# Patient Record
Sex: Female | Born: 1935 | Race: White | Hispanic: No | Smoking: Former smoker
Health system: Southern US, Community
[De-identification: ages and names within clinical notes are randomized; demographics above are authoritative.]

## PROBLEM LIST (undated history)

## (undated) DIAGNOSIS — E041 Nontoxic single thyroid nodule: Secondary | ICD-10-CM

## (undated) DIAGNOSIS — J45909 Unspecified asthma, uncomplicated: Secondary | ICD-10-CM

## (undated) DIAGNOSIS — M1990 Unspecified osteoarthritis, unspecified site: Secondary | ICD-10-CM

## (undated) DIAGNOSIS — K219 Gastro-esophageal reflux disease without esophagitis: Secondary | ICD-10-CM

## (undated) DIAGNOSIS — E039 Hypothyroidism, unspecified: Secondary | ICD-10-CM

## (undated) DIAGNOSIS — C801 Malignant (primary) neoplasm, unspecified: Secondary | ICD-10-CM

## (undated) HISTORY — PX: CATARACT EXTRACTION, BILATERAL: SHX1313

## (undated) HISTORY — PX: ABDOMINAL HYSTERECTOMY: SHX81

## (undated) HISTORY — PX: THYROIDECTOMY, PARTIAL: SHX18

## (undated) HISTORY — DX: Unspecified osteoarthritis, unspecified site: M19.90

---

## 1969-01-09 DIAGNOSIS — J189 Pneumonia, unspecified organism: Secondary | ICD-10-CM

## 1969-01-09 HISTORY — DX: Pneumonia, unspecified organism: J18.9

## 1975-01-10 HISTORY — PX: OTHER SURGICAL HISTORY: SHX169

## 2005-09-12 ENCOUNTER — Ambulatory Visit: Payer: Self-pay | Admitting: Gastroenterology

## 2008-01-08 ENCOUNTER — Ambulatory Visit: Payer: Self-pay | Admitting: Family Medicine

## 2009-07-21 IMAGING — CR DG LUMBAR SPINE 2-3V
1 series · 3 of 3 positions shown · non-contrast
Comparison: none

REASON FOR EXAM: back & right leg pain
COMMENTS:

[Series 2: view not recorded · 0.17mm/px · 3 of 3 slices shown]
[im 1/3]
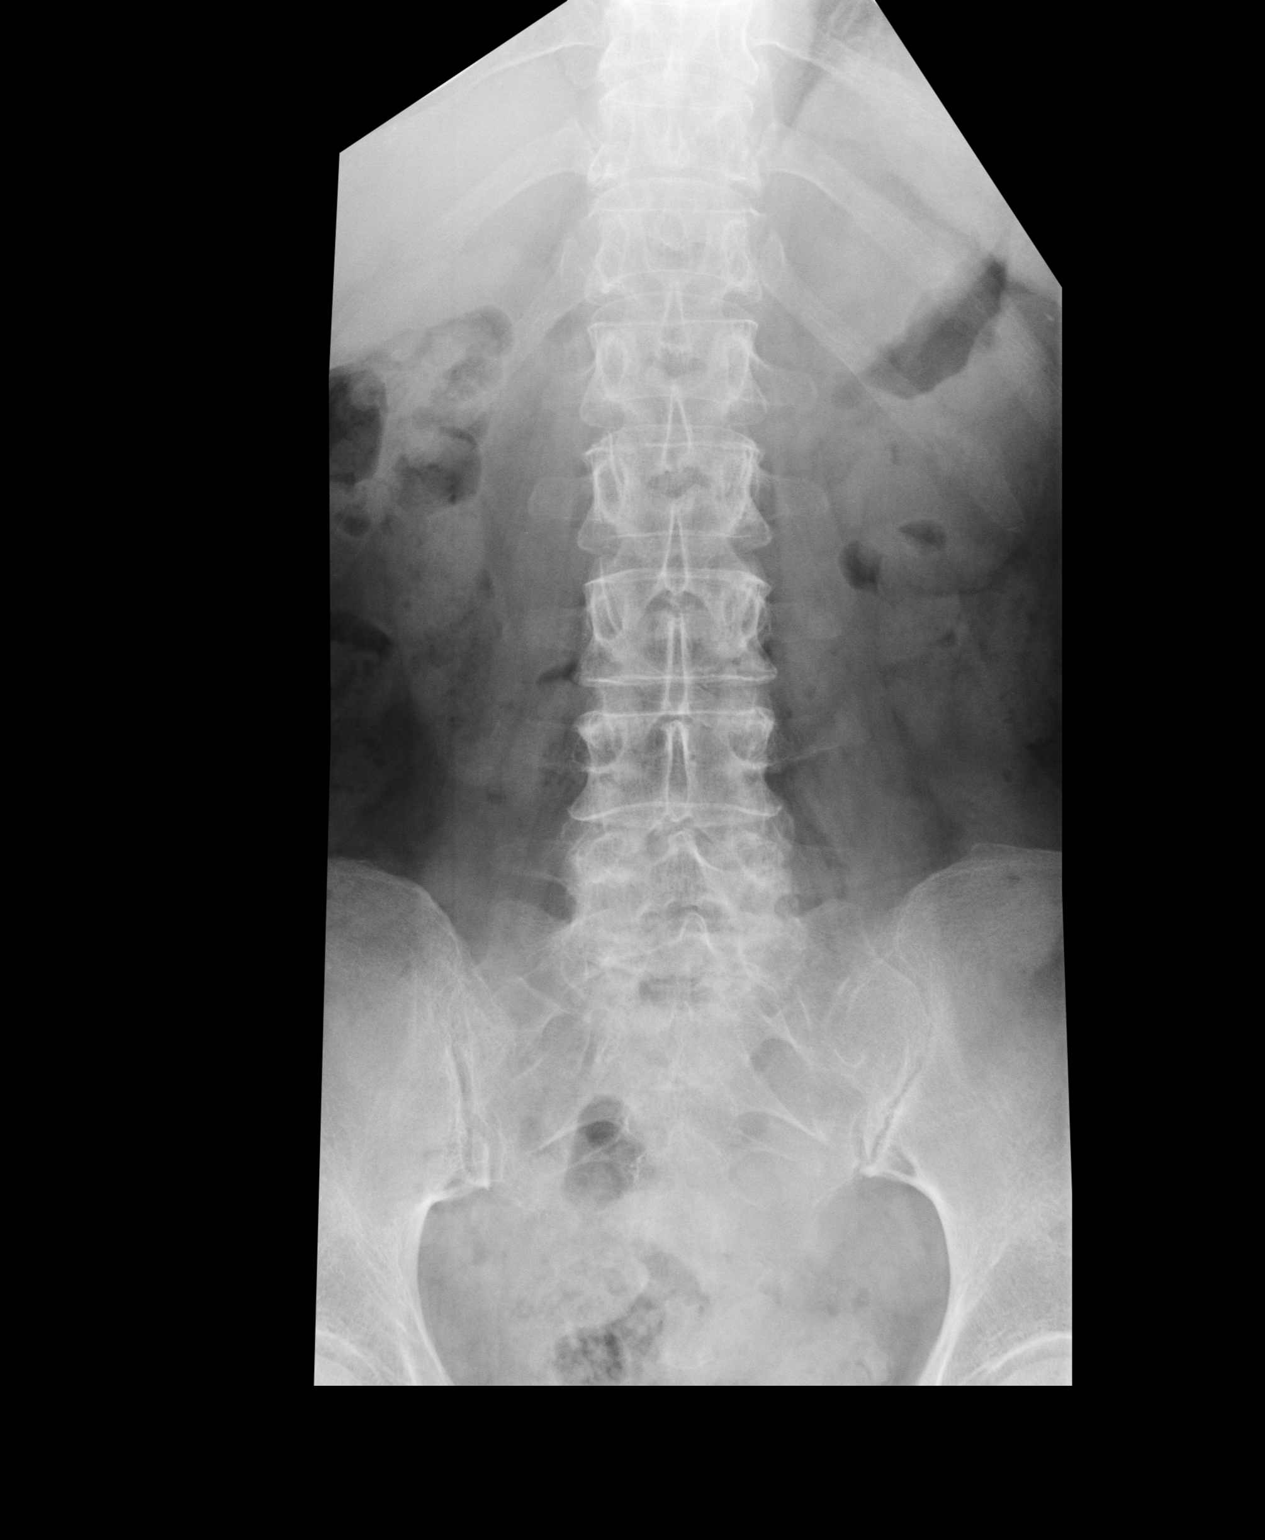
[im 2/3]
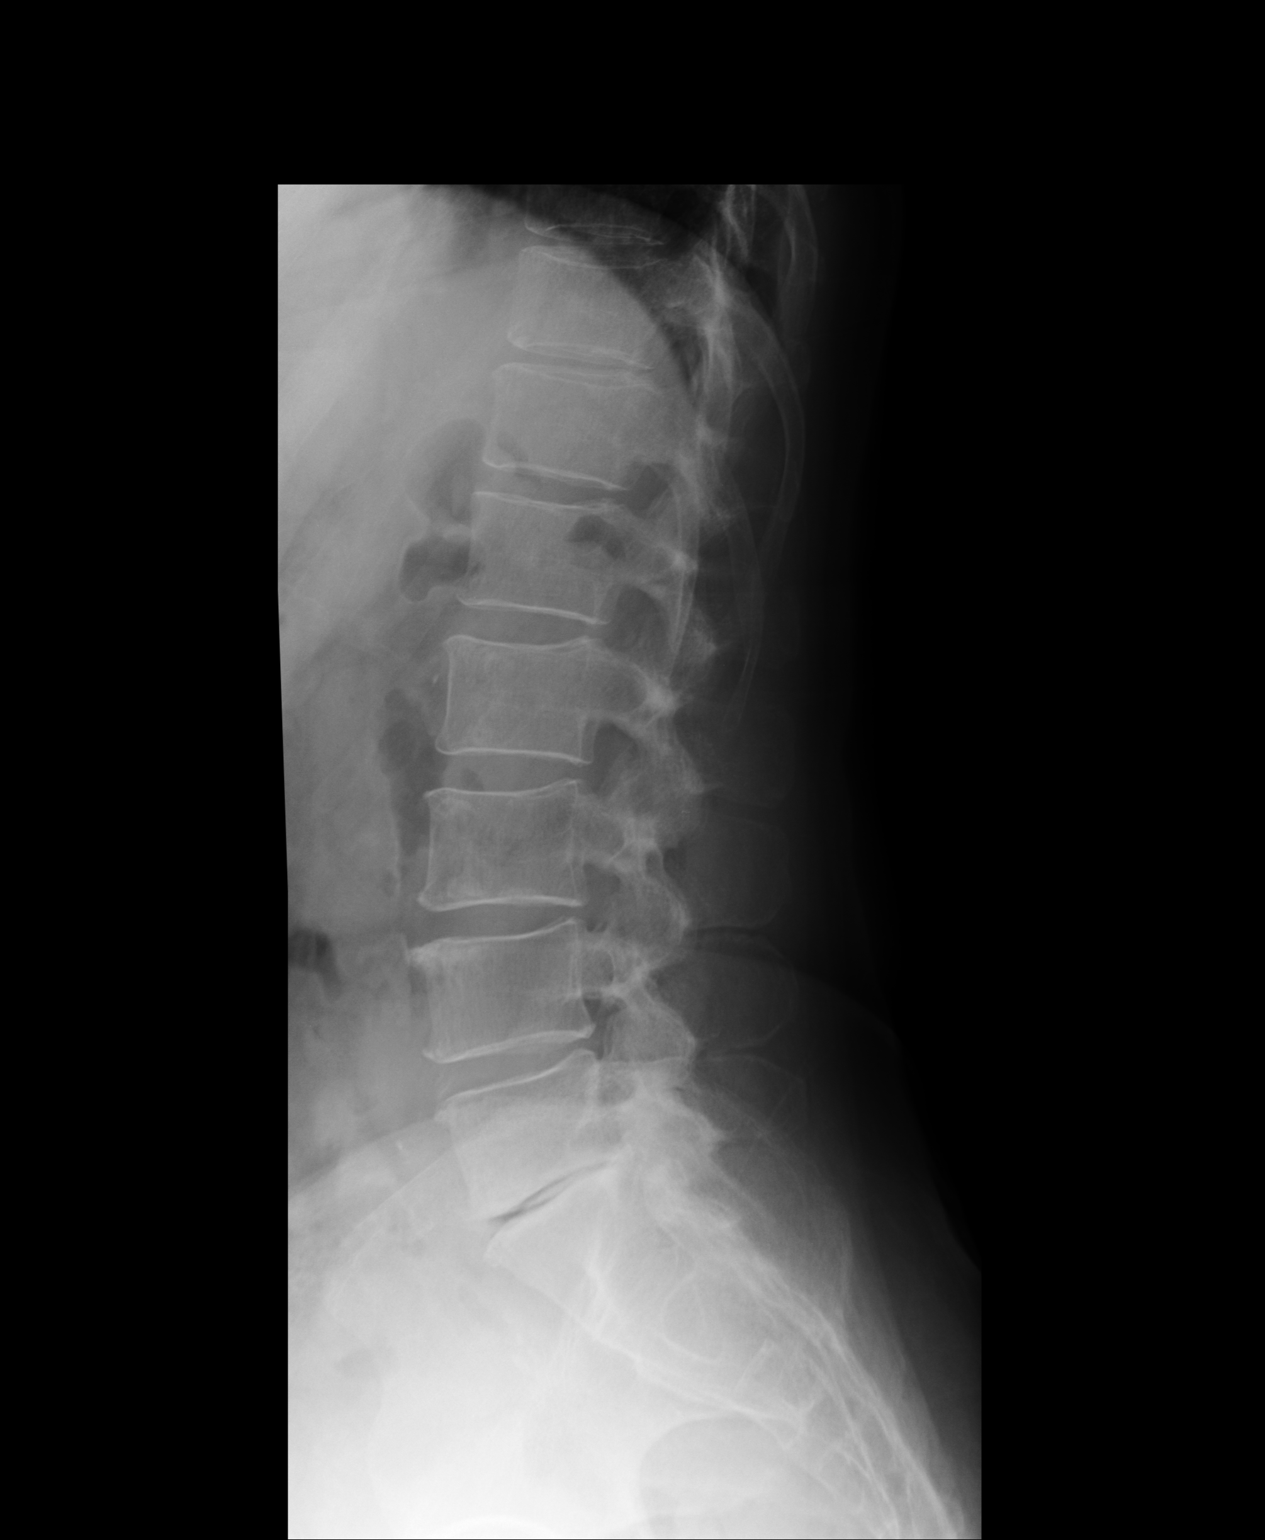
[im 3/3]
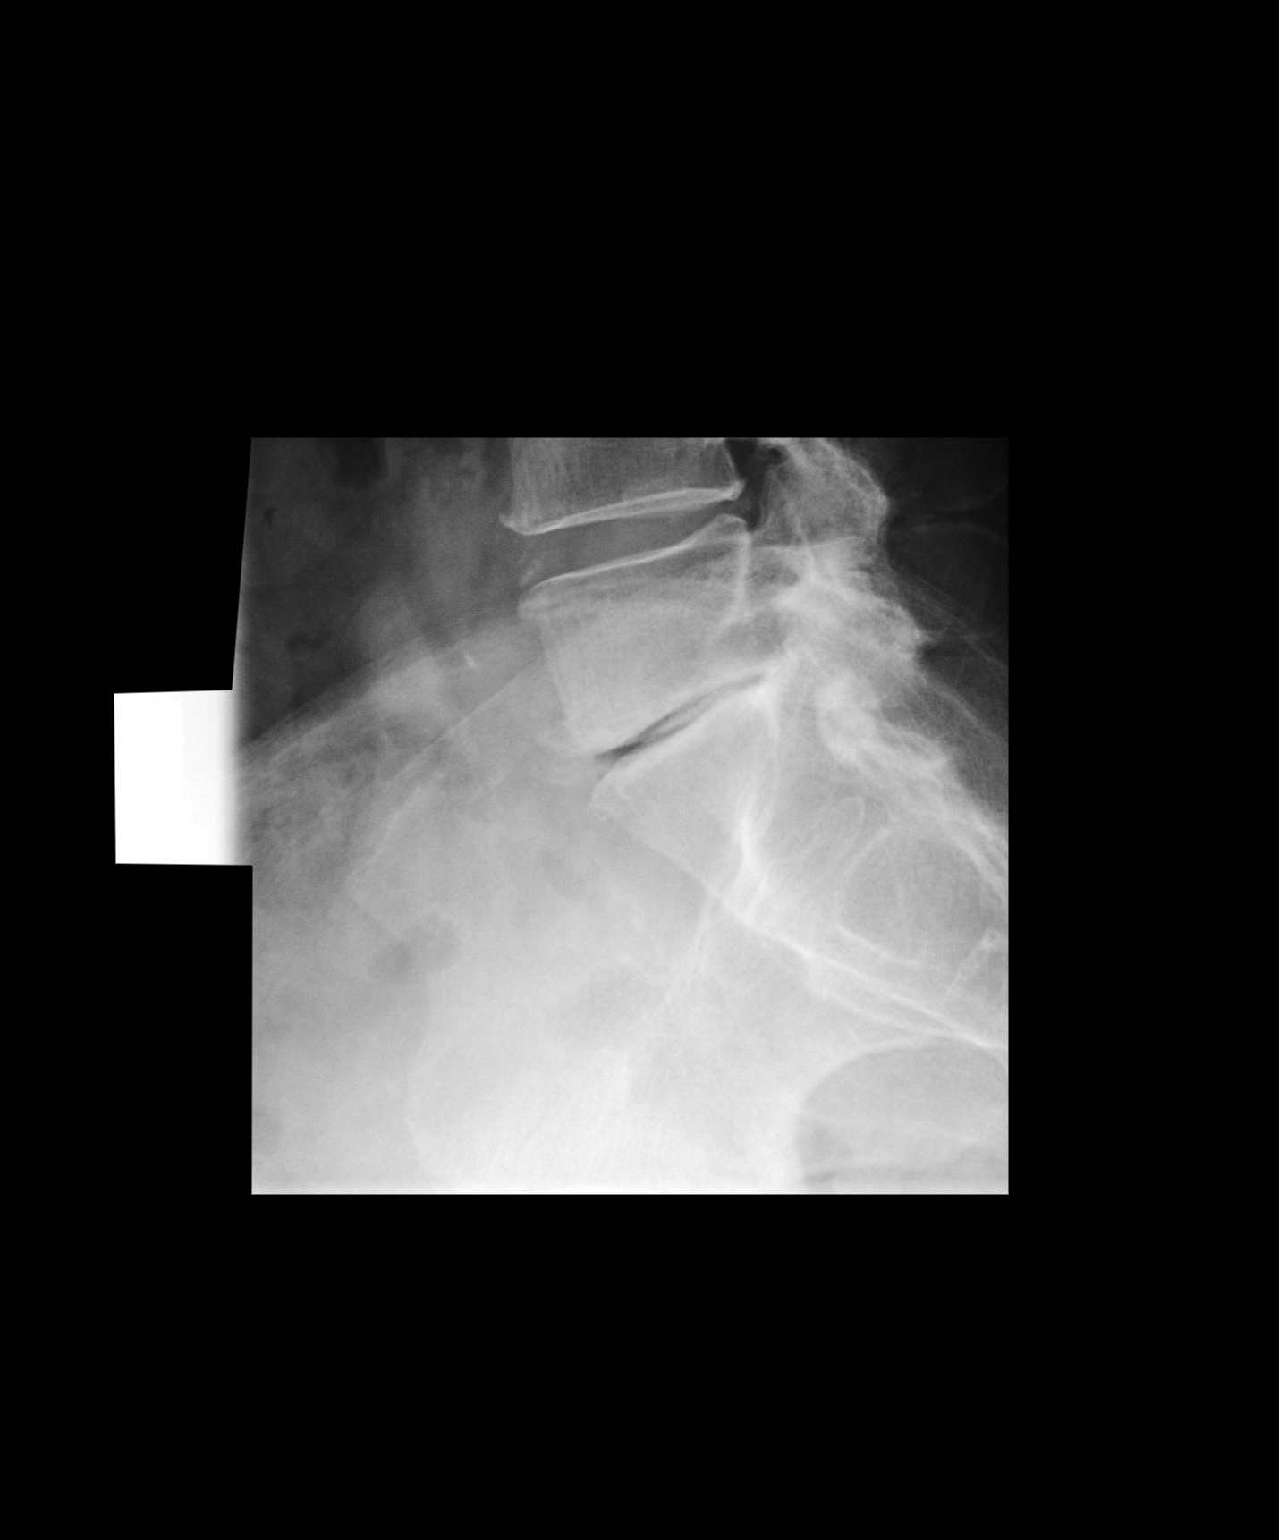

[3 of 3 positions shown; findings below may reference images not displayed]

PROCEDURE:     KDR - KDXR LUMBAR SPINE AP AND LATERAL  - January 08, 2008 [DATE]

RESULT:     The vertebral body heights are well maintained. The vertebral
body alignment is normal. There is narrowing of the L5-S1 intervertebral
disc space consistent with disc disease. This could be further evaluated by
MR if clinically indicated. There is mild degenerative spurring anteriorly
at multiple levels. The pedicles are bilaterally intact. No lytic or blastic
lesions are identified.
IMPRESSION: 1. No fracture is seen.
2. There are changes consistent with disc disease at L5-S1. This could be
further evaluated by MR if clinically indicated.

## 2014-05-26 DIAGNOSIS — I10 Essential (primary) hypertension: Secondary | ICD-10-CM | POA: Insufficient documentation

## 2018-08-19 ENCOUNTER — Other Ambulatory Visit: Payer: Self-pay

## 2018-08-19 ENCOUNTER — Ambulatory Visit
Admission: RE | Admit: 2018-08-19 | Discharge: 2018-08-19 | Disposition: A | Discharge status: Home / Self Care | Payer: Medicare Other | Source: Ambulatory Visit | Attending: Internal Medicine | Admitting: Internal Medicine

## 2018-08-19 ENCOUNTER — Other Ambulatory Visit: Payer: Self-pay | Admitting: Internal Medicine

## 2018-08-19 ENCOUNTER — Other Ambulatory Visit (HOSPITAL_COMMUNITY): Payer: Self-pay | Admitting: Internal Medicine

## 2018-08-19 DIAGNOSIS — R600 Localized edema: Secondary | ICD-10-CM

## 2019-02-11 ENCOUNTER — Other Ambulatory Visit: Payer: Self-pay | Admitting: Rheumatology

## 2019-02-11 DIAGNOSIS — E041 Nontoxic single thyroid nodule: Secondary | ICD-10-CM

## 2019-02-14 ENCOUNTER — Other Ambulatory Visit: Payer: Self-pay

## 2019-02-14 ENCOUNTER — Ambulatory Visit
Admission: RE | Admit: 2019-02-14 | Discharge: 2019-02-14 | Disposition: A | Discharge status: Home / Self Care | Payer: Medicare Other | Source: Ambulatory Visit | Attending: Rheumatology | Admitting: Rheumatology

## 2019-02-14 ENCOUNTER — Ambulatory Visit: Payer: Medicare Other

## 2019-02-14 DIAGNOSIS — E041 Nontoxic single thyroid nodule: Secondary | ICD-10-CM | POA: Insufficient documentation

## 2019-08-18 DIAGNOSIS — E782 Mixed hyperlipidemia: Secondary | ICD-10-CM | POA: Insufficient documentation

## 2019-08-18 DIAGNOSIS — M0579 Rheumatoid arthritis with rheumatoid factor of multiple sites without organ or systems involvement: Secondary | ICD-10-CM | POA: Insufficient documentation

## 2019-10-30 ENCOUNTER — Encounter: Payer: Self-pay | Admitting: General Surgery

## 2019-10-30 ENCOUNTER — Other Ambulatory Visit: Payer: Self-pay

## 2019-10-30 ENCOUNTER — Ambulatory Visit (INDEPENDENT_AMBULATORY_CARE_PROVIDER_SITE_OTHER): Payer: Medicare Other | Admitting: General Surgery

## 2019-10-30 VITALS — BP 133/83 | HR 101 | Temp 98.3°F | Resp 12 | Ht 61.0 in | Wt 118.0 lb

## 2019-10-30 DIAGNOSIS — E041 Nontoxic single thyroid nodule: Secondary | ICD-10-CM

## 2019-10-30 NOTE — Progress Notes (Signed)
Patient ID: [name], female   DOB: September 25, 1933, 86 y.o.   MRN: [id number]  Chief Complaint  Patient presents with  . New Patient (Initial Visit)    Thyroid    HPI [name] is a 86 y.o. female.  She has been referred by Dr. Gabriel Carina for surgical consideration of completion thyroidectomy.  She has a history of prior left thyroid lobectomy when she was in her 84s.  She reports that this was for a benign process.  Around January of this year, she had a Covid vaccine that she says caused a reaction resulting in arthritis.  She was seen by Dr. Jefm Bryant, who discovered a mass in the right lobe of her thyroid.  She had an ultrasound that describes 2 discrete thyroid nodules and a subsequent biopsy in March 2021.  One of the biopsies was benign, while the other showed a follicular neoplasm and Afirma gene expression classification suggested a 50% risk of malignancy.  At that time, Ms. Spaeth did not feel like she wanted to pursue surgery.  She had a recent thyroid ultrasound that demonstrated over 50% increase in size of the nodule.  This, in association with the prior biopsy results, or an indication for surgical management.  She denies any dysphagia or odynophagia.  No frequent throat clearing or pressure in her neck while in a supine position.  She denies any hoarseness or other voice changes, although to me, she does have a slightly breathy vocal quality.  She and the person accompanying her today states this is normal for her.  She denies any heat or cold intolerance.  No heart palpitations or hand tremors.  She denies any changes in the texture of her hair, skin, or fingernails.  No significant diarrhea or constipation.  No fatigue.  No significant weight loss or weight gain.  No family history of thyroid problems or thyroid cancer.  She denies any occupational or therapeutic exposure to head and neck irradiation.  She does not currently take any thyroid hormone replacement.   Past Medical History:   Diagnosis Date  . Arthritis     Past Surgical History:  Procedure Laterality Date  . ABDOMINAL HYSTERECTOMY    . THYROIDECTOMY, PARTIAL Left     History reviewed. No pertinent family history.  Social History Social History   Tobacco Use  . Smoking status: Former Research scientist (life sciences)  . Smokeless tobacco: Never Used  Substance Use Topics  . Alcohol use: Never  . Drug use: Never    Allergies  Allergen Reactions  . Indomethacin Other (See Comments) and Palpitations    Current Outpatient Medications  Medication Sig Dispense Refill  . ascorbic acid (VITAMIN C) 500 MG tablet Take by mouth.    . Calcium Carbonate-Vitamin D 600-200 MG-UNIT TABS Take by mouth.    . cyanocobalamin 1000 MCG tablet Take by mouth.    . folic acid (FOLVITE) 1 MG tablet Take by mouth.    . hydrochlorothiazide (HYDRODIURIL) 25 MG tablet Take 1 tablet by mouth daily.    . meloxicam (MOBIC) 15 MG tablet Take by mouth.    . methotrexate (RHEUMATREX) 2.5 MG tablet Take by mouth.    . Multiple Vitamin (MULTI-VITAMIN) tablet Take 1 tablet by mouth daily.     No current facility-administered medications for this visit.    Review of Systems Review of Systems  Cardiovascular: Positive for leg swelling.  All other systems reviewed and are negative. Or as discussed in the history of present illness.  Blood  pressure 133/83, pulse (!) 101, temperature 98.3 F (36.8 C), resp. rate 12, height '5\' 1"'  (1.549 m), weight 118 lb (53.5 kg), SpO2 95 %.  Physical Exam Physical Exam Vitals reviewed.  Constitutional:      General: She is not in acute distress.    Appearance: Normal appearance. She is normal weight.  HENT:     Head: Normocephalic and atraumatic.     Nose:     Comments: Covered with a mask    Mouth/Throat:     Comments: Covered with a mask Eyes:     General: No scleral icterus.       Right eye: No discharge.        Left eye: No discharge.     Comments: No proptosis or exophthalmos.  No lid lag or stare.   Neck:     Comments: She has a visible right sided thyroid mass.  On palpation, the contour is smooth, but the nodule is fairly firm.  It moves freely with deglutition.  The left lobe of the thyroid is surgically absent.  The trachea is midline and there is no palpable cervical or supraclavicular lymphadenopathy.  She has an old [place] thyroidectomy scar. Cardiovascular:     Rate and Rhythm: Normal rate and regular rhythm.  Pulmonary:     Effort: Pulmonary effort is normal.     Breath sounds: Normal breath sounds.  Abdominal:     General: Bowel sounds are normal.     Palpations: Abdomen is soft.  Genitourinary:    Comments: Deferred Musculoskeletal:        General: No deformity or signs of injury.  Skin:    General: Skin is warm and dry.  Neurological:     General: No focal deficit present.     Mental Status: She is alert and oriented to person, place, and time.     Data Reviewed I personally reviewed the ultrasound performed on February 14, 2019.  I concur with the radiologist report which is copied here:  CLINICAL DATA:  Nodule.  Remote history of left hemithyroidectomy.  EXAM: THYROID ULTRASOUND  TECHNIQUE: Ultrasound examination of the thyroid gland and adjacent soft tissues was performed.  COMPARISON:  None available  FINDINGS: Parenchymal Echotexture: Moderately heterogenous  Isthmus: 0.3 cm thickness  Right lobe: 5.5 x 2.1 x 2.5 cm  Left lobe: Surgically absent  _________________________________________________________  Estimated total number of nodules >/= 1 cm: 2  Number of spongiform nodules >/=  2 cm not described below (TR1): 0  Number of mixed cystic and solid nodules >/= 1.5 cm not described below (TR2): 0  _________________________________________________________  Nodule # 1:  Location: Right; Mid  Maximum size: 2.1 cm; Other 2 dimensions: 1.7 x 1.5 cm  Composition: mixed cystic and solid (1)  Echogenicity:  isoechoic (1)  Shape: not taller-than-wide (0)  Margins: smooth (0)  Echogenic foci: none (0)  ACR TI-RADS total points: 2.  ACR TI-RADS risk category: TR2 (2 points).  ACR TI-RADS recommendations:  This nodule does NOT meet TI-RADS criteria for biopsy or dedicated follow-up.  _________________________________________________________  Nodule # 2:  Location: Right; Inferior  Maximum size: 1.8 cm; Other 2 dimensions: 1.6 x 1.1 cm  Composition: solid/almost completely solid (2)  Echogenicity: isoechoic (1)  Shape: taller-than-wide (3)  Margins: ill-defined (0)  Echogenic foci: none (0)  ACR TI-RADS total points: 6.  ACR TI-RADS risk category: TR4 (4-6 points).  ACR TI-RADS recommendations:  **Given size (>/= 1.5 cm) and appearance, fine needle aspiration of  this moderately suspicious nodule should be considered based on TI-RADS criteria.  _________________________________________________________  0.7 cm spongiform nodule, superior right; This nodule does NOT meet TI-RADS criteria for biopsy or dedicated follow-up.  IMPRESSION: 1. No residual/recurrent tissue post left hemithyroidectomy. 2. Recommend FNA biopsy of moderately suspicious 1.8 cm inferior right nodule.  I was able to review the report of the more recent imaging, performed October 21, 2019.  I am unable to see the actual images.  The report is copied here: Indication: Multinodular goiter   Comparison: None   Technique: Gray-scale and color Doppler images of the neck were obtained.   Findings:  THYROID:  The right lobe of the thyroid measures 5.8 x 2.0 x 2.3 cm.  The left lobe of the thyroid is absent.  The isthmus measures 0.34 cm in AP depth.   Echotexture of the thyroid is homogeneous.   Within the right lobe, there are two nodules:  2.4 x 1.8 x 1.9 cm Mixed solid and cystic, no calcifications  1.6 x 1.4 x 1.4 cm Spongiform, no calcifications   There are no  significantly enlarged lymph nodes in the neck.   Impression:  Homogeneous right lobe of the thyroid gland. Surgically absent left lobe.   There are two right-sided thyroid nodules measuring up to 2.4 cm and 1.6  cm in greatest dimension. No prior imaging available for comparison here,  however consider comparison to outside reports.  Dr. Joycie Peek office forwarded the [facility] cytopathology and molecular diagnostic testing that was performed in March 2021.  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  BRAF mutation was negative and no RET/PTC mutations were detected.  Dr. Joycie Peek clinic notes were also forwarded and these were reviewed.  I concur with her recommendation for completion thyroidectomy.  Assessment This is an 86 year old woman with a remote history of prior left hemithyroidectomy.  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  Based upon the 50% risk of malignancy determined by molecular diagnostic testing, it has been recommended that she undergo completion thyroidectomy.  I concur with this recommendation and have offered her surgical intervention.  Plan The risks of thyroid surgery were discussed, including (but not limited to): bleeding, infection, damage to surrounding structures/tissues, injury (temporary or permanent) to the recurrent laryngeal nerve, hypoparathyroidism (temporary or permanent), need for thyroid hormone replacement therapy, need for additional surgery and/or treatment, recurrence of disease, tracheostomy (temporary or permanent).  The patient had the opportunity to ask any questions and these were answered to their satisfaction.  We will work on getting her operation scheduled.    Fredirick Maudlin 10/30/2019, 3:44 PM

## 2019-10-30 NOTE — H&P (View-Only) (Signed)
Patient ID: [name], female   DOB: [birth year], 87 y.o.   MRN: [id number]  Chief Complaint  Patient presents with  . New Patient (Initial Visit)    Thyroid    HPI [name] is a 86 y.o. female.  She has been referred by Dr. Gabriel Carina for surgical consideration of completion thyroidectomy.  She has a history of prior left thyroid lobectomy when she was in her 23s.  She reports that this was for a benign process.  Around January of this year, she had a Covid vaccine that she says caused a reaction resulting in arthritis.  She was seen by Dr. Jefm Bryant, who discovered a mass in the right lobe of her thyroid.  She had an ultrasound that describes 2 discrete thyroid nodules and a subsequent biopsy in March 2021.  One of the biopsies was benign, while the other showed a follicular neoplasm and Afirma gene expression classification suggested a 50% risk of malignancy.  At that time, [name] did not feel like she wanted to pursue surgery.  She had a recent thyroid ultrasound that demonstrated over 50% increase in size of the nodule.  This, in association with the prior biopsy results, or an indication for surgical management.  She denies any dysphagia or odynophagia.  No frequent throat clearing or pressure in her neck while in a supine position.  She denies any hoarseness or other voice changes, although to me, she does have a slightly breathy vocal quality.  She and the person accompanying her today states this is normal for her.  She denies any heat or cold intolerance.  No heart palpitations or hand tremors.  She denies any changes in the texture of her hair, skin, or fingernails.  No significant diarrhea or constipation.  No fatigue.  No significant weight loss or weight gain.  No family history of thyroid problems or thyroid cancer.  She denies any occupational or therapeutic exposure to head and neck irradiation.  She does not currently take any thyroid hormone replacement.   Past Medical History:   Diagnosis Date  . Arthritis     Past Surgical History:  Procedure Laterality Date  . ABDOMINAL HYSTERECTOMY    . THYROIDECTOMY, PARTIAL Left     History reviewed. No pertinent family history.  Social History Social History   Tobacco Use  . Smoking status: Former Research scientist (life sciences)  . Smokeless tobacco: Never Used  Substance Use Topics  . Alcohol use: Never  . Drug use: Never    Allergies  Allergen Reactions  . Indomethacin Other (See Comments) and Palpitations    Current Outpatient Medications  Medication Sig Dispense Refill  . ascorbic acid (VITAMIN C) 500 MG tablet Take by mouth.    . Calcium Carbonate-Vitamin D 600-200 MG-UNIT TABS Take by mouth.    . cyanocobalamin 1000 MCG tablet Take by mouth.    . folic acid (FOLVITE) 1 MG tablet Take by mouth.    . hydrochlorothiazide (HYDRODIURIL) 25 MG tablet Take 1 tablet by mouth daily.    . meloxicam (MOBIC) 15 MG tablet Take by mouth.    . methotrexate (RHEUMATREX) 2.5 MG tablet Take by mouth.    . Multiple Vitamin (MULTI-VITAMIN) tablet Take 1 tablet by mouth daily.     No current facility-administered medications for this visit.    Review of Systems Review of Systems  Cardiovascular: Positive for leg swelling.  All other systems reviewed and are negative. Or as discussed in the history of present illness.  Blood  pressure 133/83, pulse (!) 101, temperature 98.3 F (36.8 C), resp. rate 12, height '5\' 1"'  (1.549 m), weight 118 lb (53.5 kg), SpO2 95 %.  Physical Exam Physical Exam Vitals reviewed.  Constitutional:      General: She is not in acute distress.    Appearance: Normal appearance. She is normal weight.  HENT:     Head: Normocephalic and atraumatic.     Nose:     Comments: Covered with a mask    Mouth/Throat:     Comments: Covered with a mask Eyes:     General: No scleral icterus.       Right eye: No discharge.        Left eye: No discharge.     Comments: No proptosis or exophthalmos.  No lid lag or stare.   Neck:     Comments: She has a visible right sided thyroid mass.  On palpation, the contour is smooth, but the nodule is fairly firm.  It moves freely with deglutition.  The left lobe of the thyroid is surgically absent.  The trachea is midline and there is no palpable cervical or supraclavicular lymphadenopathy.  She has an old [place] thyroidectomy scar. Cardiovascular:     Rate and Rhythm: Normal rate and regular rhythm.  Pulmonary:     Effort: Pulmonary effort is normal.     Breath sounds: Normal breath sounds.  Abdominal:     General: Bowel sounds are normal.     Palpations: Abdomen is soft.  Genitourinary:    Comments: Deferred Musculoskeletal:        General: No deformity or signs of injury.  Skin:    General: Skin is warm and dry.  Neurological:     General: No focal deficit present.     Mental Status: She is alert and oriented to person, place, and time.     Data Reviewed I personally reviewed the ultrasound performed on February 14, 2019.  I concur with the radiologist report which is copied here:  CLINICAL DATA:  Nodule.  Remote history of left hemithyroidectomy.  EXAM: THYROID ULTRASOUND  TECHNIQUE: Ultrasound examination of the thyroid gland and adjacent soft tissues was performed.  COMPARISON:  None available  FINDINGS: Parenchymal Echotexture: Moderately heterogenous  Isthmus: 0.3 cm thickness  Right lobe: 5.5 x 2.1 x 2.5 cm  Left lobe: Surgically absent  _________________________________________________________  Estimated total number of nodules >/= 1 cm: 2  Number of spongiform nodules >/=  2 cm not described below (TR1): 0  Number of mixed cystic and solid nodules >/= 1.5 cm not described below (TR2): 0  _________________________________________________________  Nodule # 1:  Location: Right; Mid  Maximum size: 2.1 cm; Other 2 dimensions: 1.7 x 1.5 cm  Composition: mixed cystic and solid (1)  Echogenicity:  isoechoic (1)  Shape: not taller-than-wide (0)  Margins: smooth (0)  Echogenic foci: none (0)  ACR TI-RADS total points: 2.  ACR TI-RADS risk category: TR2 (2 points).  ACR TI-RADS recommendations:  This nodule does NOT meet TI-RADS criteria for biopsy or dedicated follow-up.  _________________________________________________________  Nodule # 2:  Location: Right; Inferior  Maximum size: 1.8 cm; Other 2 dimensions: 1.6 x 1.1 cm  Composition: solid/almost completely solid (2)  Echogenicity: isoechoic (1)  Shape: taller-than-wide (3)  Margins: ill-defined (0)  Echogenic foci: none (0)  ACR TI-RADS total points: 6.  ACR TI-RADS risk category: TR4 (4-6 points).  ACR TI-RADS recommendations:  **Given size (>/= 1.5 cm) and appearance, fine needle aspiration of  this moderately suspicious nodule should be considered based on TI-RADS criteria.  _________________________________________________________  0.7 cm spongiform nodule, superior right; This nodule does NOT meet TI-RADS criteria for biopsy or dedicated follow-up.  IMPRESSION: 1. No residual/recurrent tissue post left hemithyroidectomy. 2. Recommend FNA biopsy of moderately suspicious 1.8 cm inferior right nodule.  I was able to review the report of the more recent imaging, performed October 21, 2019.  I am unable to see the actual images.  The report is copied here: Indication: Multinodular goiter   Comparison: None   Technique: Gray-scale and color Doppler images of the neck were obtained.   Findings:  THYROID:  The right lobe of the thyroid measures 5.8 x 2.0 x 2.3 cm.  The left lobe of the thyroid is absent.  The isthmus measures 0.34 cm in AP depth.   Echotexture of the thyroid is homogeneous.   Within the right lobe, there are two nodules:  2.4 x 1.8 x 1.9 cm Mixed solid and cystic, no calcifications  1.6 x 1.4 x 1.4 cm Spongiform, no calcifications   There are no  significantly enlarged lymph nodes in the neck.   Impression:  Homogeneous right lobe of the thyroid gland. Surgically absent left lobe.   There are two right-sided thyroid nodules measuring up to 2.4 cm and 1.6  cm in greatest dimension. No prior imaging available for comparison here,  however consider comparison to outside reports.  Dr. Joycie Peek office forwarded the [facility] cytopathology and molecular diagnostic testing that was performed in March 2021.  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  BRAF mutation was negative and no RET/PTC mutations were detected.  Dr. Joycie Peek clinic notes were also forwarded and these were reviewed.  I concur with her recommendation for completion thyroidectomy.  Assessment This is an 86 year old woman with a remote history of prior left hemithyroidectomy.  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  Based upon the 50% risk of malignancy determined by molecular diagnostic testing, it has been recommended that she undergo completion thyroidectomy.  I concur with this recommendation and have offered her surgical intervention.  Plan The risks of thyroid surgery were discussed, including (but not limited to): bleeding, infection, damage to surrounding structures/tissues, injury (temporary or permanent) to the recurrent laryngeal nerve, hypoparathyroidism (temporary or permanent), need for thyroid hormone replacement therapy, need for additional surgery and/or treatment, recurrence of disease, tracheostomy (temporary or permanent).  The patient had the opportunity to ask any questions and these were answered to their satisfaction.  We will work on getting her operation scheduled.    Fredirick Maudlin 10/30/2019, 3:44 PM

## 2019-10-30 NOTE — Patient Instructions (Signed)
Our surgery scheduler will contact you to schedule your surgery. Please have the [facility] Sheet available when she calls you.  Call the office if you have any questions or concerns.   Thyroidectomy A thyroidectomy is a surgery that is done to remove the thyroid gland. The thyroid is a butterfly-shaped gland that is located at the lower front of your neck. It produces thyroid hormone, which is a substance that helps to control certain body processes. You may have a:  Total thyroidectomy. All of your thyroid is removed.  Thyroid lobectomy. Part of your thyroid is removed. The amount of thyroid gland tissue that is removed during your surgery depends on the reason for the procedure. Reasons to have this procedure include treatment for:  Thyroid nodules.  Thyroid cancer.  Benign thyroid tumors.  Goiter.  Overactive thyroid gland (hyperthyroidism). There are two ways to do this procedure. Conventional, or open, thyroidectomy uses one large incision to remove the thyroid gland. This is the most common method. Endoscopic thyroidectomy, a less invasive method, uses a narrow tube with a light and camera (endoscope) to remove the gland. Tell a health care provider about:  Any allergies you have.  All medicines you are taking, including vitamins, herbs, eye drops, creams, and over-the-counter medicines.  Any problems you or family members have had with anesthetic medicines.  Any blood disorders you have.  Any surgeries you have had.  Any medical conditions you have.  Whether you are pregnant or may be pregnant. What are the risks? Generally, this is a safe procedure. However, problems may occur, including:  Damage to the parathyroid glands. These are located behind your thyroid gland. They maintain the calcium levels in the body. Damage may lead to: ? A decrease in parathyroid hormone levels (hypoparathyroidism). ? A decrease in calcium levels. This will make your nerves irritable and may  cause muscle spasms.  An increase in thyroid hormone.  Damage to the nerves of your voice box (larynx). This can be temporary or long-term (rare).  Hoarseness. This usually resolves in 24-48 hours.  Bleeding.  Infection. What happens before the procedure? Staying hydrated Follow instructions from your health care provider about hydration, which may include:  Up to 2 hours before the procedure - you may continue to drink clear liquids, such as water, clear fruit juice, black coffee, and plain tea. Eating and drinking restrictions Follow instructions from your health care provider about eating and drinking, which may include:  8 hours before the procedure - stop eating heavy meals or foods such as meat, fried foods, or fatty foods.  6 hours before the procedure - stop eating light meals or foods, such as toast or cereal.  6 hours before the procedure - stop drinking milk or drinks that contain milk.  2 hours before the procedure - stop drinking clear liquids. Medicines Ask your health care provider about:  Changing or stopping your regular medicines. This is especially important if you are taking diabetes medicines or blood thinners.  Taking medicines such as aspirin and ibuprofen. These medicines can thin your blood. Do not take these medicines unless your health care provider tells you to take them.  Taking over-the-counter medicines, vitamins, herbs, and supplements. General instructions  You may be asked to shower with a germ-killing soap.  Plan to have someone take you home from the hospital or clinic.  Plan to have a responsible adult care for you for at least 24 hours after you leave the hospital or clinic. This is  important. What happens during the procedure?  To reduce your risk of infection: ? Your health care team will wash or sanitize their hands. ? Hair may be removed from the surgical area. ? Your skin will be washed with soap.  An IV will be inserted into  one of your veins.  You will be given one or more of the following: ? A medicine to help you relax (sedative). ? A medicine to make you fall asleep (general anesthetic).  Your health care provider will perform your surgery using one of two methods: ? For open thyroidectomy, an incision will be made in your lower neck. Muscles in the area will be separated to reveal your thyroid gland. ? For endoscopic thyroidectomy, several small incisions will be made in your neck, chest, or armpit. An endoscopewill be inserted into an incision.  Your health care provider may monitor laryngeal nerve function during the procedure for safety reasons.  Part or all of your thyroid gland will be removed.  A tube (drain) may be placed at the incision site to drain blood and fluids that accumulate under the skin after the procedure. The drain may have to stay in place for a day or two after the procedure.  The incision will be closed with stitches (sutures).  A dressing will be placed over your incision. The procedure may vary among health care providers and hospitals. What happens after the procedure?  Your blood pressure, heart rate, breathing rate, and blood oxygen level will be monitored often until the medicines you were given have worn off.  You will be given pain medicine as needed.  Your provider will check your ability to talk and swallow after the procedure.  You will gradually start to drink liquids and have soft foods as tolerated.  You may have a blood test to check the level of calcium in your body.  If you had a drain put in during the procedure, it will usually be removed the next day. Summary  A thyroidectomy is a surgery that is done to remove the thyroid gland.  The procedure will be done in one of two ways: conventional, or open, thyroidectomy or endoscopic thyroidectomy.  Serious complications are rare.  Plan to have a responsible adult care for you for at least 24 hours after  you leave the hospital or clinic. This is important. This information is not intended to replace advice given to you by your health care provider. Make sure you discuss any questions you have with your health care provider. Document Revised: 12/08/2016 Document Reviewed: 10/31/2016 Elsevier Patient Education  2020 [employer].

## 2019-11-03 ENCOUNTER — Telehealth: Payer: Self-pay | Admitting: General Surgery

## 2019-11-03 NOTE — Telephone Encounter (Signed)
Incoming call from patient.   She is now informed of all dates regarding her surgery and voices understanding.

## 2019-11-03 NOTE — Telephone Encounter (Signed)
Outgoing call is made, left message for patient to call.  Please advise patient of Pre-Admission date/time, COVID Testing date and Surgery date.  Surgery Date: 11/28/19 Preadmission Testing Date: 11/18/19 (phone 1p-5p) Covid Testing Date: 11/26/19 - patient advised to go to the [place] ([place] between [place] patient needs to call at [phone], between 1-3:00pm the day before surgery, to find out what time to arrive for surgery.

## 2019-11-18 ENCOUNTER — Encounter
Admission: RE | Admit: 2019-11-18 | Discharge: 2019-11-18 | Disposition: A | Discharge status: Home / Self Care | Payer: Medicare Other | Source: Ambulatory Visit | Attending: General Surgery | Admitting: General Surgery

## 2019-11-18 ENCOUNTER — Other Ambulatory Visit: Payer: Self-pay

## 2019-11-18 DIAGNOSIS — Z01812 Encounter for preprocedural laboratory examination: Secondary | ICD-10-CM | POA: Diagnosis present

## 2019-11-18 HISTORY — DX: Unspecified asthma, uncomplicated: J45.909

## 2019-11-18 HISTORY — DX: Nontoxic single thyroid nodule: E04.1

## 2019-11-18 NOTE — Patient Instructions (Addendum)
Your procedure is scheduled on:11-28-19 FRIDAY Report to the Registration Desk on the 1st floor of the [facility]-Then proceed to the Surgery Desk on the 2nd floor To find out your arrival time, please call [phone] between 1PM - 3PM on:11-27-19 THURSDAY  REMEMBER: Instructions that are not followed completely may result in serious medical risk, up to and including death; or upon the discretion of your surgeon and anesthesiologist your surgery may need to be rescheduled.  Do not eat food after midnight the night before surgery.  No gum chewing, lozengers or hard candies.  You may however, drink CLEAR liquids up to 2 hours before you are scheduled to arrive for your surgery. Do not drink anything within 2 hours of your scheduled arrival time.  Clear liquids include: - water  - apple juice without pulp - gatorade (not RED, PURPLE, OR BLUE) - black coffee or tea (Do NOT add milk or creamers to the coffee or tea) Do NOT drink anything that is not on this list.   TAKE THESE MEDICATIONS THE MORNING OF SURGERY WITH A SIP OF WATER: -NONE  One week prior to surgery: Stop Anti-inflammatories (NSAIDS) such as MELOXICAM (MOBIC) Advil, Aleve, Ibuprofen, Motrin, Naproxen, Naprosyn and Aspirin based products such as Excedrin, Goodys Powder, BC Powder-OK TO TAKE TYLENOL IF NEEDED  Stop ANY OVER THE COUNTER supplements until after surgery-STOP YOUR TURMERIC AND VITAMIN C-YOU MAY RESUME AFTER SURGERY (However, you may continue taking Calcium-Vitamin D, Vitamin B, folic acid and multivitamin up until the day before surgery.)  No Alcohol for 24 hours before or after surgery.  No Smoking including e-cigarettes for 24 hours prior to surgery.  No chewable tobacco products for at least 6 hours prior to surgery.  No nicotine patches on the day of surgery.  Do not use any "recreational" drugs for at least a week prior to your surgery.  Please be advised that the combination of cocaine and  anesthesia may have negative outcomes, up to and including death. If you test positive for cocaine, your surgery will be cancelled.  On the morning of surgery brush your teeth with toothpaste and water, you may rinse your mouth with mouthwash if you wish. Do not swallow any toothpaste or mouthwash.  Do not wear jewelry, make-up, hairpins, clips or nail polish.  Do not wear lotions, powders, or perfumes.   Do not shave 48 hours prior to surgery.   Contact lenses, hearing aids and dentures may not be worn into surgery.  Do not bring valuables to the hospital. [facility] is not responsible for any missing/lost belongings or valuables.   Use CHG wipes as directed on instruction sheet.  Notify your doctor if there is any change in your medical condition (cold, fever, infection).  Wear comfortable clothing (specific to your surgery type) to the hospital.  Plan for stool softeners for home use; pain medications have a tendency to cause constipation. You can also help prevent constipation by eating foods high in fiber such as fruits and vegetables and drinking plenty of fluids as your diet allows.  After surgery, you can help prevent lung complications by doing breathing exercises.  Take deep breaths and cough every 1-2 hours. Your doctor may order a device called an Incentive Spirometer to help you take deep breaths. When coughing or sneezing, hold a pillow firmly against your incision with both hands. This is called "splinting." Doing this helps protect your incision. It also decreases belly discomfort.  If you are being admitted to  the hospital overnight, leave your suitcase in the car. After surgery it may be brought to your room.  If you are being discharged the day of surgery, you will not be allowed to drive home. You will need a responsible adult (18 years or older) to drive you home and stay with you that night.   If you are taking public transportation, you will need to have a  responsible adult (18 years or older) with you. Please confirm with your physician that it is acceptable to use public transportation.   Please call the [place] [facility] at [phone] if you have any questions about these instructions.  Visitation Policy:  Patients undergoing a surgery or procedure may have one family member or support person with them as long as that person is not COVID-19 positive or experiencing its symptoms.  That person may remain in the waiting area during the procedure.  Inpatient Visitation Update:   In an effort to ensure the safety of our team members and our patients, we are implementing a change to our visitation policy:  Effective Monday, Aug. 9, at 7 a.m., inpatients will be allowed one support person.  o The support person may change daily.  o The support person must pass our screening, gel in and out, and wear a mask at all times, including in the patient's room.  o Patients must also wear a mask when staff or their support person are in the room.  o Masking is required regardless of vaccination status.  Systemwide, no visitors 17 or younger.

## 2019-11-18 NOTE — Pre-Procedure Instructions (Signed)
Care Everywhere Labs History Expand AllCollapse All CBC [place] 10/28/2019 Component    WBC (White Blood Cell Count)  RBC (Red Blood Cell Count)  Hemoglobin  Hematocrit  MCV (Mean Corpuscular Volume)  MCH (Mean Corpuscular Hemoglobin)  MCHC (Mean Corpuscular Hemoglobin Concentration)  RDW-CV (Red Cell Distribution Width)  MPV (Mean Platelet Volume)  Immature Granulocyte Count  Component 10/28/2019 07/22/2019 05/21/2019 03/10/2019 02/10/2019 02/07/2019 08/19/2018           WBC (White Blood Cell Count) 5.5 6.6 6.8 8.4 12.1High   7.2 5.1  RBC (Red Blood Cell Count) 4.22 4.06 4.13 4.31 4.33 4.26 4.57  Hemoglobin 13.9 13.2 13.2 13.2 13.1 12.9 14.5  Hematocrit 42.7 40.1 40.9 41.5 40.4 39.8 43.3  MCV (Mean Corpuscular Volume) 101.2High   98.8 99.0 96.3 93.3 93.4 94.7  MCH (Mean Corpuscular Hemoglobin) 32.9High   32.5High   32.0High   30.6 30.3 30.3 31.7High    MCHC (Mean Corpuscular Hemoglobin Concentration) 32.6 32.9 32.3 31.8Low   32.4 32.4 33.5  RDW-CV (Red Cell Distribution Width) 13.6 14.6 15.6High   14.5 12.3 12.6 12.8  MPV (Mean Platelet Volume) 10.3 10.3 10.5 11.0 10.4 9.9 10.6  Immature Granulocyte Count 0.02 0.02 0.01 0.03 0.15High   0.02 0.01  CHEM PROFILE [place] 10/28/2019 Component    Glucose  Sodium  Potassium  Chloride  Carbon Dioxide (CO2)  Urea Nitrogen (BUN)  Creatinine  Glomerular Filtration Rate (eGFR), MDRD Estimate  Calcium  AST   ALT   Alk Phos (alkaline Phosphatase)  Albumin  Bilirubin, Total  Protein, Total  A/G Ratio  Component 10/28/2019 10/28/2019 10/28/2019 10/28/2019 08/18/2019 07/22/2019 07/22/2019 07/22/2019 07/22/2019 05/21/2019 05/21/2019 05/21/2019 05/21/2019 03/10/2019 03/10/2019 03/10/2019 03/10/2019 02/10/2019 02/10/2019 02/10/2019 02/10/2019 08/19/2018                          Glucose -- -- -- -- 94 -- -- -- -- -- -- -- -- -- -- -- -- -- -- -- -- 95  Sodium --  -- -- -- 141 -- -- -- -- -- -- -- -- -- -- -- -- -- -- -- -- 141  Potassium -- -- -- -- 4.3 -- -- -- -- -- -- -- -- -- -- -- -- -- -- -- -- 4.5  Chloride -- -- -- -- 104 -- -- -- -- -- -- -- -- -- -- -- -- -- -- -- -- 105  Carbon Dioxide (CO2) -- -- -- -- 29.1 -- -- -- -- -- -- -- -- -- -- -- -- -- -- -- -- 30.5  Urea Nitrogen (BUN) -- -- -- -- 31High   -- -- -- -- -- -- -- -- -- -- -- -- -- -- -- -- 17  Creatinine -- -- 0.8 -- 0.8 -- -- 0.9 -- -- -- 0.9 -- -- -- 0.7 -- -- -- 0.7 -- 0.7  Glomerular Filtration Rate (eGFR), MDRD Estimate -- -- 68 -- 68 -- -- 60Low   -- -- -- 60Low   -- -- -- 80 -- -- -- 80 -- 80  Calcium -- -- -- -- 9.6 -- -- -- -- -- -- -- -- -- -- -- -- -- -- -- -- 9.4  AST  28 -- -- -- 20 22 -- -- -- 17 -- -- -- 17 -- -- -- 16 -- -- -- 23  ALT  -- 43High   -- -- 30 -- 33 -- -- -- 27 -- -- --  23 -- -- -- 21 -- -- 30  Alk Phos (alkaline Phosphatase) -- -- -- -- 80 -- -- -- -- -- -- -- -- -- -- -- -- -- -- -- -- 78  Albumin -- -- -- 4.1 3.7 -- -- -- 3.7 -- -- -- 3.8 -- -- -- 3.7 -- -- -- 3.7 3.9  Bilirubin, Total -- -- -- -- 0.3 -- -- -- -- -- -- -- -- -- -- -- -- -- -- -- -- 0.3  Protein, Total -- -- -- -- 6.1 -- -- -- -- -- -- -- -- -- -- -- -- -- -- -- -- 6.7  A/G Ratio -- -- -- -- 1.5 -- -- -- -- -- -- -- -- -- -- -- -- -- -- -- -- 1.4  DIFFERENTIAL [place] 10/28/2019 Component    Neutrophils  Lymphocytes  Monocytes  Eosinophils  Basophils  Neutrophil %  Lymphocyte %  Monocyte %  Eosinophil %  Basophil%  Immature Granulocyte %  Component 10/28/2019 07/22/2019 05/21/2019 03/10/2019 02/10/2019 02/07/2019 08/19/2018           Neutrophils 3.76 4.63 4.76 6.61 9.81High   5.10 3.30  Lymphocytes 1.03 1.01 1.14 1.27 1.13 1.27 1.18  Monocytes 0.34 0.52 0.56 0.38 0.93 0.51 0.35  Eosinophils 0.27 0.38 0.31 0.04 0.01 0.29 0.25  Basophils 0.04 0.05 0.05 0.04 0.03 0.02 0.05  Neutrophil % 68.9 70.0 69.8 78.9High   81.4High    70.7High   64.1  Lymphocyte % 18.9 15.3 16.7 15.2 9.4Low   17.6 23.0  Monocyte % 6.2 7.9 8.2 4.5 7.7 7.1 6.8  Eosinophil % 4.9 5.7High   4.5 0.5Low   0.1Low   4.0 4.9  Basophil% 0.7 0.8 0.7 0.5 0.2 0.3 1.0  Immature Granulocyte % 0.4 0.3 0.1 0.4

## 2019-11-21 ENCOUNTER — Encounter
Admission: RE | Admit: 2019-11-21 | Discharge: 2019-11-21 | Disposition: A | Discharge status: Home / Self Care | Payer: Medicare Other | Source: Ambulatory Visit | Attending: General Surgery | Admitting: General Surgery

## 2019-11-21 ENCOUNTER — Other Ambulatory Visit: Payer: Self-pay

## 2019-11-21 DIAGNOSIS — Z01812 Encounter for preprocedural laboratory examination: Secondary | ICD-10-CM | POA: Diagnosis not present

## 2019-11-21 LAB — POTASSIUM: Potassium: 3.5 mmol/L (ref 3.5–5.1)

## 2019-11-26 ENCOUNTER — Other Ambulatory Visit: Payer: Self-pay

## 2019-11-26 ENCOUNTER — Other Ambulatory Visit
Admission: RE | Admit: 2019-11-26 | Discharge: 2019-11-26 | Disposition: A | Discharge status: Home / Self Care | Payer: Medicare Other | Source: Ambulatory Visit | Attending: General Surgery | Admitting: General Surgery

## 2019-11-26 DIAGNOSIS — Z20822 Contact with and (suspected) exposure to covid-19: Secondary | ICD-10-CM | POA: Insufficient documentation

## 2019-11-26 DIAGNOSIS — Z01812 Encounter for preprocedural laboratory examination: Secondary | ICD-10-CM | POA: Insufficient documentation

## 2019-11-26 LAB — SARS CORONAVIRUS 2 (TAT 6-24 HRS): SARS Coronavirus 2: NEGATIVE

## 2019-11-27 MED ORDER — ORAL CARE MOUTH RINSE: 15.0000 mL | Freq: Once | OROMUCOSAL | Status: AC

## 2019-11-27 MED ORDER — CHLORHEXIDINE GLUCONATE 0.12 % MT SOLN: 15.0000 mL | Freq: Once | OROMUCOSAL | Status: AC

## 2019-11-27 MED ORDER — LACTATED RINGERS IV SOLN: INTRAVENOUS | Status: DC

## 2019-11-28 ENCOUNTER — Other Ambulatory Visit: Payer: Self-pay

## 2019-11-28 ENCOUNTER — Encounter
Admission: RE | Disposition: A | Discharge status: Home / Self Care | Payer: Self-pay | Source: Home / Self Care | Attending: General Surgery

## 2019-11-28 ENCOUNTER — Encounter: Payer: Self-pay | Admitting: General Surgery

## 2019-11-28 ENCOUNTER — Observation Stay
Admission: RE | Admit: 2019-11-28 | Discharge: 2019-11-29 | Disposition: A | Discharge status: Home / Self Care | Payer: Medicare Other | Attending: General Surgery | Admitting: General Surgery

## 2019-11-28 ENCOUNTER — Ambulatory Visit: Payer: Medicare Other | Admitting: Certified Registered"

## 2019-11-28 DIAGNOSIS — E041 Nontoxic single thyroid nodule: Secondary | ICD-10-CM | POA: Diagnosis not present

## 2019-11-28 DIAGNOSIS — Z79899 Other long term (current) drug therapy: Secondary | ICD-10-CM | POA: Diagnosis not present

## 2019-11-28 DIAGNOSIS — Z87891 Personal history of nicotine dependence: Secondary | ICD-10-CM | POA: Diagnosis not present

## 2019-11-28 DIAGNOSIS — E890 Postprocedural hypothyroidism: Secondary | ICD-10-CM | POA: Diagnosis not present

## 2019-11-28 LAB — CALCIUM: Calcium: 8.1 mg/dL — ABNORMAL LOW (ref 8.9–10.3)

## 2019-11-28 LAB — ALBUMIN: Albumin: 3.3 g/dL — ABNORMAL LOW (ref 3.5–5.0)

## 2019-11-28 SURGERY — THYROIDECTOMY, COMPLETION
Anesthesia: General | Laterality: Right

## 2019-11-28 MED ORDER — CHLORHEXIDINE GLUCONATE CLOTH 2 % EX PADS
6.0000 | MEDICATED_PAD | Freq: Once | CUTANEOUS | Status: AC
  Administered 2019-11-28: 6 via TOPICAL

## 2019-11-28 MED ORDER — TRAMADOL HCL 50 MG PO TABS: 50.0000 mg | ORAL_TABLET | Freq: Four times a day (QID) | ORAL | Status: DC | PRN

## 2019-11-28 MED ORDER — EPHEDRINE SULFATE 50 MG/ML IJ SOLN
INTRAMUSCULAR | Status: DC | PRN
  Administered 2019-11-28 (×2): 5 mg via INTRAVENOUS

## 2019-11-28 MED ORDER — TURMERIC 500 MG PO CAPS
1000.0000 mg | ORAL_CAPSULE | Freq: Every day | ORAL | Status: DC
Start: 2019-11-29 — End: 2019-11-28

## 2019-11-28 MED ORDER — ASCORBIC ACID 500 MG PO TABS
1000.0000 mg | ORAL_TABLET | Freq: Every day | ORAL | Status: DC
  Administered 2019-11-29: 1000 mg via ORAL
  Filled 2019-11-28: qty 2

## 2019-11-28 MED ORDER — CALCIUM CARBONATE 1250 (500 CA) MG PO TABS
500.0000 mg | ORAL_TABLET | Freq: Three times a day (TID) | ORAL | Status: DC
  Administered 2019-11-28 – 2019-11-29 (×2): 500 mg via ORAL
  Filled 2019-11-28 (×4): qty 1

## 2019-11-28 MED ORDER — ONDANSETRON HCL 4 MG/2ML IJ SOLN
INTRAMUSCULAR | Status: DC | PRN
  Administered 2019-11-28: 4 mg via INTRAVENOUS

## 2019-11-28 MED ORDER — PROPOFOL 10 MG/ML IV BOLUS
INTRAVENOUS | Status: DC | PRN
  Administered 2019-11-28: 80 mg via INTRAVENOUS

## 2019-11-28 MED ORDER — ONDANSETRON HCL 4 MG/2ML IJ SOLN
INTRAMUSCULAR | Status: AC
  Filled 2019-11-28: qty 4

## 2019-11-28 MED ORDER — FENTANYL CITRATE (PF) 100 MCG/2ML IJ SOLN
25.0000 ug | INTRAMUSCULAR | Status: DC | PRN
  Administered 2019-11-28: 25 ug via INTRAVENOUS

## 2019-11-28 MED ORDER — MENTHOL 3 MG MT LOZG
1.0000 | LOZENGE | OROMUCOSAL | Status: DC | PRN
  Filled 2019-11-28: qty 9

## 2019-11-28 MED ORDER — PROPOFOL 10 MG/ML IV BOLUS
INTRAVENOUS | Status: AC
  Filled 2019-11-28: qty 20

## 2019-11-28 MED ORDER — SUCCINYLCHOLINE CHLORIDE 200 MG/10ML IV SOSY
PREFILLED_SYRINGE | INTRAVENOUS | Status: AC
  Filled 2019-11-28: qty 20

## 2019-11-28 MED ORDER — CHLORHEXIDINE GLUCONATE CLOTH 2 % EX PADS: 6.0000 | MEDICATED_PAD | Freq: Once | CUTANEOUS | Status: DC

## 2019-11-28 MED ORDER — SODIUM CHLORIDE 0.9 % IV SOLN
INTRAVENOUS | Status: DC | PRN
  Administered 2019-11-28: .15 ug/kg/min via INTRAVENOUS
  Administered 2019-11-28: .1 ug/kg/min via INTRAVENOUS

## 2019-11-28 MED ORDER — LEVOTHYROXINE SODIUM 50 MCG PO TABS
75.0000 ug | ORAL_TABLET | Freq: Every day | ORAL | Status: DC
  Administered 2019-11-29: 75 ug via ORAL
  Filled 2019-11-28: qty 2

## 2019-11-28 MED ORDER — HYDROCHLOROTHIAZIDE 25 MG PO TABS
25.0000 mg | ORAL_TABLET | Freq: Every day | ORAL | Status: DC
  Administered 2019-11-29: 25 mg via ORAL
  Filled 2019-11-28: qty 1

## 2019-11-28 MED ORDER — GLYCOPYRROLATE 0.2 MG/ML IJ SOLN
INTRAMUSCULAR | Status: DC | PRN
  Administered 2019-11-28: .2 mg via INTRAVENOUS

## 2019-11-28 MED ORDER — LIDOCAINE HCL (CARDIAC) PF 100 MG/5ML IV SOSY
PREFILLED_SYRINGE | INTRAVENOUS | Status: DC | PRN
  Administered 2019-11-28: 60 mg via INTRAVENOUS

## 2019-11-28 MED ORDER — REMIFENTANIL HCL 1 MG IV SOLR
INTRAVENOUS | Status: AC
  Filled 2019-11-28: qty 1000

## 2019-11-28 MED ORDER — DEXMEDETOMIDINE (PRECEDEX) IN NS 20 MCG/5ML (4 MCG/ML) IV SYRINGE
PREFILLED_SYRINGE | INTRAVENOUS | Status: DC | PRN
  Administered 2019-11-28: 4 ug via INTRAVENOUS

## 2019-11-28 MED ORDER — ONDANSETRON HCL 4 MG/2ML IJ SOLN: 4.0000 mg | Freq: Four times a day (QID) | INTRAMUSCULAR | Status: DC | PRN

## 2019-11-28 MED ORDER — SUCCINYLCHOLINE CHLORIDE 20 MG/ML IJ SOLN
INTRAMUSCULAR | Status: DC | PRN
  Administered 2019-11-28: 80 mg via INTRAVENOUS

## 2019-11-28 MED ORDER — ADULT MULTIVITAMIN W/MINERALS CH
1.0000 | ORAL_TABLET | Freq: Every day | ORAL | Status: DC
  Administered 2019-11-29: 1 via ORAL
  Filled 2019-11-28: qty 1

## 2019-11-28 MED ORDER — FENTANYL CITRATE (PF) 100 MCG/2ML IJ SOLN
INTRAMUSCULAR | Status: DC | PRN
  Administered 2019-11-28: 50 ug via INTRAVENOUS
  Administered 2019-11-28: 25 ug via INTRAVENOUS

## 2019-11-28 MED ORDER — FENTANYL CITRATE (PF) 100 MCG/2ML IJ SOLN
INTRAMUSCULAR | Status: AC
  Filled 2019-11-28: qty 2

## 2019-11-28 MED ORDER — SODIUM CHLORIDE 0.9 % IV SOLN: INTRAVENOUS | Status: DC | PRN

## 2019-11-28 MED ORDER — DEXAMETHASONE SODIUM PHOSPHATE 10 MG/ML IJ SOLN
INTRAMUSCULAR | Status: AC
  Filled 2019-11-28: qty 1

## 2019-11-28 MED ORDER — ONDANSETRON 4 MG PO TBDP: 4.0000 mg | ORAL_TABLET | Freq: Four times a day (QID) | ORAL | Status: DC | PRN

## 2019-11-28 MED ORDER — GLYCOPYRROLATE 0.2 MG/ML IJ SOLN
INTRAMUSCULAR | Status: AC
  Filled 2019-11-28: qty 1

## 2019-11-28 MED ORDER — PHENYLEPHRINE HCL (PRESSORS) 10 MG/ML IV SOLN
INTRAVENOUS | Status: AC
  Filled 2019-11-28: qty 1

## 2019-11-28 MED ORDER — EPHEDRINE 5 MG/ML INJ
INTRAVENOUS | Status: AC
  Filled 2019-11-28: qty 10

## 2019-11-28 MED ORDER — CHLORHEXIDINE GLUCONATE 0.12 % MT SOLN
OROMUCOSAL | Status: AC
  Administered 2019-11-28: 15 mL via OROMUCOSAL
  Filled 2019-11-28: qty 15

## 2019-11-28 MED ORDER — MELOXICAM 7.5 MG PO TABS
15.0000 mg | ORAL_TABLET | Freq: Every day | ORAL | Status: DC
  Administered 2019-11-28 – 2019-11-29 (×2): 15 mg via ORAL
  Filled 2019-11-28 (×2): qty 2

## 2019-11-28 MED ORDER — ACETAMINOPHEN 10 MG/ML IV SOLN
INTRAVENOUS | Status: DC | PRN
  Administered 2019-11-28: 1000 mg via INTRAVENOUS

## 2019-11-28 MED ORDER — FENTANYL CITRATE (PF) 100 MCG/2ML IJ SOLN
INTRAMUSCULAR | Status: AC
  Administered 2019-11-28: 25 ug via INTRAVENOUS
  Filled 2019-11-28: qty 2

## 2019-11-28 MED ORDER — ACETAMINOPHEN 10 MG/ML IV SOLN
INTRAVENOUS | Status: AC
  Filled 2019-11-28: qty 100

## 2019-11-28 MED ORDER — SODIUM CHLORIDE 0.9 % IV SOLN
INTRAVENOUS | Status: DC | PRN
  Administered 2019-11-28: 25 ug/min via INTRAVENOUS
  Administered 2019-11-28: 30 ug/min via INTRAVENOUS

## 2019-11-28 MED ORDER — FOLIC ACID 1 MG PO TABS
1.0000 mg | ORAL_TABLET | Freq: Every day | ORAL | Status: DC
  Administered 2019-11-29: 1 mg via ORAL
  Filled 2019-11-28: qty 1

## 2019-11-28 MED ORDER — FAMOTIDINE 20 MG PO TABS
ORAL_TABLET | ORAL | Status: AC
  Administered 2019-11-28: 20 mg
  Filled 2019-11-28: qty 1

## 2019-11-28 MED ORDER — PHENYLEPHRINE HCL (PRESSORS) 10 MG/ML IV SOLN
INTRAVENOUS | Status: DC | PRN
  Administered 2019-11-28: 80 ug via INTRAVENOUS

## 2019-11-28 MED ORDER — DEXTROSE IN LACTATED RINGERS 5 % IV SOLN: INTRAVENOUS | Status: DC

## 2019-11-28 MED ORDER — ONDANSETRON HCL 4 MG/2ML IJ SOLN: 4.0000 mg | Freq: Once | INTRAMUSCULAR | Status: DC | PRN

## 2019-11-28 MED ORDER — LIDOCAINE HCL (PF) 2 % IJ SOLN
INTRAMUSCULAR | Status: AC
  Filled 2019-11-28: qty 5

## 2019-11-28 MED ORDER — ACETAMINOPHEN 500 MG PO TABS
1000.0000 mg | ORAL_TABLET | Freq: Four times a day (QID) | ORAL | Status: DC
  Administered 2019-11-28 – 2019-11-29 (×3): 1000 mg via ORAL
  Filled 2019-11-28 (×3): qty 2

## 2019-11-28 MED ORDER — VITAMIN B-12 1000 MCG PO TABS
5000.0000 ug | ORAL_TABLET | Freq: Every day | ORAL | Status: DC
  Administered 2019-11-29: 5000 ug via ORAL
  Filled 2019-11-28: qty 5

## 2019-11-28 MED ORDER — HEMOSTATIC AGENTS (NO CHARGE) OPTIME
TOPICAL | Status: DC | PRN
  Administered 2019-11-28: 1 via TOPICAL

## 2019-11-28 MED ORDER — DEXAMETHASONE SODIUM PHOSPHATE 10 MG/ML IJ SOLN
INTRAMUSCULAR | Status: DC | PRN
  Administered 2019-11-28: 10 mg via INTRAVENOUS

## 2019-11-28 SURGICAL SUPPLY — 46 items
BACTOSHIELD CHG 4% 4OZ (MISCELLANEOUS) ×1
BASIN GRAD PLASTIC 32OZ STRL (MISCELLANEOUS) ×2 IMPLANT
BLADE SURG 15 STRL LF DISP TIS (BLADE) ×1 IMPLANT
BLADE SURG 15 STRL SS (BLADE) ×1
CANISTER SUCT 1200ML W/VALVE (MISCELLANEOUS) ×2 IMPLANT
CLIP VESOCCLUDE SM WIDE 6/CT (CLIP) ×2 IMPLANT
COVER WAND RF STERILE (DRAPES) ×2 IMPLANT
DERMABOND ADVANCED (GAUZE/BANDAGES/DRESSINGS) ×1
DERMABOND ADVANCED .7 DNX12 (GAUZE/BANDAGES/DRESSINGS) ×1 IMPLANT
DRAPE MAG INST 16X20 L/F (DRAPES) ×2 IMPLANT
DRAPE THYROID T SHEET (DRAPES) ×2 IMPLANT
DRSG TEGADERM 2-3/8X2-3/4 SM (GAUZE/BANDAGES/DRESSINGS) ×2 IMPLANT
ELECT CAUTERY BLADE TIP 2.5 (TIP) ×2
ELECT LARYNGEAL DUAL CHAN (ELECTRODE) ×2 IMPLANT
ELECT NEEDLE 20X.3 GREEN (MISCELLANEOUS) ×2
ELECT REM PT RETURN 9FT ADLT (ELECTROSURGICAL) ×2
ELECTRODE CAUTERY BLDE TIP 2.5 (TIP) ×1 IMPLANT
ELECTRODE NEEDLE 20X.3 GREEN (MISCELLANEOUS) ×1 IMPLANT
ELECTRODE REM PT RTRN 9FT ADLT (ELECTROSURGICAL) ×1 IMPLANT
GAUZE 4X4 16PLY RFD (DISPOSABLE) IMPLANT
GLOVE BIO SURGEON STRL SZ 6.5 (GLOVE) ×4 IMPLANT
GLOVE INDICATOR 7.0 STRL GRN (GLOVE) ×2 IMPLANT
GOWN STRL REUS W/ TWL LRG LVL3 (GOWN DISPOSABLE) ×2 IMPLANT
GOWN STRL REUS W/TWL LRG LVL3 (GOWN DISPOSABLE) ×2
HEMOSTAT SNOW SURGICEL 2X4 (HEMOSTASIS) ×2 IMPLANT
KIT TURNOVER KIT A (KITS) ×2 IMPLANT
LABEL OR SOLS (LABEL) ×2 IMPLANT
MANIFOLD NEPTUNE II (INSTRUMENTS) ×2 IMPLANT
NERVE STIMULATOR WAVEFORM 16S (NEUROSURGERY SUPPLIES)
NS IRRIG 500ML POUR BTL (IV SOLUTION) ×2 IMPLANT
PACK BASIN MINOR (MISCELLANEOUS) ×2 IMPLANT
PROBE NEUROSIGN BIPOL (MISCELLANEOUS) ×1 IMPLANT
PROBE NEUROSIGN BIPOLAR (MISCELLANEOUS) ×1
SCRUB CHG 4% DYNA-HEX 4OZ (MISCELLANEOUS) ×1 IMPLANT
SET WALTER ACTIVATION W/DRAPE (SET/KITS/TRAYS/PACK) ×2 IMPLANT
SHEARS HARMONIC 9CM CVD (BLADE) IMPLANT
SPONGE KITTNER 5P (MISCELLANEOUS) ×2 IMPLANT
STIMULATOR NERVE WAVEFORM 16S (NEUROSURGERY SUPPLIES) IMPLANT
STRIP CLOSURE SKIN 1/2X4 (GAUZE/BANDAGES/DRESSINGS) ×2 IMPLANT
SUT MNCRL AB 4-0 PS2 18 (SUTURE) IMPLANT
SUT PROLENE 4 0 PS 2 18 (SUTURE) ×2 IMPLANT
SUT SILK 2 0 (SUTURE) ×1
SUT SILK 2-0 18XBRD TIE 12 (SUTURE) ×1 IMPLANT
SUT VIC AB 4-0 RB1 27 (SUTURE) ×1
SUT VIC AB 4-0 RB1 27X BRD (SUTURE) ×1 IMPLANT
SYR BULB IRRIG 60ML STRL (SYRINGE) ×2 IMPLANT

## 2019-11-28 NOTE — Op Note (Signed)
Operative Note  Preoperative Diagnosis:  a thyroid nodule  Postoperative Diagnosis:  a thyroid nodule  Operation:  Completion Thyroidectomy  Surgeon: Fredirick Maudlin, MD  Assistant: Ronny Bacon, MD (a second surgeon was necessary due to the technical challenge of the case)  Anesthesia: General endotracheal with nerve monitoring system  Findings: There was fairly dense scar along the midline of the trachea.  We also encountered several old silk sutures from her prior procedure.  The subplatysmal plane was also fairly scarred in.  The recurrent laryngeal nerve was clearly identified and give a functional signal with the nerve monitoring system.  Both parathyroid glands were preserved in situ.  Indications: This is an 86 year old woman who underwent a left thyroid lobectomy in the past.  She had a nodule in the right that has been followed over time.  It was biopsied and was indeterminate on cytology with Afirma suspicious, however at the time, she did not wish to undergo surgery.  In less than 6 months, however, it grew by greater than 50%, and as such she was referred for completion thyroidectomy.  The risks of procedure were discussed with her in detail and she agreed to proceed.  Procedure In Detail: The patient was identified in the preoperative holding area and brought to the operating room where she was placed supine on the OR table.  Bony prominences were padded and bilateral sequential compression devices were placed on the lower extremities.  General endotracheal anesthesia was induced using the nerve monitoring system.  Tube placement was verified with the McGrath laryngoscope.  The grounding lead was placed.  Patient was positioned appropriately for the operation and sterilely prepped and draped in standard fashion.  A timeout was performed confirming her identity, the procedure being performed, her allergies, all necessary equipment was available, and that maintenance anesthesia was  adequate.  The patient's prior surgical incision was placed directly over her clavicles, therefore we made a new 5 cm transverse incision midway between the sternal notch and thyroid cartilage.  This was carried down through the subcutaneous tissues and platysma using electrocautery.  We encountered some scar tissue and several old silk knots, concordant with her history of prior surgical intervention.  The subplatysmal flaps were elevated, again finding scar tissue in place.  The strap muscles were opened in the midline with additional scarring appreciated in this location.  Once we had dissected just lateral to the midline, underneath the strap muscles, no additional scarring was encountered.  The strap muscles were elevated off of the lobe and retracted laterally.  The superior pole vessels were isolated and divided with silk ties and the harmonic scalpel.  The superior parathyroid gland was identified and preserved on a good pedicle.  We continued to dissect muscle fibers and divide small vessels until the thyroid could be rotated medially.  We then dissected into the fibrofatty tissues of the central neck and identified the recurrent laryngeal nerve.  It was stimulated and gave an appropriate signal.  The trachea was exposed medial to the nerve and the inferior pole vessels were divided.  The inferior parathyroid gland was dissected off of the capsule of the thyroid and preserved on a pedicle.  The inferior pole vessels were divided.  The remaining attachments of the thyroid to the trachea were divided and the lobe was completely excised.  It was handed off as a specimen.  The wound was irrigated and hemostasis achieved.  A Valsalva maneuver from the anesthesia team confirmed no ongoing surgical bleeding.  We applied SNoW for additional hemostasis.  The strap muscles were closed in the midline with running 4-0 Vicryl and platysma was closed with interrupted Vicryl.  The skin was closed with running  subcuticular Prolene.  The skin was cleaned.  Dermabond and Steri-Strips were applied.  The Prolene suture was removed.  The patient was awakened, extubated, and taken to the postanesthesia care unit in good condition.  EBL: Less than 5 cc  IVF: See anesthesia record  Specimen(s): Right thyroid lobe to pathology  Complications: none immediately apparent.   Counts: all needles, instruments, and sponges were counted and reported to be correct in number at the end of the case.   I was present for and participated in the entire operation.  Fredirick Maudlin 12:57 PM

## 2019-11-28 NOTE — Anesthesia Postprocedure Evaluation (Signed)
Anesthesia Post Note  Patient: [name]  Procedure(s) Performed: COMPLETION OF THYROIDECTOMY (Right )  Patient location during evaluation: PACU Anesthesia Type: General Level of consciousness: awake and alert and oriented Pain management: pain level controlled Vital Signs Assessment: post-procedure vital signs reviewed and stable Respiratory status: spontaneous breathing, nonlabored ventilation and respiratory function stable Cardiovascular status: blood pressure returned to baseline and stable Postop Assessment: no signs of nausea or vomiting Anesthetic complications: no   No complications documented.   Last Vitals:  Vitals:   11/28/19 1423 11/28/19 1429  BP: 123/74   Pulse: 99 99  Resp: 17 16  Temp: 36.8 C   SpO2: 93% 96%    Last Pain:  Vitals:   11/28/19 1423  TempSrc:   PainSc: 2                  Kristell Wooding

## 2019-11-28 NOTE — Progress Notes (Signed)
PHARMACIST - PHYSICIAN ORDER COMMUNICATION  CONCERNING: P&T Medication Policy on Herbal Medications  DESCRIPTION:  This patient's order for:  turmeric  has been noted.  This product(s) is classified as an "herbal" or natural product. Due to a lack of definitive safety studies or FDA approval, nonstandard manufacturing practices, plus the potential risk of unknown drug-drug interactions while on inpatient medications, the Pharmacy and Therapeutics Committee does not permit the use of "herbal" or natural products of this type within [place].   ACTION TAKEN: The pharmacy department is unable to verify this order at this time and your patient has been informed of this safety policy. Please reevaluate patient's clinical condition at discharge and address if the herbal or natural product(s) should be resumed at that time.  

## 2019-11-28 NOTE — Transfer of Care (Signed)
Immediate Anesthesia Transfer of Care Note  Patient: [name]  Procedure(s) Performed: COMPLETION OF THYROIDECTOMY (Right )  Patient Location: PACU  Anesthesia Type:General  Level of Consciousness: awake, alert  and patient cooperative  Airway & Oxygen Therapy: Patient Spontanous Breathing and Patient connected to face mask oxygen  Post-op Assessment: Report given to RN and Post -op Vital signs reviewed and stable  Post vital signs: Reviewed and stable  Last Vitals:  Vitals Value Taken Time  BP 128/84 11/28/19 1253  Temp    Pulse 119 11/28/19 1257  Resp 13 11/28/19 1257  SpO2 99 % 11/28/19 1257  Vitals shown include unvalidated device data.  Last Pain:  Vitals:   11/28/19 0905  TempSrc: Oral  PainSc: 4          Complications: No complications documented.

## 2019-11-28 NOTE — Interval H&P Note (Signed)
History and Physical Interval Note:  11/28/2019 10:36 AM  [name]  has presented today for surgery, with the diagnosis of Suspicious right thyroid nodule.  The various methods of treatment have been discussed with the patient and family. After consideration of risks, benefits and other options for treatment, the patient has consented to  Procedure(s): COMPLETION OF THYROIDECTOMY (Right) as a surgical intervention.  The patient's history has been reviewed, patient examined, no change in status, stable for surgery.  I have reviewed the patient's chart and labs.  Questions were answered to the patient's satisfaction.     Fredirick Maudlin

## 2019-11-28 NOTE — Anesthesia Preprocedure Evaluation (Signed)
Anesthesia Evaluation  Patient identified by MRN, date of birth, ID band Patient awake    Reviewed: Allergy & Precautions, NPO status , Patient's Chart, lab work & pertinent test results  History of Anesthesia Complications Negative for: history of anesthetic complications  Airway Mallampati: III  TM Distance: >3 FB Neck ROM: Full    Dental  (+) Poor Dentition   Pulmonary neg sleep apnea, neg COPD, former smoker,    breath sounds clear to auscultation- rhonchi (-) wheezing      Cardiovascular hypertension, Pt. on medications (-) CAD, (-) Past MI, (-) Cardiac Stents and (-) CABG  Rhythm:Regular Rate:Normal - Systolic murmurs and - Diastolic murmurs    Neuro/Psych neg Seizures negative neurological ROS  negative psych ROS   GI/Hepatic negative GI ROS, Neg liver ROS,   Endo/Other  neg diabetesThyroid nodule   Renal/GU negative Renal ROS     Musculoskeletal  (+) Arthritis ,   Abdominal (+) - obese,   Peds  Hematology negative hematology ROS (+)   Anesthesia Other Findings Past Medical History: No date: Arthritis No date: Asthma     Comment:  as a child No date: Thyroid nodule   Reproductive/Obstetrics                             Anesthesia Physical Anesthesia Plan  ASA: II  Anesthesia Plan: General   Post-op Pain Management:    Induction: Intravenous  PONV Risk Score and Plan: 2 and Ondansetron and Dexamethasone  Airway Management Planned: Oral ETT  Additional Equipment:   Intra-op Plan:   Post-operative Plan: Extubation in OR  Informed Consent: I have reviewed the patients History and Physical, chart, labs and discussed the procedure including the risks, benefits and alternatives for the proposed anesthesia with the patient or authorized representative who has indicated his/her understanding and acceptance.     Dental advisory given  Plan Discussed with: CRNA and  Anesthesiologist  Anesthesia Plan Comments:         Anesthesia Quick Evaluation

## 2019-11-28 NOTE — Plan of Care (Signed)
Continuing with plan of care. 

## 2019-11-28 NOTE — Anesthesia Procedure Notes (Signed)
Procedure Name: Intubation Performed by: Fletcher-Harrison, Baldwin Racicot, CRNA Pre-anesthesia Checklist: Patient identified, Emergency Drugs available, Suction available and Patient being monitored Patient Re-evaluated:Patient Re-evaluated prior to induction Oxygen Delivery Method: Circle system utilized Preoxygenation: Pre-oxygenation with 100% oxygen Induction Type: IV induction Ventilation: Mask ventilation without difficulty Laryngoscope Size: McGraph and 3 Grade View: Grade I Tube type: Oral Tube size: 6.5 mm Number of attempts: 1 Airway Equipment and Method: Stylet and Oral airway Placement Confirmation: ETT inserted through vocal cords under direct vision,  positive ETCO2,  breath sounds checked- equal and bilateral and CO2 detector Secured at: 21 cm Tube secured with: Tape Dental Injury: Teeth and Oropharynx as per pre-operative assessment        

## 2019-11-29 DIAGNOSIS — E041 Nontoxic single thyroid nodule: Secondary | ICD-10-CM | POA: Diagnosis not present

## 2019-11-29 LAB — ALBUMIN: Albumin: 3 g/dL — ABNORMAL LOW (ref 3.5–5.0)

## 2019-11-29 LAB — CALCIUM: Calcium: 7.8 mg/dL — ABNORMAL LOW (ref 8.9–10.3)

## 2019-11-29 MED ORDER — CALCIUM CARBONATE-VITAMIN D 600-200 MG-UNIT PO CAPS: 1200.0000 mg | ORAL_CAPSULE | Freq: Every day | ORAL | 0 refills | Status: AC

## 2019-11-29 MED ORDER — LEVOTHYROXINE SODIUM 75 MCG PO TABS: 75.0000 ug | ORAL_TABLET | Freq: Every day | ORAL | 6 refills | Status: DC

## 2019-11-29 MED ORDER — CALCIUM CARBONATE 1250 (500 CA) MG PO TABS: 1.0000 | ORAL_TABLET | Freq: Three times a day (TID) | ORAL | 0 refills | Status: DC

## 2019-11-29 MED ORDER — TRAMADOL HCL 50 MG PO TABS: 50.0000 mg | ORAL_TABLET | Freq: Four times a day (QID) | ORAL | 0 refills | Status: DC | PRN

## 2019-11-29 MED ORDER — ACETAMINOPHEN 500 MG PO TABS: 1000.0000 mg | ORAL_TABLET | Freq: Four times a day (QID) | ORAL | 0 refills | Status: AC

## 2019-11-29 NOTE — Discharge Instructions (Signed)
Post-operative Home Care After Thyroid or Parathyroid Surgery  What should I expect after my operation?   Marland Kitchen You will see swelling under the incision and/or bruising under it in a few days. This is usually greatest on the second or third day after the operation. You may also feel the sensation of swelling and/or of firmness that can last for a month or more.    . Neck incisions heal rapidly, within a week or two. You can get them damp after about 24 hours, however do not submerge the incision or allow it to become soaked or saturated with water or sweat for 2 weeks.   . Your scar will be most visible 1-2 months after the operation and gradually fade over the next 6-8 months. As it heals, a scar looks more pink or red than the skin around it.   Dennis Bast may feel a firm 'healing ridge' directly under the incision. This is normal and will soften and go away when healing is complete within 3-6 months.   . All incisions are sensitive to sunlight.  For one year after surgery, you should use sunscreen when outdoors for long periods to prevent darkening of the scar area.    . We recommend that you not expose the incision to the ultraviolet lights used in tanning booths.    Will my neck hurt?  . Most patients experience very little pain, but you may feel some neck stiffness/soreness in your shoulder, back or neck and tension headache that takes a few days or weeks to go away completely.    . Some patients also notice minor changes in swallowing, which improve over time.   . The skin just above and below your incision will feel numb. This will improve over several months, but some persons may have a longterm decrease in sensation.   .  You may apply cold pack over your incision to improve the pain.    How will I manage my pain at home?  . Take NSAIDS like ibuprofen (Motrin, Advil), naproxen (Naprosyn, Aleve) or acetaminophen (Tylenol) every 6 hours for the first 3-5 days after operation. This will  minimize the pain you feel.      ? To prevent Tylenol overdose, do not take a Tylenol doses at the same time as a combination narcotic dose that contains Tylenol, like Norco and Percoocet. However, You may take them 4-6 hours apart.     . If you have sore/stiff muscles in your back, shoulder or neck, you may use moist warm heat or heating pad on the affected areas 15-20 minutes at a time several times a day.   . Gently massaging your neck muscles will improve the neck stiffness.    . Do not be afraid to move your neck. Gently flexing and stretching your neck muscles will prevent stiffness.    . Stronger pain medication or narcotic (like Norco or Percocet) for severe pain is rarely needed. If it is, however, DO NOT drive a car or drink alcohol while taking these medications.    . Narcotics also cause constipation. Stool softeners (Colace) and fiber (fruits, bran, vegetables) and extra fluid intake helps. A stimulant laxative (Milk of Magnesia, Senokot) may be needed as well.    Will my voice be affected?  Your voice may be hoarse or weak at first, because the surgery took place near the voice box, but usually recovers within weeks. Some patients also notice a change in the pitch of their voices that  affects singing. Rarely, these changes can be permanent.   Are there any diet restrictions?  No, return to your previous diet and always eat a well balanced diet, low in fat, etc.    How will I care for my incision?  . If you have paper "steri strips" on your incision, leave them in place until they begin to fall off naturally. If they become discolored or messy, you may remove them 7-10 days after your operation.   . If you have a skin glue (Dermabond) closure, you may notice tiny pieces of yellow material on your washcloth. Any sutures (stitches) you may have are dissolvable and do not need to be removed.  . You may shower then gently pat dry your incision.   . Do not apply ointments or  powders.   . Avoid using Vitamin E cream or other moisturizers on the incision until after your first follow-up visit.   What new medications might I take home?   ? Calcium supplement:  Your body's calcium levels may fall after a total thyroidectomy or parathyroid operation. We recommend you purchase Os-Cal 500 (one tablet equals 500 mg of elemental calcium) or Citracal (2 tablets equal 630 mg of elemental calcium; the "Petites" version contains 500 mg elemental calcium per 2 tablets). You may be taking 3-6 (or more) tablets per day, depending on your doctor's recommendation. You will need to take calcium at different times to avoid medication interaction. Ask your pharmacists, nurse, or doctor about specific interactions.   ? Thyroid Hormone:  If you have had a thyroid operation, you may be prescribed thyroid hormone replacement, called levothyroxine (Synthroid, Levothroid, etc.). A blood test will be done in 6-8 weeks to ensure the dosage is correct  by your doctor or your surgeon.   When can I go back to normal activities?  Marland Kitchen You may return to work in 5-7 days or sooner if desired. Contact the clinic coordinator if you need employer forms completed.   . You may drive as long as you are not taking any narcotics and your neck stiffness is resolved    Can I resume my previous medications?   . Yes, unless directed not to by your doctor.   . Before discharge, be sure to review your previous medications with your doctor or inpatient medical team.    When do I call for advice?   - If your temperature > 101.5  - You have trouble talking or breathing  - Your fingers/ hands or face or around your lips becomes numb and tingling. (This may mean your calcium level is low.) If this occurs, please chew 2-3 Tums every 30 minutes until it resolves.  If it has not resolved after 2 hours, please call our office.  - You have trouble swallowing  - Your incision becomes swollen, red or drainage occurs.

## 2019-11-29 NOTE — Discharge Summary (Signed)
Physician Discharge Summary  Patient ID: [name] MRN: [id number] DOB/AGE: 04-08-1933 86 y.o.  Admit date: 11/28/2019 Discharge date: 11/29/2019  Admission Diagnoses: Suspicious thyroid nodule  Discharge Diagnoses:  Active Problems:   Thyroid nodule   S/P partial thyroidectomy Completion thyroidectomy, pathology pending  Discharged Condition: good  Hospital Course: The patient was taken to the operating room on November 28, 2019 where she underwent completion thyroidectomy.  Her recovery was normal and she had good pain control with oral pain medications.  She was tolerating a diet.  She was felt suitable for discharge.  Consults: None  Significant Diagnostic Studies: None  Treatments: surgery: Completion thyroidectomy  Discharge Exam: Blood pressure 109/63, pulse 82, temperature 98.2 F (36.8 C), temperature source Oral, resp. rate 16, height 5\' 2"  (1.575 m), weight 53.5 kg, SpO2 98 %. General appearance: alert and no distress Resp: Normal work of breathing on room air Cardio: regular rate and rhythm Incision/Wound: Minor periincisional bruising, Steri-Strips intact.  Disposition: Discharge disposition: 01-Home or Self Care       Discharge Instructions    Call MD for:  difficulty breathing, headache or visual disturbances   Complete by: As directed    Call MD for:  extreme fatigue   Complete by: As directed    Call MD for:  hives   Complete by: As directed    Call MD for:  persistant dizziness or light-headedness   Complete by: As directed    Call MD for:  persistant nausea and vomiting   Complete by: As directed    Call MD for:  redness, tenderness, or signs of infection (pain, swelling, redness, odor or green/yellow discharge around incision site)   Complete by: As directed    Call MD for:  severe uncontrolled pain   Complete by: As directed    Call MD for:  temperature >100.4   Complete by: As directed    Diet - low sodium heart healthy   Complete by:  As directed    Driving Restrictions   Complete by: As directed    No driving for 1 week or while taking narcotic pain medications.   Increase activity slowly   Complete by: As directed    Leave dressing on - Keep it clean, dry, and intact until clinic visit   Complete by: As directed    You may shower.  Do not submerge the incision or allow it to become saturated with water or sweat.  It is okay if it gets a little bit damp; just pat it dry gently with a clean soft towel.  Allow the Steri-Strips (paper tapes) to follow-up on their own.     Allergies as of 11/29/2019      Reactions   Indomethacin Other (See Comments), Palpitations      Medication List    TAKE these medications   acetaminophen 500 MG tablet Commonly known as: TYLENOL Take 2 tablets (1,000 mg total) by mouth every 6 (six) hours.   ascorbic acid 1000 MG tablet Commonly known as: VITAMIN C Take 1,000 mg by mouth daily.   calcium carbonate 1250 (500 Ca) MG tablet Commonly known as: OS-CAL - dosed in mg of elemental calcium Take 1 tablet (500 mg of elemental calcium total) by mouth 3 (three) times daily with meals for 14 days.   Calcium Carbonate-Vitamin D 600-200 MG-UNIT Caps Take 1,200 mg by mouth daily. What changed:   medication strength  how much to take   Cyanocobalamin 5000 MCG Tbdp Take 5,000  mcg by mouth daily.   folic acid 1 MG tablet Commonly known as: FOLVITE Take 1 mg by mouth daily.   hydrochlorothiazide 25 MG tablet Commonly known as: HYDRODIURIL Take 25 mg by mouth daily.   levothyroxine 75 MCG tablet Commonly known as: SYNTHROID Take 1 tablet (75 mcg total) by mouth daily at 6 (six) AM. Start taking on: November 30, 2019   meloxicam 15 MG tablet Commonly known as: MOBIC Take 15 mg by mouth daily.   methotrexate 2.5 MG tablet Commonly known as: RHEUMATREX Take 15 mg by mouth every Thursday.   Multi-Vitamin tablet Take 1 tablet by mouth daily. Centrum   traMADol 50 MG  tablet Commonly known as: ULTRAM Take 1 tablet (50 mg total) by mouth every 6 (six) hours as needed for moderate pain or severe pain.   TURMERIC PO Take 1,000 mg by mouth daily.            Discharge Care Instructions  (From admission, onward)         Start     Ordered   11/29/19 0000  Leave dressing on - Keep it clean, dry, and intact until clinic visit       Comments: You may shower.  Do not submerge the incision or allow it to become saturated with water or sweat.  It is okay if it gets a little bit damp; just pat it dry gently with a clean soft towel.  Allow the Steri-Strips (paper tapes) to follow-up on their own.   11/29/19 0017          Follow-up Information    Fredirick Maudlin, MD. Schedule an appointment as soon as possible for a visit in 2 week(s).   Specialty: General Surgery Contact information: [address] [place] [address] [phone]               Signed: Fredirick Maudlin 11/29/2019, 8:22 AM

## 2019-11-29 NOTE — Plan of Care (Signed)
Discharge teaching completed with patient who is in stable condition. 

## 2019-12-05 LAB — SURGICAL PATHOLOGY

## 2019-12-09 ENCOUNTER — Encounter: Payer: Self-pay | Admitting: General Surgery

## 2019-12-09 ENCOUNTER — Other Ambulatory Visit: Payer: Self-pay

## 2019-12-09 ENCOUNTER — Ambulatory Visit (INDEPENDENT_AMBULATORY_CARE_PROVIDER_SITE_OTHER): Payer: Medicare Other | Admitting: General Surgery

## 2019-12-09 VITALS — BP 152/79 | HR 99 | Temp 97.8°F | Ht 61.0 in | Wt 119.0 lb

## 2019-12-09 DIAGNOSIS — C73 Malignant neoplasm of thyroid gland: Secondary | ICD-10-CM

## 2019-12-09 NOTE — Patient Instructions (Addendum)
Dr Celine Ahr discussed next recommended steps and answered all patient questions at today's visit. Patient informed to STOP taking the extra calcium Dr Celine Ahr prescribed. Patient advised to call and schedule follow up with Dr Gabriel Carina to check Thyroid Levels in 6-8 weeks.  Patient may try Vitamin E oil or Cocoa Butter to massage to help with swelling and healing of scar. Follow-up with our office as needed. Please call and ask to speak with a nurse if you develop questions or concerns.  GENERAL POST-OPERATIVE PATIENT INSTRUCTIONS   WOUND CARE INSTRUCTIONS:  Keep a dry clean dressing on the wound if there is drainage. The initial bandage may be removed after 24 hours.  Once the wound has quit draining you may leave it open to air.  If clothing rubs against the wound or causes irritation and the wound is not draining you may cover it with a dry dressing during the daytime.  Try to keep the wound dry and avoid ointments on the wound unless directed to do so.  If the wound becomes bright red and painful or starts to drain infected material that is not clear, please contact your physician immediately.  If the wound is mildly pink and has a thick firm ridge underneath it, this is normal, and is referred to as a healing ridge.  This will resolve over the next 4-6 weeks.  BATHING: You may shower if you have been informed of this by your surgeon. However, Please do not submerge in a tub, hot tub, or pool until incisions are completely sealed or have been told by your surgeon that you may do so.  DIET:  You may eat any foods that you can tolerate.  It is a good idea to eat a high fiber diet and take in plenty of fluids to prevent constipation.  If you do become constipated you may want to take a mild laxative or take ducolax tablets on a daily basis until your bowel habits are regular.  Constipation can be very uncomfortable, along with straining, after recent surgery.  ACTIVITY:  You are encouraged to cough and  deep breath or use your incentive spirometer if you were given one, every 15-30 minutes when awake.  This will help prevent respiratory complications and low grade fevers post-operatively if you had a general anesthetic.  You may want to hug a pillow when coughing and sneezing to add additional support to the surgical area, if you had abdominal or chest surgery, which will decrease pain during these times.  You are encouraged to walk and engage in light activity for the next two weeks.  You should not lift more than 20 pounds, until 4 to 6 weeks after surgery as it could put you at increased risk for complications.  Twenty pounds is roughly equivalent to a plastic bag of groceries. At that time- Listen to your body when lifting, if you have pain when lifting, stop and then try again in a few days. Soreness after doing exercises or activities of daily living is normal as you get back in to your normal routine.  MEDICATIONS:  Try to take narcotic medications and anti-inflammatory medications, such as tylenol, ibuprofen, naprosyn, etc., with food.  This will minimize stomach upset from the medication.  Should you develop nausea and vomiting from the pain medication, or develop a rash, please discontinue the medication and contact your physician.  You should not drive, make important decisions, or operate machinery when taking narcotic pain medication.  SUNBLOCK Use sun block  to incision area over the next year if this area will be exposed to sun. This helps decrease scarring and will allow you avoid a permanent darkened area over your incision.  QUESTIONS:  Please feel free to call our office if you have any questions, and we will be glad to assist you. [phone].

## 2019-12-09 NOTE — Progress Notes (Signed)
[name] is here today for a postoperative visit.  She is an 86 year old woman with a prior history of left thyroid lobectomy, now status post completion thyroidectomy on November 28, 2019.  Her final pathology demonstrated:  SURGICAL PATHOLOGY  CASE: [id number]  PATIENT: [name]  Surgical Pathology Report      Specimen Submitted:  A. Right thyroid lobe   Clinical History: Suspicious right thyroid nodule     DIAGNOSIS:  A. 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  - NO PARATHYROID TISSUE OR LYMPH NODES IDENTIFIED.  - SEE CANCER SUMMARY.   Comment:  Numerous additional deeper recut levels were examined to better evaluate  tissue margins. Although the gross impression indicates that the tumor  nodules are present 0.1 cm from the overlying inked capsule, sections  from block A1 show both tumor foci, without evidence of an intact  surrounding capsule for either nodule. Focal ink is present on tumor,  but likely represents carryover during the grossing process. Tumor does  not show evidence of cautery artifact. It is favored that the appearance  of the surgical margins in this section is artifactual in nature, and  the final surgical margins are negative for carcinoma. There is no  evidence of tumor involving strap muscles to suggest extrathyroidal  extension.   CANCER CASE SUMMARY: THYROID GLAND  Standard(s): AJCC-UICC 8   SPECIMEN  Procedure: Right lobectomy   TUMOR  Tumor Focality: Multifocal  Tumor Site: Right lobe  Tumor Size: 1.8 cm in greatest dimension  Histologic Type: Papillary carcinoma, classic (usual, conventional)  Angioinvasion (Vascular Invasion): Not identified  Lymphatic Invasion: Not identified  Extrathyroidal Extension: Not identified  Margin Status: Margins negative for carcinoma   REGIONAL LYMPH NODES  Regional Lymph Nodes: Not applicable (no  regional lymph nodes submitted  or found)   DISTANT METASTASIS  Distant Site(s) Involved, if applicable (select all that apply): Not  applicable   PATHOLOGIC STAGE CLASSIFICATION (pTNM, AJCC 8th Edition):  TNM Descriptors: m (multiple primary tumors)  mpT1b  pN not assigned  pM - Not applicable   She reports doing well since her operation.  Her voice and swallowing are normal.  She denies any paresthesias or other symptoms of hypocalcemia.  She is currently taking Os-Cal 500 3 times daily, in addition to her preoperative 1200 mg of calcium carbonate.  She is on 75 mcg of levothyroxine daily.  She reports taking it first thing in the morning on an empty stomach and waiting 30 minutes before taking any other additional medications or supplements.  She denies any symptoms or side effects from the medication at this time.  Today's Vitals   12/09/19 1107  BP: (!) 152/79  Pulse: 99  Temp: 97.8 F (36.6 C)  TempSrc: Oral  SpO2: (!) 89%  Weight: 119 lb (54 kg)  Height: 5\' 1"  (1.549 m)  PainSc: 0-No pain  PainLoc: Throat   Body mass index is 22.48 kg/m. Focused examination of the neck demonstrates that her Steri-Strips are still in place.  These were removed to reveal a well approximated transverse thyroidectomy incision.  There is a normal amount of expected postoperative swelling present.  There is no erythema, induration, or drainage present.  There is a healing ridge beneath the surface.  Impression and plan: This is an 86 year old woman who underwent a completion thyroidectomy for suspicious nodule.  Although her final pathology did reveal papillary thyroid carcinoma, margins appear to  be clear and there was no suspicious lymphadenopathy seen at the time of surgery, therefore formal nodal dissection was not performed.  I do not think she requires radioactive iodine ablation, and I discussed this with her in a fair amount of detail.  I explained to her that the goal of radioactive iodine  is for survival benefit and disease-free survival, neither of which I think are likely to be any different in her in the absence of remnant ablation.  I would like her to contact Dr. Joycie Peek office and have labs done in about 6 to 8 weeks to confirm that her current levothyroxine dose is appropriate or to have it adjusted as needed.  We discussed scar care today, including massage with a topical emollient agent to help minimize tethering and improve the overall cosmesis.  She should also apply sunscreen for at least the next year to minimize the risk of altered pigmentation.  She may discontinue the extra calcium that I prescribed and take only the 1200 mg daily that she had been taking prior to her surgery.  I will see her on an as-needed basis.

## 2020-03-01 IMAGING — US VENOUS DOPPLER ULTRASOUND OF LEFT LOWER EXTREMITY
1 series · 13 of 24 positions shown · non-contrast
Comparison: None.

CLINICAL DATA: Left lower extremity edema.



[Series 1: venous doppler ultrasound of left lower extremity · 0.09mm/px · 13 of 34 slices shown]
[im 1/34]
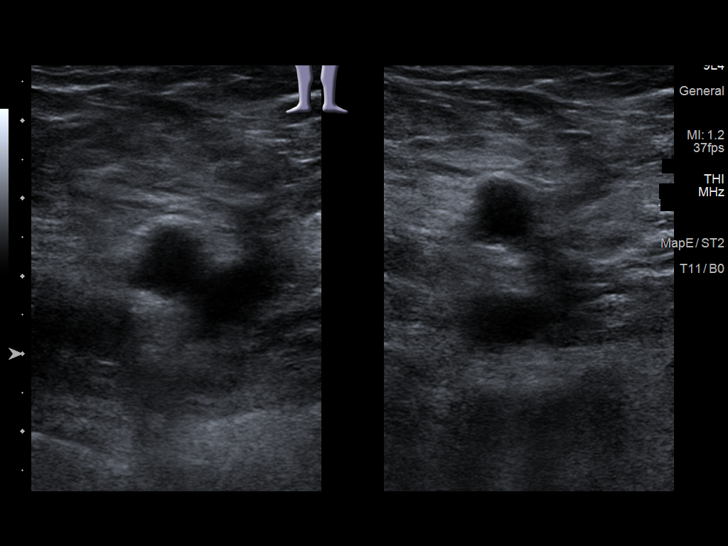
[im 3/34]
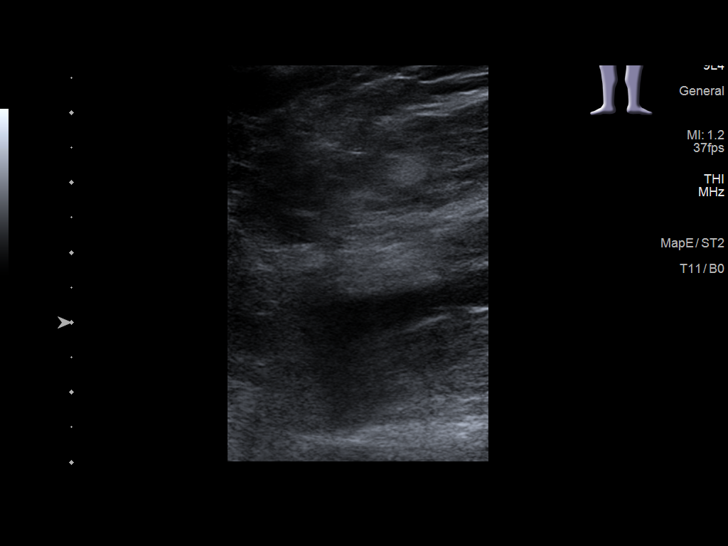
[im 6/34]
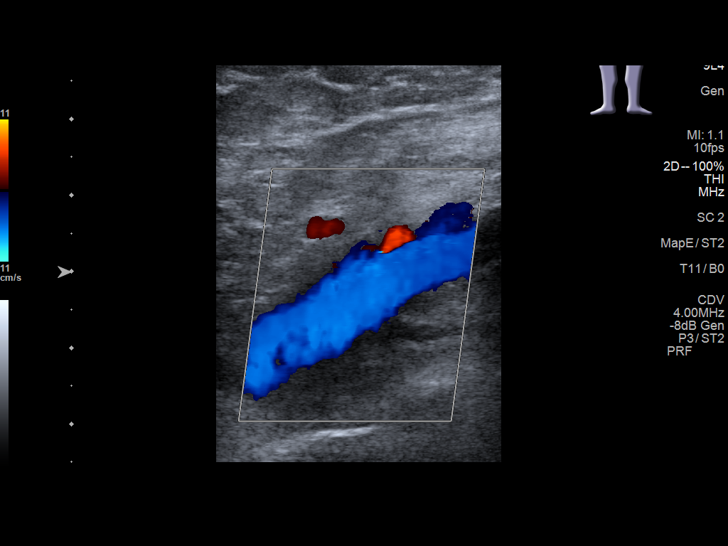
[im 9/34]
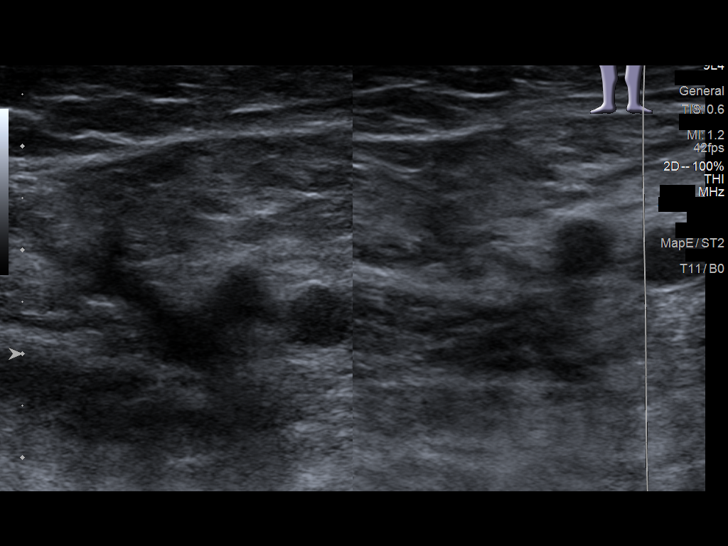
[im 12/34]
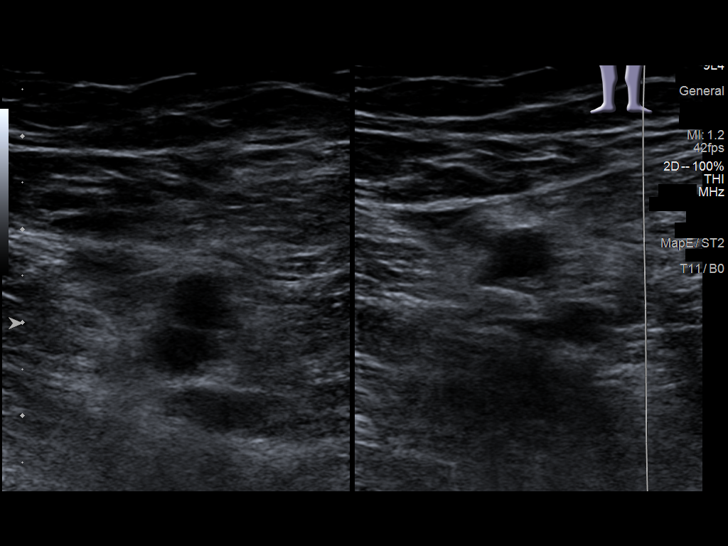
[im 15/34]
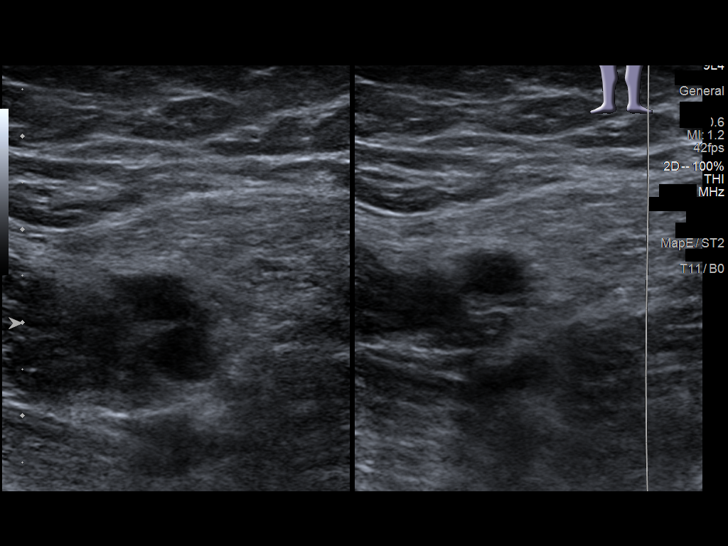
[im 18/34]
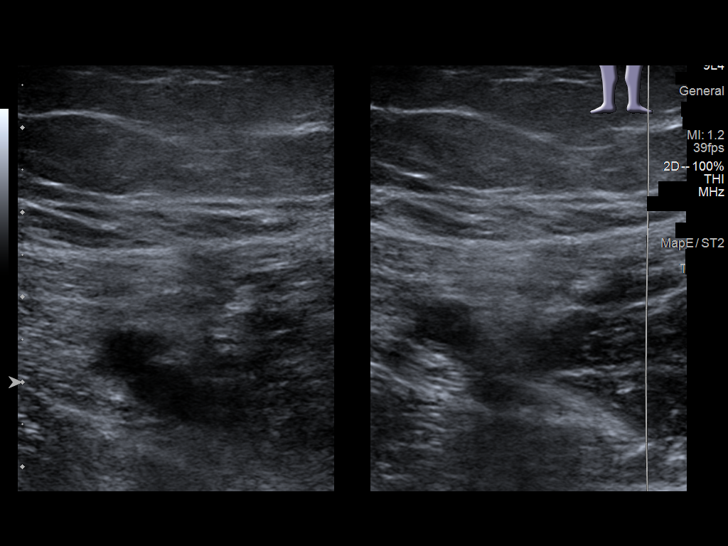
[im 19/34]
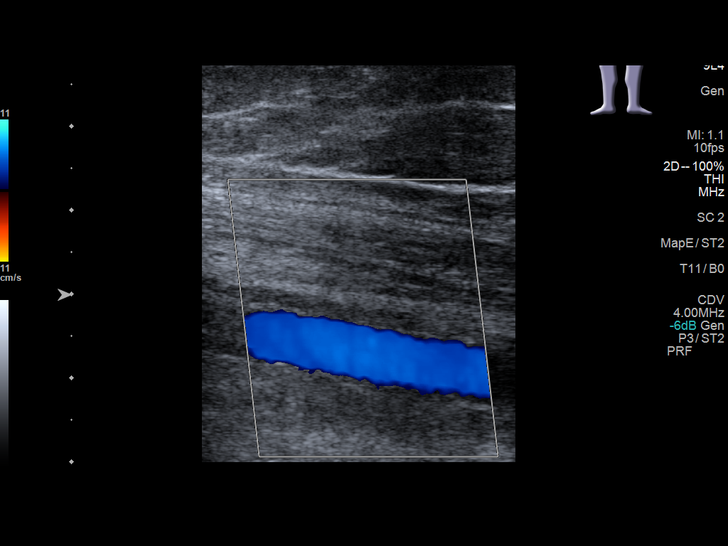
[im 22/34]
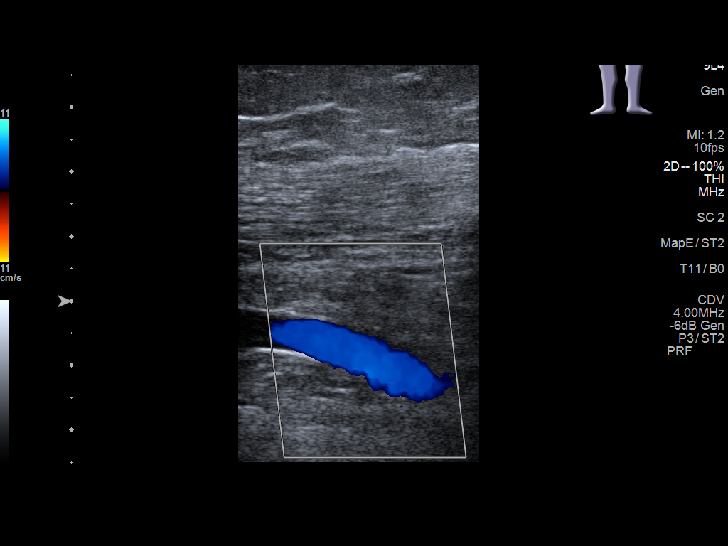
[im 25/34]
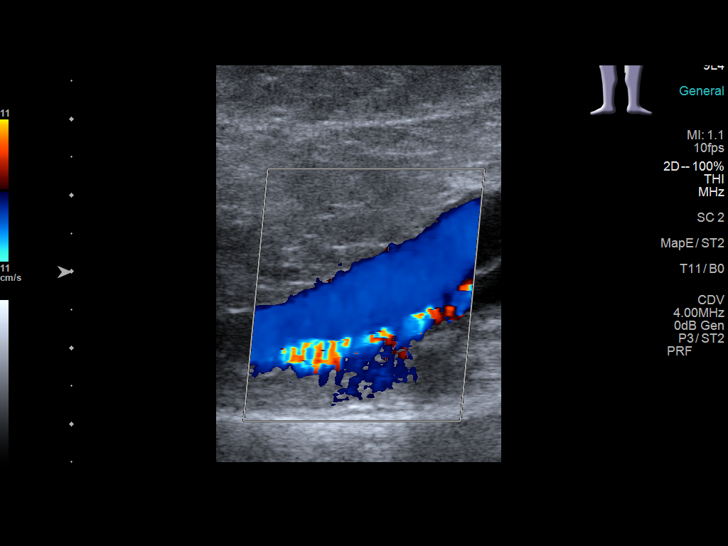
[im 28/34]
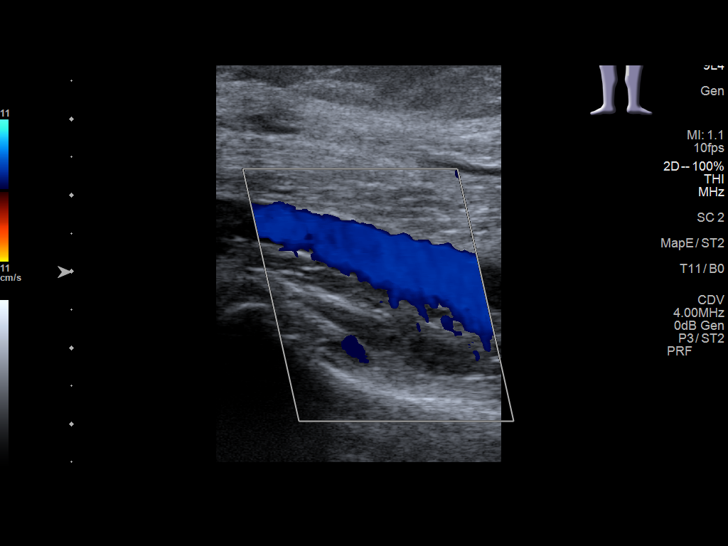
[im 31/34]
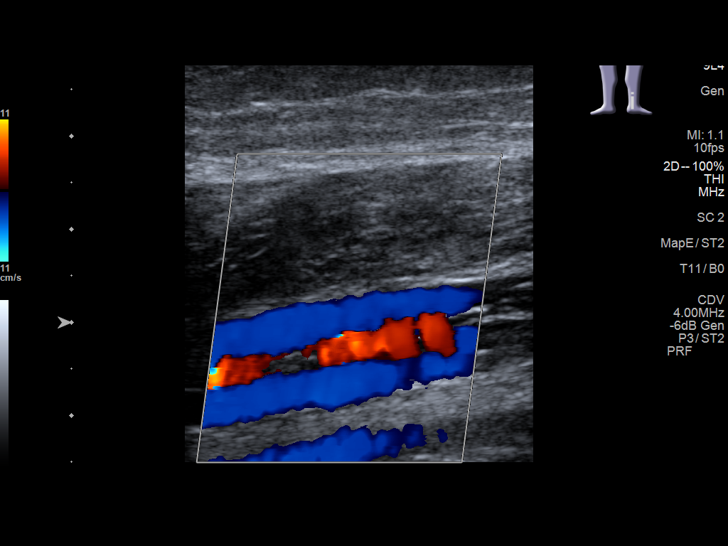
[im 34/34]
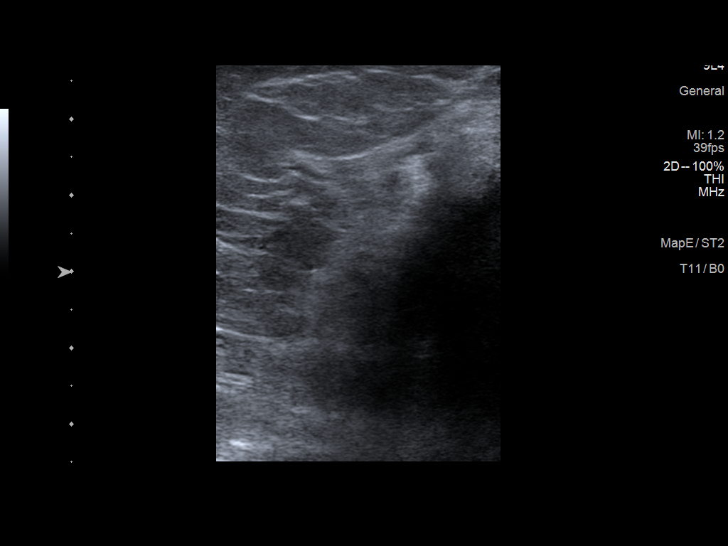

[13 of 24 positions shown; findings below may reference images not displayed]

FINDINGS: Contralateral Common Femoral Vein: Respiratory phasicity is normal
and symmetric with the symptomatic side. No evidence of thrombus.
Normal compressibility.

Common Femoral Vein: No evidence of thrombus. Normal
compressibility, respiratory phasicity and response to augmentation.

Saphenofemoral Junction: No evidence of thrombus. Normal
compressibility and flow on color Doppler imaging.

Profunda Femoral Vein: No evidence of thrombus. Normal
compressibility and flow on color Doppler imaging.

Femoral Vein: No evidence of thrombus. Normal compressibility,
respiratory phasicity and response to augmentation.

Popliteal Vein: No evidence of thrombus. Normal compressibility,
respiratory phasicity and response to augmentation.

Calf Veins: No evidence of thrombus. Normal compressibility and flow
on color Doppler imaging.

Superficial Great Saphenous Vein: No evidence of thrombus. Normal
compressibility.

Venous Reflux:  None.

Other Findings: No evidence of superficial thrombophlebitis or
abnormal fluid collection.
IMPRESSION: No evidence of left lower extremity deep venous thrombosis.

## 2020-07-28 ENCOUNTER — Telehealth: Payer: Medicare Other | Admitting: *Deleted

## 2020-07-28 NOTE — Telephone Encounter (Signed)
Patient called and is requesting a refill on synthroid, she stated that she called the pharmacy and they told her we have not responded back to them, but I let patient know that I don't see anything in the chart. Patient is a little worried about it. She took her last pill today. Please call and advise

## 2020-07-28 NOTE — Telephone Encounter (Signed)
Spoke with the patient and she says that she has an appointment to see Dr Gabriel Carina in endocrinology on Monday. I let her know that we refilled her prescription for 30 days but that Dr Gabriel Carina would need to write her for a new on when she sees her. She is aware of this.

## 2020-08-27 IMAGING — US US THYROID
1 series · 13 of 25 positions shown · non-contrast
Comparison: None available

CLINICAL DATA: Nodule.  Remote history of left hemithyroidectomy.

EXAM:
THYROID ULTRASOUND
TECHNIQUE: Ultrasound examination of the thyroid gland and adjacent soft
tissues was performed.

[Series 1: us thyroid · 0.07mm/px · 13 of 41 slices shown]
[im 1/41]
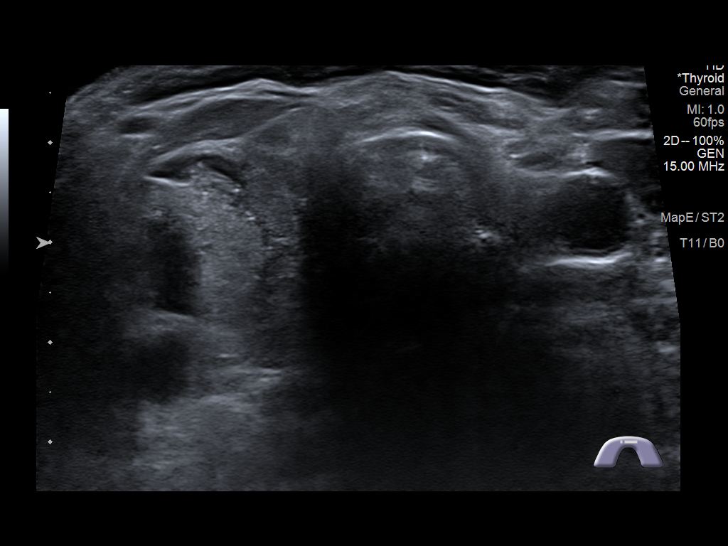
[im 4/41]
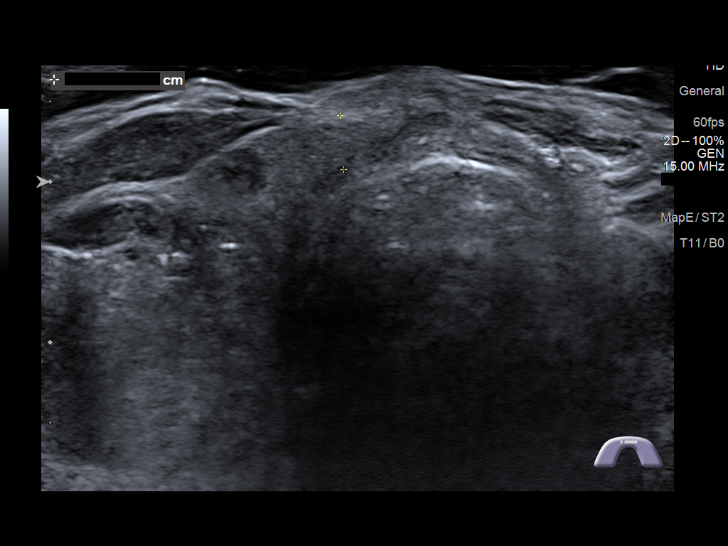
[im 7/41]
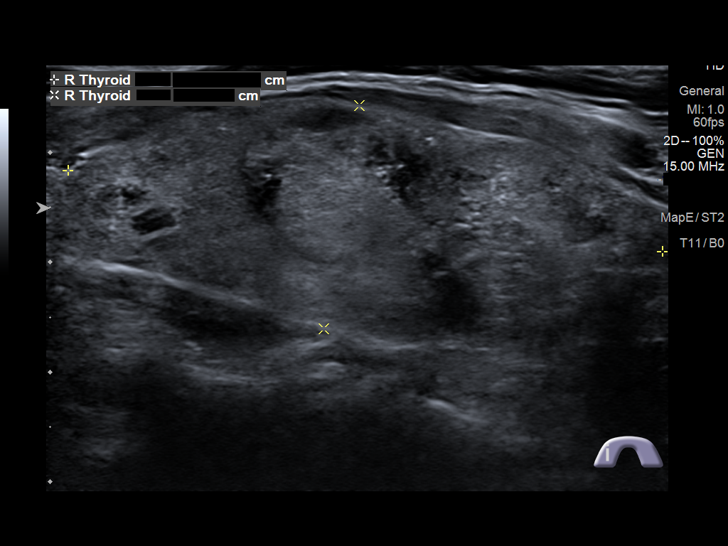
[im 11/41]
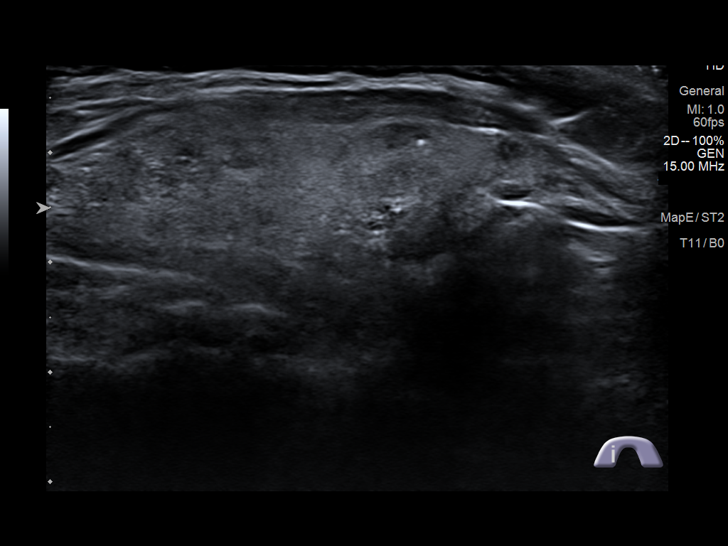
[im 14/41]
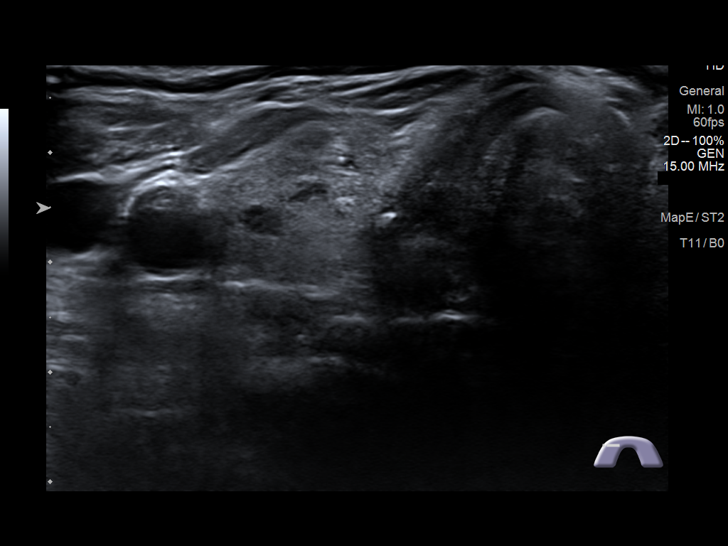
[im 17/41]
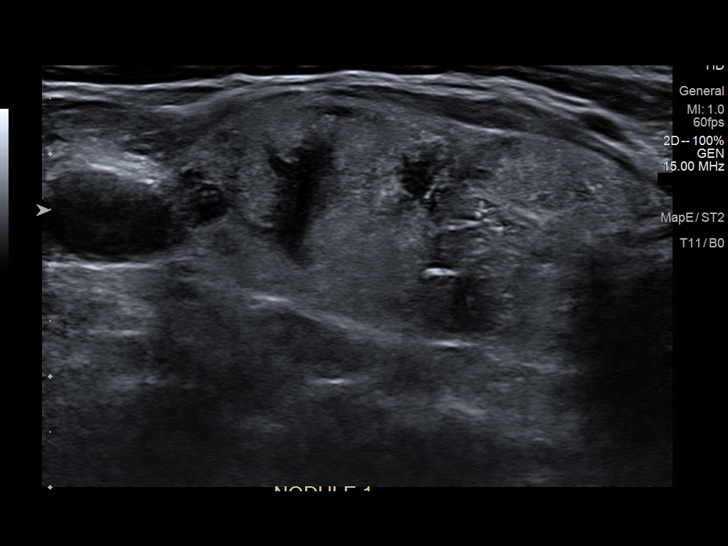
[im 21/41]
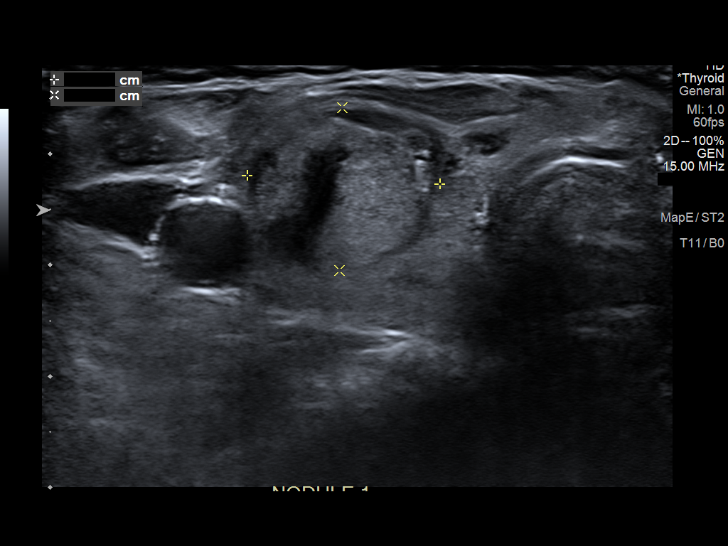
[im 24/41]
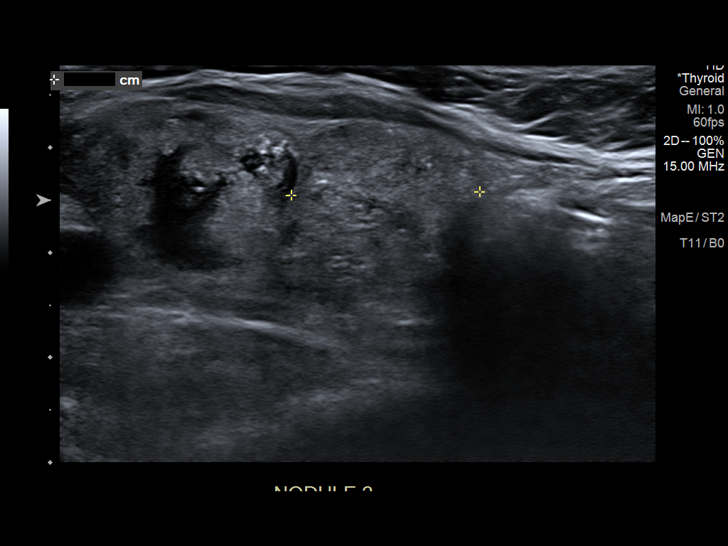
[im 27/41]
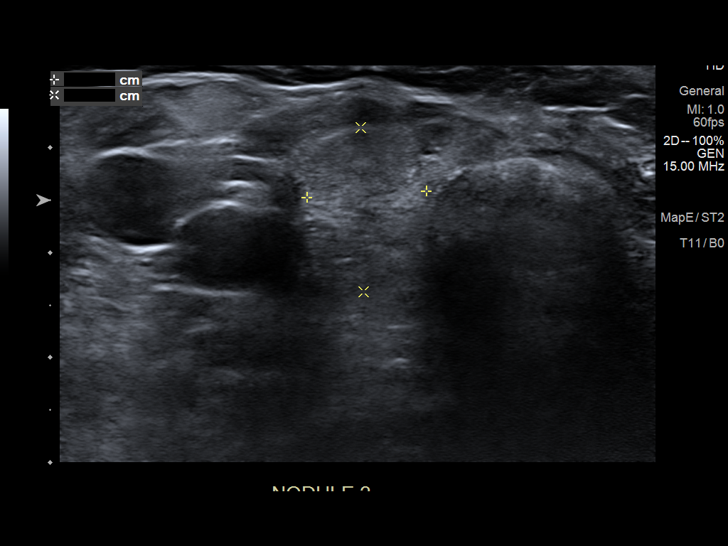
[im 31/41]
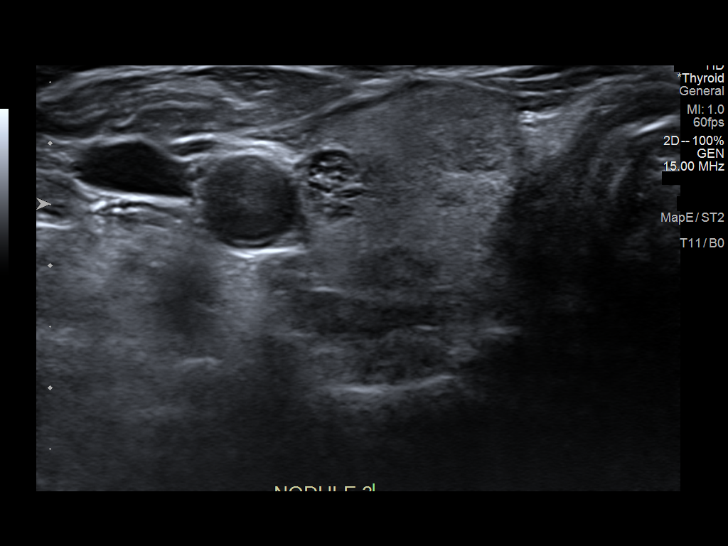
[im 34/41]
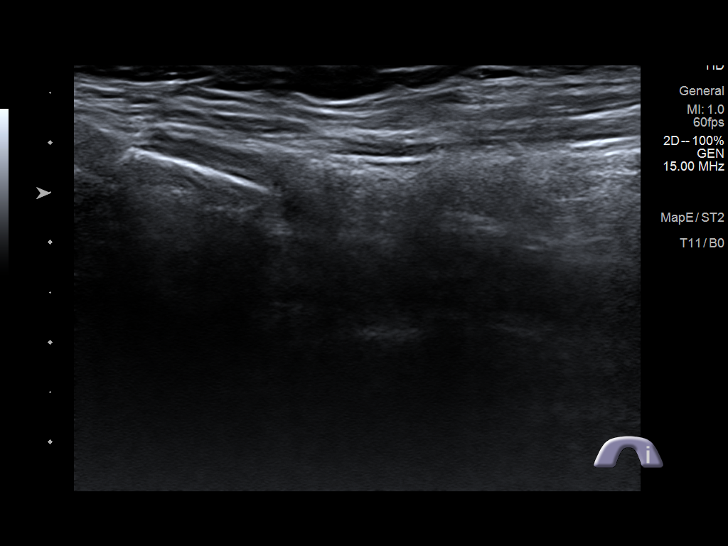
[im 37/41]
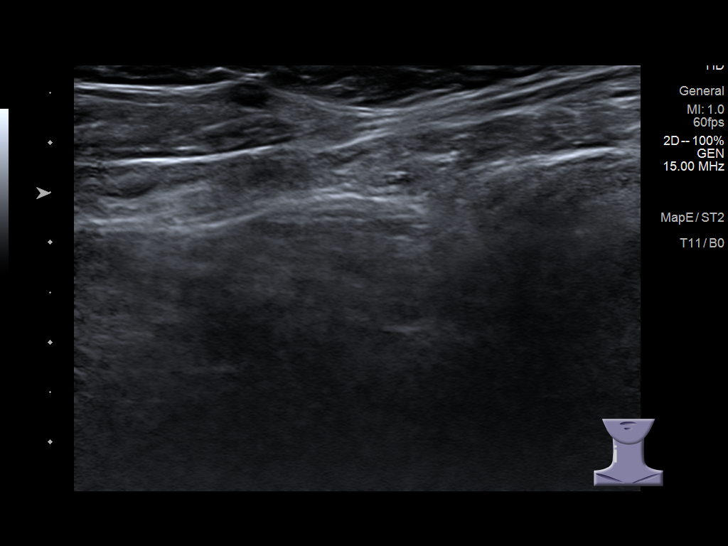
[im 41/41]
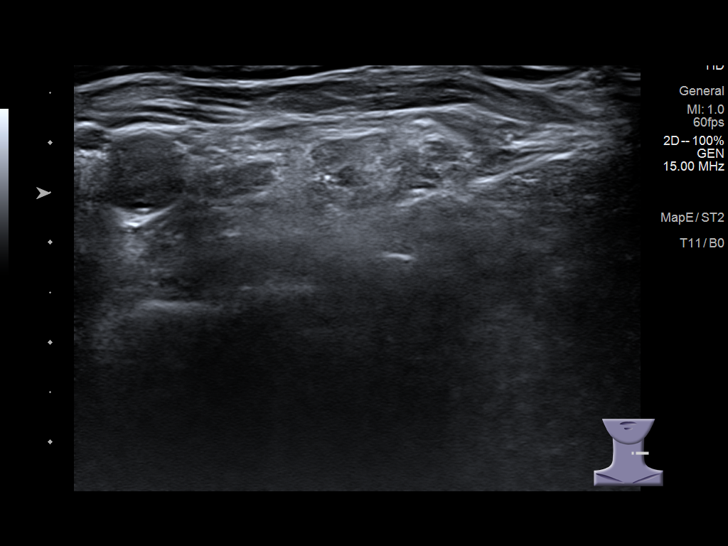

[13 of 25 positions shown; findings below may reference images not displayed]

FINDINGS: Parenchymal Echotexture: Moderately heterogenous

Isthmus: 0.3 cm thickness

Right lobe: 5.5 x 2.1 x 2.5 cm

Left lobe: Surgically absent

_________________________________________________________

Estimated total number of nodules >/= 1 cm: 2

Number of spongiform nodules >/=  2 cm not described below (TR1): 0

Number of mixed cystic and solid nodules >/= 1.5 cm not described
below (TR2): 0

_________________________________________________________

Nodule # 1:

Location: Right; Mid

Maximum size: 2.1 cm; Other 2 dimensions: 1.7 x 1.5 cm

Composition: mixed cystic and solid (1)

Echogenicity: isoechoic (1)

Shape: not taller-than-wide (0)

Margins: smooth (0)

Echogenic foci: none (0)

ACR TI-RADS total points: 2.

ACR TI-RADS risk category: TR2 (2 points).

ACR TI-RADS recommendations:

This nodule does NOT meet TI-RADS criteria for biopsy or dedicated
follow-up.

_________________________________________________________

Nodule # 2:

Location: Right; Inferior

Maximum size: 1.8 cm; Other 2 dimensions: 1.6 x 1.1 cm

Composition: solid/almost completely solid (2)

Echogenicity: isoechoic (1)

Shape: taller-than-wide (3)

Margins: ill-defined (0)

Echogenic foci: none (0)

ACR TI-RADS total points: 6.

ACR TI-RADS risk category: TR4 (4-6 points).

ACR TI-RADS recommendations:

**Given size (>/= 1.5 cm) and appearance, fine needle aspiration of
this moderately suspicious nodule should be considered based on
TI-RADS criteria.

_________________________________________________________

0.7 cm spongiform nodule, superior right; This nodule does NOT meet
TI-RADS criteria for biopsy or dedicated follow-up.
IMPRESSION: 1. No residual/recurrent tissue post left hemithyroidectomy.
2. Recommend FNA biopsy of moderately suspicious 1.8 cm inferior
right nodule.

The above is in keeping with the ACR TI-RADS recommendations - [HOSPITAL] 8664;[DATE].

## 2020-10-15 ENCOUNTER — Encounter: Payer: Self-pay | Admitting: General Surgery

## 2022-02-12 DIAGNOSIS — M1712 Unilateral primary osteoarthritis, left knee: Secondary | ICD-10-CM | POA: Insufficient documentation

## 2022-03-11 NOTE — Discharge Instructions (Signed)
Instructions after Total Knee Replacement   [name] P. Phat Dalton, Jr., M.D.     Dept. of Orthopaedics & Sports Medicine  [facility]  [address]  [place], [place]  [address]  Phone: [phone]   Fax: [phone]    DIET: Drink plenty of non-alcoholic fluids. Resume your normal diet. Include foods high in fiber.  ACTIVITY:  You may use crutches or a walker with weight-bearing as tolerated, unless instructed otherwise. You may be weaned off of the walker or crutches by your Physical Therapist.  Do NOT place pillows under the knee. Anything placed under the knee could limit your ability to straighten the knee.   Continue doing gentle exercises. Exercising will reduce the pain and swelling, increase motion, and prevent muscle weakness.   Please continue to use the TED compression stockings for 6 weeks. You may remove the stockings at night, but should reapply them in the morning. Do not drive or operate any equipment until instructed.  WOUND CARE:  Continue to use the PolarCare or ice packs periodically to reduce pain and swelling. You may bathe or shower after the staples are removed at the first office visit following surgery.  MEDICATIONS: You may resume your regular medications. Please take the pain medication as prescribed on the medication. Do not take pain medication on an empty stomach. You have been given a prescription for a blood thinner (Lovenox or Coumadin). Please take the medication as instructed. (NOTE: After completing a 2 week course of Lovenox, take one Enteric-coated aspirin once a day. This along with elevation will help reduce the possibility of phlebitis in your operated leg.) Do not drive or drink alcoholic beverages when taking pain medications.  CALL THE OFFICE FOR: Temperature above 101 degrees Excessive bleeding or drainage on the dressing. Excessive swelling, coldness, or paleness of the toes. Persistent nausea and vomiting.  FOLLOW-UP:  You  should have an appointment to return to the office in 10-14 days after surgery. Arrangements have been made for continuation of Physical Therapy (either home therapy or outpatient therapy).   [facility] Department Directory         [email or web address]       [email or web address]          Cardiology  Appointments: [place] - [phone] [place] - [phone]  Endocrinology  Appointments: [place] - [phone] [place] - [phone]  Gastroenterology  Appointments: [place] - [phone] [place] - [phone]        General Surgery   Appointments: [place] - [phone]  Internal Medicine/Family Medicine  Appointments: [place] - [phone] [place] - [phone] [place] - [phone]  Metabolic and Weigh Loss Surgery  Appointments: [place] - [phone]        Neurology  Appointments: [place] - [phone] [place] - [phone]  Neurosurgery  Appointments: [place] - [phone]  Obstetrics & Gynecology  Appointments: [place] - [phone] [place] - [phone]        Pediatrics  Appointments: [place] - [phone] [place] - [phone]  Physiatry  Appointments: [place] -[phone]  Physical Therapy  Appointments: [place] - [phone] [place] - [phone]        Podiatry  Appointments: [place] - [phone] [place] - [phone]  Pulmonology  Appointments: [place] - [phone]  Rheumatology  Appointments: [place] - [phone]         Location: [facility]  [address] , [place]  [address]  [place] Location: [facility] [address] [place], [place]  [address]  [place] Location: [facility] [address] [place], [place]  [address]    

## 2022-03-14 ENCOUNTER — Encounter
Admission: RE | Admit: 2022-03-14 | Discharge: 2022-03-14 | Disposition: A | Discharge status: Home / Self Care | Payer: Medicare Other | Source: Ambulatory Visit | Attending: Orthopedic Surgery | Admitting: Orthopedic Surgery

## 2022-03-14 ENCOUNTER — Other Ambulatory Visit: Payer: Self-pay

## 2022-03-14 VITALS — BP 133/81 | HR 108 | Resp 18 | Wt 119.3 lb

## 2022-03-14 DIAGNOSIS — R8290 Unspecified abnormal findings in urine: Secondary | ICD-10-CM | POA: Insufficient documentation

## 2022-03-14 DIAGNOSIS — Z01812 Encounter for preprocedural laboratory examination: Secondary | ICD-10-CM

## 2022-03-14 DIAGNOSIS — Z01818 Encounter for other preprocedural examination: Secondary | ICD-10-CM | POA: Diagnosis present

## 2022-03-14 DIAGNOSIS — C73 Malignant neoplasm of thyroid gland: Secondary | ICD-10-CM

## 2022-03-14 DIAGNOSIS — M1712 Unilateral primary osteoarthritis, left knee: Secondary | ICD-10-CM | POA: Diagnosis not present

## 2022-03-14 DIAGNOSIS — E890 Postprocedural hypothyroidism: Secondary | ICD-10-CM

## 2022-03-14 DIAGNOSIS — M069 Rheumatoid arthritis, unspecified: Secondary | ICD-10-CM

## 2022-03-14 HISTORY — DX: Malignant (primary) neoplasm, unspecified: C80.1

## 2022-03-14 HISTORY — DX: Gastro-esophageal reflux disease without esophagitis: K21.9

## 2022-03-14 HISTORY — DX: Hypothyroidism, unspecified: E03.9

## 2022-03-14 LAB — COMPREHENSIVE METABOLIC PANEL
ALT: 35 U/L (ref 0–44)
AST: 29 U/L (ref 15–41)
Albumin: 3.9 g/dL (ref 3.5–5.0)
Alkaline Phosphatase: 58 U/L (ref 38–126)
Anion gap: 8 (ref 5–15)
BUN: 42 mg/dL — ABNORMAL HIGH (ref 8–23)
CO2: 29 mmol/L (ref 22–32)
Calcium: 9.2 mg/dL (ref 8.9–10.3)
Chloride: 101 mmol/L (ref 98–111)
Creatinine, Ser: 1.16 mg/dL — ABNORMAL HIGH (ref 0.44–1.00)
GFR, Estimated: 46 mL/min — ABNORMAL LOW (ref >60)
Glucose, Bld: 95 mg/dL (ref 70–99)
Potassium: 3.8 mmol/L (ref 3.5–5.1)
Sodium: 138 mmol/L (ref 135–145)
Total Bilirubin: 0.7 mg/dL (ref 0.3–1.2)
Total Protein: 7.1 g/dL (ref 6.5–8.1)

## 2022-03-14 LAB — URINALYSIS, ROUTINE W REFLEX MICROSCOPIC
Bilirubin Urine: NEGATIVE
Glucose, UA: NEGATIVE mg/dL
Hgb urine dipstick: NEGATIVE
Ketones, ur: NEGATIVE mg/dL
Nitrite: NEGATIVE
Protein, ur: NEGATIVE mg/dL
Specific Gravity, Urine: 1.02 (ref 1.005–1.030)
pH: 5 (ref 5.0–8.0)

## 2022-03-14 LAB — SEDIMENTATION RATE: Sed Rate: 15 mm/hr (ref 0–30)

## 2022-03-14 LAB — C-REACTIVE PROTEIN: CRP: 0.7 mg/dL (ref <1.0)

## 2022-03-14 LAB — TYPE AND SCREEN
ABO/RH(D): A POS
Antibody Screen: NEGATIVE

## 2022-03-14 LAB — SURGICAL PCR SCREEN
MRSA, PCR: NEGATIVE
Staphylococcus aureus: NEGATIVE

## 2022-03-14 NOTE — Patient Instructions (Addendum)
Your procedure is scheduled on: 03/24/22 - Friday Report to the Registration Desk on the 1st floor of the [facility]. To find out your arrival time, please call [phone] between 1PM - 3PM on: 03/23/22 - Thursday If your arrival time is 6:00 am, do not arrive before that time as the [facility] entrance doors do not open until 6:00 am.  REMEMBER: Instructions that are not followed completely may result in serious medical risk, up to and including death; or upon the discretion of your surgeon and anesthesiologist your surgery may need to be rescheduled.  Do not eat food after midnight the night before surgery.  No gum chewing or hard candies.  You may however, drink CLEAR liquids up to 2 hours before you are scheduled to arrive for your surgery. Do not drink anything within 2 hours of your scheduled arrival time.  Clear liquids include: - water  - apple juice without pulp - gatorade (not RED colors) - black coffee or tea (Do NOT add milk or creamers to the coffee or tea) Do NOT drink anything that is not on this list.  In addition, your doctor has ordered for you to drink the provided:  Ensure Pre-Surgery Clear Carbohydrate Drink  Drinking this carbohydrate drink up to two hours before surgery helps to reduce insulin resistance and improve patient outcomes. Please complete drinking 2 hours before scheduled arrival time.  One week prior to surgery: Stop Anti-inflammatories (NSAIDS) such as Advil, Aleve, Ibuprofen, Motrin, Naproxen, Naprosyn and Aspirin based products such as Excedrin, Goody's Powder, BC Powder.  Stop beginning 03/17/22 , ANY OVER THE COUNTER supplements until after surgery.  You may however, continue to take Tylenol and Tramadol if needed for pain up until the day of surgery.   TAKE ONLY THESE MEDICATIONS THE MORNING OF SURGERY WITH A SIP OF WATER:  levothyroxine (SYNTHROID)  pantoprazole (PROTONIX) take 1 on the night before surgery and take 1 the morning  of.  No Alcohol for 24 hours before or after surgery.  No Smoking including e-cigarettes for 24 hours before surgery.  No chewable tobacco products for at least 6 hours before surgery.  No nicotine patches on the day of surgery.  Do not use any "recreational" drugs for at least a week (preferably 2 weeks) before your surgery.  Please be advised that the combination of cocaine and anesthesia may have negative outcomes, up to and including death. If you test positive for cocaine, your surgery will be cancelled.  On the morning of surgery brush your teeth with toothpaste and water, you may rinse your mouth with mouthwash if you wish. Do not swallow any toothpaste or mouthwash.  Use CHG Soap or wipes as directed on instruction sheet.  Do not wear jewelry, make-up, hairpins, clips or nail polish.  Do not wear lotions, powders, or perfumes.   Do not shave body hair from the neck down 48 hours before surgery.  Contact lenses, hearing aids and dentures may not be worn into surgery.  Do not bring valuables to the hospital. [facility] is not responsible for any missing/lost belongings or valuables.   Notify your doctor if there is any change in your medical condition (cold, fever, infection).  Wear comfortable clothing (specific to your surgery type) to the hospital.  After surgery, you can help prevent lung complications by doing breathing exercises.  Take deep breaths and cough every 1-2 hours. Your doctor may order a device called an Incentive Spirometer to help you take deep breaths.  When coughing or sneezing, hold a pillow firmly against your incision with both hands. This is called "splinting." Doing this helps protect your incision. It also decreases belly discomfort.  If you are being admitted to the hospital overnight, leave your suitcase in the car. After surgery it may be brought to your room.  In case of increased patient census, it may be necessary for you, the patient, to  continue your postoperative care in the Same Day Surgery department.  If you are being discharged the day of surgery, you will not be allowed to drive home. You will need a responsible individual to drive you home and stay with you for 24 hours after surgery.   If you are taking public transportation, you will need to have a responsible individual with you.  Please call the [place] Dept. at [phone] if you have any questions about these instructions.  Surgery Visitation Policy:  Patients undergoing a surgery or procedure may have two family members or support persons with them as long as the person is not COVID-19 positive or experiencing its symptoms.   Inpatient Visitation:    Visiting hours are 7 a.m. to 8 p.m. Up to four visitors are allowed at one time in a patient room. The visitors may rotate out with other people during the day. One designated support person (adult) may remain overnight.  Due to an increase in RSV and influenza rates and associated hospitalizations, children ages 58 and under will not be able to visit patients in [facility]. Masks continue to be strongly recommended.    Preparing for Surgery with CHLORHEXIDINE GLUCONATE (CHG) Soap  Chlorhexidine Gluconate (CHG) Soap  o An antiseptic cleaner that kills germs and bonds with the skin to continue killing germs even after washing  o Used for showering the night before surgery and morning of surgery  Before surgery, you can play an important role by reducing the number of germs on your skin.  CHG (Chlorhexidine gluconate) soap is an antiseptic cleanser which kills germs and bonds with the skin to continue killing germs even after washing.  Please do not use if you have an allergy to CHG or antibacterial soaps. If your skin becomes reddened/irritated stop using the CHG.  1. Shower the NIGHT BEFORE SURGERY and the MORNING OF SURGERY with CHG soap.  2. If you choose to wash your  hair, wash your hair first as usual with your normal shampoo.  3. After shampooing, rinse your hair and body thoroughly to remove the shampoo.  4. Use CHG as you would any other liquid soap. You can apply CHG directly to the skin and wash gently with a scrungie or a clean washcloth.  5. Apply the CHG soap to your body only from the neck down. Do not use on open wounds or open sores. Avoid contact with your eyes, ears, mouth, and genitals (private parts). Wash face and genitals (private parts) with your normal soap.  6. Wash thoroughly, paying special attention to the area where your surgery will be performed.  7. Thoroughly rinse your body with warm water.  8. Do not shower/wash with your normal soap after using and rinsing off the CHG soap.  9. Pat yourself dry with a clean towel.  10. Wear clean pajamas to bed the night before surgery.  12. Place clean sheets on your bed the night of your first shower and do not sleep with pets.  13. Shower again with the CHG soap on the day  of surgery prior to arriving at the hospital.  14. Do not apply any deodorants/lotions/powders.  15. Please wear clean clothes to the hospital.  How to Use an Incentive Spirometer  An incentive spirometer is a tool that measures how well you are filling your lungs with each breath. Learning to take long, deep breaths using this tool can help you keep your lungs clear and active. This may help to reverse or lessen your chance of developing breathing (pulmonary) problems, especially infection. You may be asked to use a spirometer: After a surgery. If you have a lung problem or a history of smoking. After a long period of time when you have been unable to move or be active. If the spirometer includes an indicator to show the highest number that you have reached, your health care provider or respiratory therapist will help you set a goal. Keep a log of your progress as told by your health care provider. What are  the risks? Breathing too quickly may cause dizziness or cause you to pass out. Take your time so you do not get dizzy or light-headed. If you are in pain, you may need to take pain medicine before doing incentive spirometry. It is harder to take a deep breath if you are having pain. How to use your incentive spirometer  Sit up on the edge of your bed or on a chair. Hold the incentive spirometer so that it is in an upright position. Before you use the spirometer, breathe out normally. Place the mouthpiece in your mouth. Make sure your lips are closed tightly around it. Breathe in slowly and as deeply as you can through your mouth, causing the piston or the ball to rise toward the top of the chamber. Hold your breath for 3-5 seconds, or for as long as possible. If the spirometer includes a coach indicator, use this to guide you in breathing. Slow down your breathing if the indicator goes above the marked areas. Remove the mouthpiece from your mouth and breathe out normally. The piston or ball will return to the bottom of the chamber. Rest for a few seconds, then repeat the steps 10 or more times. Take your time and take a few normal breaths between deep breaths so that you do not get dizzy or light-headed. Do this every 1-2 hours when you are awake. If the spirometer includes a goal marker to show the highest number you have reached (best effort), use this as a goal to work toward during each repetition. After each set of 10 deep breaths, cough a few times. This will help to make sure that your lungs are clear. If you have an incision on your chest or abdomen from surgery, place a pillow or a rolled-up towel firmly against the incision when you cough. This can help to reduce pain while taking deep breaths and coughing. General tips When you are able to get out of bed: Walk around often. Continue to take deep breaths and cough in order to clear your lungs. Keep using the incentive spirometer  until your health care provider says it is okay to stop using it. If you have been in the hospital, you may be told to keep using the spirometer at home. Contact a health care provider if: You are having difficulty using the spirometer. You have trouble using the spirometer as often as instructed. Your pain medicine is not giving enough relief for you to use the spirometer as told. You have a fever. Get help  right away if: You develop shortness of breath. You develop a cough with bloody mucus from the lungs. You have fluid or blood coming from an incision site after you cough. Summary An incentive spirometer is a tool that can help you learn to take long, deep breaths to keep your lungs clear and active. You may be asked to use a spirometer after a surgery, if you have a lung problem or a history of smoking, or if you have been inactive for a long period of time. Use your incentive spirometer as instructed every 1-2 hours while you are awake. If you have an incision on your chest or abdomen, place a pillow or a rolled-up towel firmly against your incision when you cough. This will help to reduce pain. Get help right away if you have shortness of breath, you cough up bloody mucus, or blood comes from your incision when you cough. This information is not intended to replace advice given to you by your health care provider. Make sure you discuss any questions you have with your health care provider. Document Revised: 03/17/2019 Document Reviewed: 03/17/2019 Elsevier Patient Education  [place].

## 2022-03-14 NOTE — Progress Notes (Signed)
  Perioperative Services Pre-Admission/Anesthesia Testing    Date: 03/14/22  Name: [name] MRN:   [id number]  Re: Preoperative ECG   Planned Surgical Procedure(s):    Case: [id number] Date/Time: 03/24/22 0715   Procedure: COMPUTER ASSISTED TOTAL KNEE ARTHROPLASTY - RNFA (Left: Knee)   Anesthesia type: Choice   Pre-op diagnosis: PRIMARY OSTEOARTHRITIS OF LEFT KNEE.   Location: [facility] OR ROOM 06 / [place] [facility]   Surgeons: Monica Leep, MD      Clinical Notes:  Patient is scheduled for the above procedure on 03/24/2022 with Dr. Skip Estimable, MD. In preparation for her procedure, patient presented to the PAT clinic on the afternoon of 03/14/2022 for preoperative labs and ECG testing.  Patient's preoperative ECG showing sinus tachycardia with frequent PVCs at a rate of 141 bpm.  Previous tracing obtained on 11/21/2019 showed normal sinus rhythm at a rate of 93 bpm.  In speaking with the preoperative nurse, patient reported to have been anxious during PAT appointment.  Patient reportedly forgot her purse and had to call her daughter to bring it to her.  She was "stressed" as she was approximately 20 minutes late for her appointment.  Patient did not have any complaints during her appointment with PAT staff; no angina/anginal equivalent symptoms.  Patient does not have a significant cardiovascular history.  In review of her EMR, I do not see evidence of any previous cardiovascular testing.  Vital Signs: BP 133/81   Pulse (!) 108   Resp 18   Wt 54.1 kg   SpO2 97%   BMI 22.54 kg/m   ECG:    Impression and Plan:  [name] scheduled for elective orthopedic surgery in the near future.  Preoperative ECG revealed sinus tachycardia with PVCs at a rate of 141 bpm.  Query stress response as patient was late for her appointment.  Again, patient has no angina/anginal equivalent symptoms during her appointment.  Patient was normotensive in the clinic.  Sending tracing over  to PCP for review.  Given her heart rate, PCP may elect to have patient be seen prior to surgery to ensure normalization of her rate.  Will attach medical clearance for physician signature.  Copy of note forwarded to patient's PCP and primary attending surgeon for review.  Monica Loh, MSN, APRN, FNP-C, CEN [facility]  Peri-operative Services Nurse Practitioner Phone: [phone] 03/14/22 3:03 PM  NOTE: This note has been prepared using Dragon dictation software. Despite my best ability to proofread, there is always the potential that unintentional transcriptional errors may still occur from this process.

## 2022-03-17 LAB — URINE CULTURE: Culture: 50000 — AB

## 2022-03-23 MED ORDER — LACTATED RINGERS IV SOLN: INTRAVENOUS | Status: DC

## 2022-03-23 MED ORDER — GABAPENTIN 300 MG PO CAPS: 300.0000 mg | ORAL_CAPSULE | Freq: Once | ORAL | Status: AC

## 2022-03-23 MED ORDER — TRANEXAMIC ACID-NACL 1000-0.7 MG/100ML-% IV SOLN
1000.0000 mg | INTRAVENOUS | Status: AC
  Administered 2022-03-24: 1000 mg via INTRAVENOUS

## 2022-03-23 MED ORDER — CEFAZOLIN SODIUM-DEXTROSE 2-4 GM/100ML-% IV SOLN
2.0000 g | INTRAVENOUS | Status: AC
  Administered 2022-03-24: 2 g via INTRAVENOUS

## 2022-03-23 MED ORDER — CHLORHEXIDINE GLUCONATE 4 % EX LIQD: 60.0000 mL | Freq: Once | CUTANEOUS | Status: DC

## 2022-03-23 MED ORDER — CELECOXIB 200 MG PO CAPS: 400.0000 mg | ORAL_CAPSULE | Freq: Once | ORAL | Status: AC

## 2022-03-23 MED ORDER — ORAL CARE MOUTH RINSE: 15.0000 mL | Freq: Once | OROMUCOSAL | Status: DC

## 2022-03-23 MED ORDER — DEXAMETHASONE SODIUM PHOSPHATE 10 MG/ML IJ SOLN: 8.0000 mg | Freq: Once | INTRAMUSCULAR | Status: AC

## 2022-03-23 MED ORDER — CHLORHEXIDINE GLUCONATE 0.12 % MT SOLN: 15.0000 mL | Freq: Once | OROMUCOSAL | Status: DC

## 2022-03-23 MED ORDER — FAMOTIDINE 20 MG PO TABS: 20.0000 mg | ORAL_TABLET | Freq: Once | ORAL | Status: DC

## 2022-03-24 ENCOUNTER — Other Ambulatory Visit: Payer: Self-pay

## 2022-03-24 ENCOUNTER — Encounter
Admission: RE | Disposition: A | Discharge status: Home / Self Care | Payer: Self-pay | Source: Home / Self Care | Attending: Orthopedic Surgery

## 2022-03-24 ENCOUNTER — Observation Stay
Admission: RE | Admit: 2022-03-24 | Discharge: 2022-03-25 | Disposition: A | Discharge status: Home / Self Care | Payer: Medicare Other | Attending: Orthopedic Surgery | Admitting: Orthopedic Surgery

## 2022-03-24 ENCOUNTER — Ambulatory Visit: Payer: Medicare Other | Admitting: Urgent Care

## 2022-03-24 ENCOUNTER — Observation Stay: Payer: Medicare Other

## 2022-03-24 ENCOUNTER — Encounter: Payer: Self-pay | Admitting: Orthopedic Surgery

## 2022-03-24 DIAGNOSIS — Z79899 Other long term (current) drug therapy: Secondary | ICD-10-CM | POA: Insufficient documentation

## 2022-03-24 DIAGNOSIS — Z96659 Presence of unspecified artificial knee joint: Secondary | ICD-10-CM

## 2022-03-24 DIAGNOSIS — J45909 Unspecified asthma, uncomplicated: Secondary | ICD-10-CM | POA: Diagnosis not present

## 2022-03-24 DIAGNOSIS — M1712 Unilateral primary osteoarthritis, left knee: Principal | ICD-10-CM

## 2022-03-24 DIAGNOSIS — I10 Essential (primary) hypertension: Secondary | ICD-10-CM | POA: Diagnosis not present

## 2022-03-24 DIAGNOSIS — Z7982 Long term (current) use of aspirin: Secondary | ICD-10-CM | POA: Insufficient documentation

## 2022-03-24 DIAGNOSIS — C73 Malignant neoplasm of thyroid gland: Secondary | ICD-10-CM

## 2022-03-24 DIAGNOSIS — R8290 Unspecified abnormal findings in urine: Secondary | ICD-10-CM

## 2022-03-24 DIAGNOSIS — E890 Postprocedural hypothyroidism: Secondary | ICD-10-CM

## 2022-03-24 DIAGNOSIS — M069 Rheumatoid arthritis, unspecified: Secondary | ICD-10-CM

## 2022-03-24 DIAGNOSIS — Z85850 Personal history of malignant neoplasm of thyroid: Secondary | ICD-10-CM | POA: Insufficient documentation

## 2022-03-24 DIAGNOSIS — E039 Hypothyroidism, unspecified: Secondary | ICD-10-CM | POA: Diagnosis not present

## 2022-03-24 DIAGNOSIS — Z01812 Encounter for preprocedural laboratory examination: Secondary | ICD-10-CM

## 2022-03-24 HISTORY — PX: KNEE ARTHROPLASTY: SHX992

## 2022-03-24 LAB — ABO/RH: ABO/RH(D): A POS

## 2022-03-24 SURGERY — ARTHROPLASTY, KNEE, TOTAL, USING IMAGELESS COMPUTER-ASSISTED NAVIGATION
Anesthesia: Spinal | Site: Knee | Laterality: Left

## 2022-03-24 MED ORDER — METOCLOPRAMIDE HCL 5 MG PO TABS
10.0000 mg | ORAL_TABLET | Freq: Three times a day (TID) | ORAL | Status: DC
  Filled 2022-03-24 (×2): qty 2

## 2022-03-24 MED ORDER — GABAPENTIN 300 MG PO CAPS
ORAL_CAPSULE | ORAL | Status: AC
  Administered 2022-03-24: 300 mg via ORAL
  Filled 2022-03-24: qty 1

## 2022-03-24 MED ORDER — ALUM & MAG HYDROXIDE-SIMETH 200-200-20 MG/5ML PO SUSP: 30.0000 mL | ORAL | Status: DC | PRN

## 2022-03-24 MED ORDER — BISACODYL 10 MG RE SUPP: 10.0000 mg | Freq: Every day | RECTAL | Status: DC | PRN

## 2022-03-24 MED ORDER — ACETAMINOPHEN 325 MG PO TABS: 325.0000 mg | ORAL_TABLET | Freq: Four times a day (QID) | ORAL | Status: DC | PRN

## 2022-03-24 MED ORDER — PROPOFOL 1000 MG/100ML IV EMUL
INTRAVENOUS | Status: AC
  Filled 2022-03-24: qty 100

## 2022-03-24 MED ORDER — SENNOSIDES-DOCUSATE SODIUM 8.6-50 MG PO TABS
1.0000 | ORAL_TABLET | Freq: Two times a day (BID) | ORAL | Status: DC
  Administered 2022-03-24: 1 via ORAL
  Filled 2022-03-24 (×2): qty 1

## 2022-03-24 MED ORDER — PHENYLEPHRINE HCL-NACL 20-0.9 MG/250ML-% IV SOLN
INTRAVENOUS | Status: AC
  Filled 2022-03-24: qty 250

## 2022-03-24 MED ORDER — TRANEXAMIC ACID-NACL 1000-0.7 MG/100ML-% IV SOLN: 1000.0000 mg | Freq: Once | INTRAVENOUS | Status: AC

## 2022-03-24 MED ORDER — OXYCODONE HCL 5 MG PO TABS
ORAL_TABLET | ORAL | Status: AC
  Filled 2022-03-24: qty 1

## 2022-03-24 MED ORDER — SODIUM CHLORIDE 0.9 % IV SOLN: INTRAVENOUS | Status: DC

## 2022-03-24 MED ORDER — CELECOXIB 200 MG PO CAPS
200.0000 mg | ORAL_CAPSULE | Freq: Two times a day (BID) | ORAL | Status: DC
  Administered 2022-03-24 – 2022-03-25 (×2): 200 mg via ORAL
  Filled 2022-03-24 (×2): qty 1

## 2022-03-24 MED ORDER — OXYCODONE HCL 5 MG PO TABS
ORAL_TABLET | ORAL | Status: AC
  Administered 2022-03-25: 10 mg via ORAL
  Filled 2022-03-24: qty 1

## 2022-03-24 MED ORDER — FLEET ENEMA 7-19 GM/118ML RE ENEM: 1.0000 | ENEMA | Freq: Once | RECTAL | Status: DC | PRN

## 2022-03-24 MED ORDER — TRAMADOL HCL 50 MG PO TABS
ORAL_TABLET | ORAL | Status: AC
  Administered 2022-03-25: 50 mg via ORAL
  Filled 2022-03-24: qty 1

## 2022-03-24 MED ORDER — ENOXAPARIN SODIUM 30 MG/0.3ML IJ SOSY
30.0000 mg | PREFILLED_SYRINGE | Freq: Two times a day (BID) | INTRAMUSCULAR | Status: DC
  Administered 2022-03-25: 30 mg via SUBCUTANEOUS
  Filled 2022-03-24: qty 0.3

## 2022-03-24 MED ORDER — OXYCODONE HCL 5 MG PO TABS
5.0000 mg | ORAL_TABLET | ORAL | Status: DC | PRN
  Administered 2022-03-24: 5 mg via ORAL

## 2022-03-24 MED ORDER — SODIUM CHLORIDE (PF) 0.9 % IJ SOLN
INTRAMUSCULAR | Status: DC | PRN
  Administered 2022-03-24: 120 mL via INTRAMUSCULAR

## 2022-03-24 MED ORDER — ONDANSETRON HCL 4 MG/2ML IJ SOLN: 4.0000 mg | Freq: Four times a day (QID) | INTRAMUSCULAR | Status: DC | PRN

## 2022-03-24 MED ORDER — DIPHENHYDRAMINE HCL 12.5 MG/5ML PO ELIX: 12.5000 mg | ORAL_SOLUTION | ORAL | Status: DC | PRN

## 2022-03-24 MED ORDER — TRANEXAMIC ACID-NACL 1000-0.7 MG/100ML-% IV SOLN
INTRAVENOUS | Status: AC
  Administered 2022-03-24: 1000 mg via INTRAVENOUS
  Filled 2022-03-24: qty 100

## 2022-03-24 MED ORDER — FENTANYL CITRATE (PF) 100 MCG/2ML IJ SOLN
INTRAMUSCULAR | Status: DC | PRN
  Administered 2022-03-24 (×4): 25 ug via INTRAVENOUS

## 2022-03-24 MED ORDER — OXYCODONE HCL 5 MG PO TABS
10.0000 mg | ORAL_TABLET | ORAL | Status: DC | PRN
  Filled 2022-03-24: qty 2

## 2022-03-24 MED ORDER — CEFAZOLIN SODIUM-DEXTROSE 2-4 GM/100ML-% IV SOLN
INTRAVENOUS | Status: AC
  Filled 2022-03-24: qty 100

## 2022-03-24 MED ORDER — CEFAZOLIN SODIUM-DEXTROSE 2-4 GM/100ML-% IV SOLN
2.0000 g | Freq: Four times a day (QID) | INTRAVENOUS | Status: AC
  Administered 2022-03-24 (×2): 2 g via INTRAVENOUS
  Filled 2022-03-24 (×3): qty 100

## 2022-03-24 MED ORDER — OXYCODONE HCL 5 MG PO TABS
5.0000 mg | ORAL_TABLET | Freq: Once | ORAL | Status: AC | PRN
  Administered 2022-03-24: 5 mg via ORAL

## 2022-03-24 MED ORDER — ENSURE PRE-SURGERY PO LIQD
296.0000 mL | Freq: Once | ORAL | Status: DC
  Filled 2022-03-24: qty 296

## 2022-03-24 MED ORDER — SODIUM CHLORIDE FLUSH 0.9 % IV SOLN
INTRAVENOUS | Status: AC
  Filled 2022-03-24: qty 70

## 2022-03-24 MED ORDER — BUPIVACAINE HCL (PF) 0.5 % IJ SOLN
INTRAMUSCULAR | Status: AC
  Filled 2022-03-24: qty 10

## 2022-03-24 MED ORDER — PROPOFOL 500 MG/50ML IV EMUL
INTRAVENOUS | Status: DC | PRN
  Administered 2022-03-24: 35 ug/kg/min via INTRAVENOUS

## 2022-03-24 MED ORDER — MENTHOL 3 MG MT LOZG: 1.0000 | LOZENGE | OROMUCOSAL | Status: DC | PRN

## 2022-03-24 MED ORDER — ACETAMINOPHEN 10 MG/ML IV SOLN
1000.0000 mg | Freq: Four times a day (QID) | INTRAVENOUS | Status: DC
  Administered 2022-03-24 – 2022-03-25 (×2): 1000 mg via INTRAVENOUS
  Filled 2022-03-24 (×2): qty 100

## 2022-03-24 MED ORDER — PHENYLEPHRINE HCL-NACL 20-0.9 MG/250ML-% IV SOLN
INTRAVENOUS | Status: DC | PRN
  Administered 2022-03-24: 15 ug/min via INTRAVENOUS

## 2022-03-24 MED ORDER — BUPIVACAINE HCL (PF) 0.25 % IJ SOLN
INTRAMUSCULAR | Status: AC
  Filled 2022-03-24: qty 120

## 2022-03-24 MED ORDER — FENTANYL CITRATE (PF) 100 MCG/2ML IJ SOLN
25.0000 ug | INTRAMUSCULAR | Status: DC | PRN
  Administered 2022-03-24: 25 ug via INTRAVENOUS

## 2022-03-24 MED ORDER — ONDANSETRON HCL 4 MG PO TABS: 4.0000 mg | ORAL_TABLET | Freq: Four times a day (QID) | ORAL | Status: DC | PRN

## 2022-03-24 MED ORDER — CHLORHEXIDINE GLUCONATE 0.12 % MT SOLN
OROMUCOSAL | Status: AC
  Administered 2022-03-24: 15 mL
  Filled 2022-03-24: qty 15

## 2022-03-24 MED ORDER — BUPIVACAINE HCL (PF) 0.5 % IJ SOLN
INTRAMUSCULAR | Status: DC | PRN
  Administered 2022-03-24: 2.8 mL

## 2022-03-24 MED ORDER — DEXAMETHASONE SODIUM PHOSPHATE 10 MG/ML IJ SOLN
INTRAMUSCULAR | Status: AC
  Administered 2022-03-24: 8 mg via INTRAVENOUS
  Filled 2022-03-24: qty 1

## 2022-03-24 MED ORDER — TRAMADOL HCL 50 MG PO TABS
50.0000 mg | ORAL_TABLET | ORAL | Status: DC | PRN
  Administered 2022-03-24: 50 mg via ORAL
  Filled 2022-03-24: qty 1

## 2022-03-24 MED ORDER — HYDROCHLOROTHIAZIDE 25 MG PO TABS
25.0000 mg | ORAL_TABLET | Freq: Every day | ORAL | Status: DC
  Administered 2022-03-24 – 2022-03-25 (×2): 25 mg via ORAL
  Filled 2022-03-24 (×2): qty 1

## 2022-03-24 MED ORDER — OXYCODONE HCL 5 MG/5ML PO SOLN: 5.0000 mg | Freq: Once | ORAL | Status: AC | PRN

## 2022-03-24 MED ORDER — PHENYLEPHRINE HCL-NACL 20-0.9 MG/250ML-% IV SOLN: INTRAVENOUS | Status: DC | PRN

## 2022-03-24 MED ORDER — GLYCOPYRROLATE 0.2 MG/ML IJ SOLN
INTRAMUSCULAR | Status: AC
  Filled 2022-03-24: qty 1

## 2022-03-24 MED ORDER — ACETAMINOPHEN 10 MG/ML IV SOLN
INTRAVENOUS | Status: AC
  Filled 2022-03-24: qty 100

## 2022-03-24 MED ORDER — SURGIPHOR WOUND IRRIGATION SYSTEM - OPTIME: TOPICAL | Status: DC | PRN

## 2022-03-24 MED ORDER — FENTANYL CITRATE (PF) 100 MCG/2ML IJ SOLN
INTRAMUSCULAR | Status: AC
  Filled 2022-03-24: qty 2

## 2022-03-24 MED ORDER — BUPIVACAINE LIPOSOME 1.3 % IJ SUSP
INTRAMUSCULAR | Status: AC
  Filled 2022-03-24: qty 40

## 2022-03-24 MED ORDER — SODIUM CHLORIDE 0.9 % IR SOLN
Status: DC | PRN
  Administered 2022-03-24: 3000 mL

## 2022-03-24 MED ORDER — LEVOTHYROXINE SODIUM 50 MCG PO TABS
75.0000 ug | ORAL_TABLET | Freq: Every day | ORAL | Status: DC
  Administered 2022-03-25: 75 ug via ORAL
  Filled 2022-03-24: qty 1

## 2022-03-24 MED ORDER — FOLIC ACID 1 MG PO TABS
1.0000 mg | ORAL_TABLET | Freq: Every day | ORAL | Status: DC
  Administered 2022-03-24 – 2022-03-25 (×2): 1 mg via ORAL
  Filled 2022-03-24 (×2): qty 1

## 2022-03-24 MED ORDER — PHENOL 1.4 % MT LIQD: 1.0000 | OROMUCOSAL | Status: DC | PRN

## 2022-03-24 MED ORDER — PANTOPRAZOLE SODIUM 40 MG PO TBEC
40.0000 mg | DELAYED_RELEASE_TABLET | Freq: Two times a day (BID) | ORAL | Status: DC
  Administered 2022-03-24 – 2022-03-25 (×2): 40 mg via ORAL
  Filled 2022-03-24 (×2): qty 1

## 2022-03-24 MED ORDER — FAMOTIDINE 20 MG PO TABS
ORAL_TABLET | ORAL | Status: AC
  Filled 2022-03-24: qty 1

## 2022-03-24 MED ORDER — TRANEXAMIC ACID-NACL 1000-0.7 MG/100ML-% IV SOLN
INTRAVENOUS | Status: AC
  Filled 2022-03-24: qty 100

## 2022-03-24 MED ORDER — PROPOFOL 10 MG/ML IV BOLUS
INTRAVENOUS | Status: DC | PRN
  Administered 2022-03-24 (×2): 20 mg via INTRAVENOUS

## 2022-03-24 MED ORDER — CELECOXIB 200 MG PO CAPS
ORAL_CAPSULE | ORAL | Status: AC
  Administered 2022-03-24: 400 mg via ORAL
  Filled 2022-03-24: qty 2

## 2022-03-24 MED ORDER — MAGNESIUM HYDROXIDE 400 MG/5ML PO SUSP
30.0000 mL | Freq: Every day | ORAL | Status: DC
  Administered 2022-03-24: 30 mL via ORAL
  Filled 2022-03-24 (×2): qty 30

## 2022-03-24 MED ORDER — HYDROMORPHONE HCL 1 MG/ML IJ SOLN: 0.5000 mg | INTRAMUSCULAR | Status: DC | PRN

## 2022-03-24 SURGICAL SUPPLY — 72 items
ATTUNE PS FEM LT SZ 5 CEM KNEE (Femur) IMPLANT
ATTUNE PSRP INSR SZ5 6 KNEE (Insert) IMPLANT
BASEPLATE TIBIAL ROTATING SZ 4 (Knees) IMPLANT
BATTERY INSTRU NAVIGATION (MISCELLANEOUS) ×4 IMPLANT
BLADE CLIPPER SURG (BLADE) IMPLANT
BLADE SAW 70X12.5 (BLADE) ×1 IMPLANT
BLADE SAW 90X13X1.19 OSCILLAT (BLADE) ×1 IMPLANT
BLADE SAW 90X25X1.19 OSCILLAT (BLADE) ×1 IMPLANT
BONE CEMENT GENTAMICIN (Cement) ×2 IMPLANT
BSPLAT TIB 4 CMNT ROT PLAT STR (Knees) ×1 IMPLANT
BTRY SRG DRVR LF (MISCELLANEOUS) ×4
CEMENT BONE GENTAMICIN 40 (Cement) IMPLANT
COOLER POLAR GLACIER W/PUMP (MISCELLANEOUS) ×1 IMPLANT
CUFF TOURN SGL QUICK 24 (TOURNIQUET CUFF)
CUFF TOURN SGL QUICK 34 (TOURNIQUET CUFF)
CUFF TRNQT CYL 24X4X16.5-23 (TOURNIQUET CUFF) IMPLANT
CUFF TRNQT CYL 34X4.125X (TOURNIQUET CUFF) IMPLANT
DRAPE 3/4 80X56 (DRAPES) ×1 IMPLANT
DRAPE INCISE IOBAN 66X45 STRL (DRAPES) IMPLANT
DRSG MEPILEX SACRM 8.7X9.8 (GAUZE/BANDAGES/DRESSINGS) ×1 IMPLANT
DRSG NON-ADHERENT DERMACEA 3X4 (GAUZE/BANDAGES/DRESSINGS) ×1 IMPLANT
DRSG OPSITE POSTOP 4X14 (GAUZE/BANDAGES/DRESSINGS) ×1 IMPLANT
DRSG TEGADERM 4X4.75 (GAUZE/BANDAGES/DRESSINGS) ×1 IMPLANT
DURAPREP 26ML APPLICATOR (WOUND CARE) ×2 IMPLANT
ELECT CAUTERY BLADE 6.4 (BLADE) ×1 IMPLANT
ELECT REM PT RETURN 9FT ADLT (ELECTROSURGICAL) ×1
ELECTRODE REM PT RTRN 9FT ADLT (ELECTROSURGICAL) ×1 IMPLANT
EX-PIN ORTHOLOCK NAV 4X150 (PIN) ×2 IMPLANT
GLOVE BIOGEL M STRL SZ7.5 (GLOVE) ×2 IMPLANT
GLOVE SRG 8 PF TXTR STRL LF DI (GLOVE) ×1 IMPLANT
GLOVE SURG UNDER POLY LF SZ8 (GLOVE) ×1
GOWN STRL REUS W/ TWL LRG LVL3 (GOWN DISPOSABLE) ×1 IMPLANT
GOWN STRL REUS W/TWL LRG LVL3 (GOWN DISPOSABLE) ×1
GOWN TOGA ZIPPER T7+ PEEL AWAY (MISCELLANEOUS) ×1 IMPLANT
HANDLE YANKAUER SUCT OPEN TIP (MISCELLANEOUS) ×1 IMPLANT
HEMOVAC 400CC 10FR (MISCELLANEOUS) ×1 IMPLANT
HOLDER FOLEY CATH W/STRAP (MISCELLANEOUS) ×1 IMPLANT
HOOD PEEL AWAY T7 (MISCELLANEOUS) IMPLANT
IV NS IRRIG 3000ML ARTHROMATIC (IV SOLUTION) ×1 IMPLANT
KIT TURNOVER KIT A (KITS) ×1 IMPLANT
KNIFE SCULPS 14X20 (INSTRUMENTS) ×1 IMPLANT
MANIFOLD NEPTUNE II (INSTRUMENTS) ×2 IMPLANT
NDL SPNL 20GX3.5 QUINCKE YW (NEEDLE) ×2 IMPLANT
NEEDLE SPNL 20GX3.5 QUINCKE YW (NEEDLE) ×2 IMPLANT
NS IRRIG 500ML POUR BTL (IV SOLUTION) ×1 IMPLANT
PACK TOTAL KNEE (MISCELLANEOUS) ×1 IMPLANT
PAD ABD DERMACEA PRESS 5X9 (GAUZE/BANDAGES/DRESSINGS) ×2 IMPLANT
PAD WRAPON POLAR KNEE (MISCELLANEOUS) ×1 IMPLANT
PATELLA MEDIAL ATTUN 35MM KNEE (Knees) IMPLANT
PIN DRILL FIX HALF THREAD (BIT) ×2 IMPLANT
PIN FIXATION 1/8DIA X 3INL (PIN) ×1 IMPLANT
PULSAVAC PLUS IRRIG FAN TIP (DISPOSABLE) ×1
SOL PREP PVP 2OZ (MISCELLANEOUS) ×1
SOLUTION IRRIG SURGIPHOR (IV SOLUTION) ×1 IMPLANT
SOLUTION PREP PVP 2OZ (MISCELLANEOUS) ×1 IMPLANT
SPONGE DRAIN TRACH 4X4 STRL 2S (GAUZE/BANDAGES/DRESSINGS) ×1 IMPLANT
STAPLER SKIN PROX 35W (STAPLE) ×1 IMPLANT
STOCKINETTE IMPERV 14X48 (MISCELLANEOUS) ×1 IMPLANT
STRAP TIBIA SHORT (MISCELLANEOUS) ×1 IMPLANT
SUCTION FRAZIER HANDLE 10FR (MISCELLANEOUS) ×1
SUCTION TUBE FRAZIER 10FR DISP (MISCELLANEOUS) ×1 IMPLANT
SUT VIC AB 0 CT1 36 (SUTURE) ×1 IMPLANT
SUT VIC AB 1 CT1 36 (SUTURE) ×2 IMPLANT
SUT VIC AB 2-0 CT2 27 (SUTURE) ×1 IMPLANT
SYR 30ML LL (SYRINGE) ×2 IMPLANT
TIP FAN IRRIG PULSAVAC PLUS (DISPOSABLE) ×1 IMPLANT
TOWEL OR 17X26 4PK STRL BLUE (TOWEL DISPOSABLE) IMPLANT
TOWER CARTRIDGE SMART MIX (DISPOSABLE) ×1 IMPLANT
TRAP FLUID SMOKE EVACUATOR (MISCELLANEOUS) ×1 IMPLANT
TRAY FOLEY MTR SLVR 16FR STAT (SET/KITS/TRAYS/PACK) ×1 IMPLANT
WATER STERILE IRR 1000ML POUR (IV SOLUTION) ×1 IMPLANT
WRAPON POLAR PAD KNEE (MISCELLANEOUS) ×1

## 2022-03-24 NOTE — H&P (Signed)
ORTHOPAEDIC HISTORY & PHYSICAL Regino Bellow, PA - 03/20/2022 11:00 AM EDT Formatting of this note is different from the original. Images from the original note were not included. Chief Complaint Chief Complaint Patient presents with Knee Pain H & P LEFT KNEE  Reason for Visit [name] is a 89 y.o. who presents today for a history and physical. She is to undergo a left total knee arthroplasty on 03/24/2022. Since her last visit there have been no change in her condition. Patient expresses her desire to proceed with total knee arthroplasty.  She is followed by Rheumatology for rheumatoid arthritis. She has a long history of left knee pain that has progressively worsened over the last 9 months She localizes most of the pain along the medial aspect of the knee. She reports significant swelling, no locking, and some giving way of the knee. The pain is aggravated by any weight bearing. The knee pain limits the patient's ability to ambulate long distances. The patient has not appreciated any significant improvement despite NSAIDs, tumeric, tramadol, methotrexate, intraarticular corticosteroid injections, viscosupplementation, and activity modification. She is not using any ambulatory aids. The patient states that the knee pain has progressed to the point that it is significantly interfering with her activities of daily living.  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 Pathology: two foci of papillary thyroid cancer. Surgeon: Dr Fredirick Maudlin. HYSTERECTOMY PARTIAL THYROIDECTOMY Left  Past Family History Family History Problem Relation Age of Onset Coronary Artery Disease (Blocked arteries around heart) Mother Myocardial Infarction (Heart attack) Mother High blood pressure (Hypertension) Mother No Known Problems Father  Medications Current Outpatient Medications Ordered in Epic Medication Sig Dispense Refill APPLE CIDER VINEGAR ORAL Take 1 tablet by mouth once daily ascorbic acid (VITAMIN C) 500 MG tablet Take 1,000 mg by mouth once daily aspirin-acetaminophen-caffeine (EXCEDRIN MIGRAINE) 250-250-65 mg per tablet Take 1 tablet by mouth every 6 (six) hours as needed for Pain calcium carbonate-vitamin D3 (CALTRATE 600+D) 600 mg(1,500mg ) -200 unit tablet Take 1 tablet by mouth 2 (two) times daily with meals. cyanocobalamin (VITAMIN B12) 1000 MCG tablet Take 1,000 mcg by mouth once daily. diclofenac (VOLTAREN) 1 % topical gel Apply 2 g topically as needed folic acid (FOLVITE) 1 MG tablet TAKE 2 TABLETS BY MOUTH EVERY DAY 180 tablet 1 hydroCHLOROthiazide (HYDRODIURIL) 25 MG tablet TAKE 1 TABLET BY MOUTH EVERY DAY 90 tablet 1 levothyroxine (SYNTHROID) 75 MCG tablet Take 1 tablet (75 mcg total) by mouth once daily Take on an empty stomach with a glass of water at least 30-60 minutes before breakfast. 90 tablet 3 methotrexate (RHEUMATREX) 2.5 MG tablet Take 3 tablets (7.5 mg total) by mouth every 7 (seven) days 36 tablet 1 multivitamin tablet Take 1 tablet by mouth once daily. pantoprazole (PROTONIX) 40 MG DR tablet Take 1 tablet (40 mg total) by mouth once daily 90 tablet 3 traMADoL (ULTRAM) 50 mg tablet Take 50 mg by mouth every 6 (six) hours as needed for Pain TURMERIC ORAL Take 1 tablet by mouth once daily  No current Epic-ordered facility-administered medications on file.  Allergies Allergies Allergen Reactions Indocin [Indomethacin Sodium] Palpitations and Dizziness Indomethacin Other (See Comments) and  Palpitations   Review of Systems A comprehensive 14  point ROS was performed, reviewed, and the pertinent orthopaedic findings are documented in the HPI.  Exam BP 120/82 (BP Location: Left upper arm, Patient Position: Sitting, BP Cuff Size: Adult)  Ht 154.9 cm (5\' 1" )  Wt 52.9 kg (116 lb 9.6 oz)  BMI 22.03 kg/m  General: Well-developed well-nourished female seen in no acute distress.  HEENT: Atraumatic,normocephalic. Pupils are equal and reactive to light. Oropharynx is clear with moist mucosa  Lungs: Clear to auscultation bilaterally  Cardiovascular: Regular rate and rhythm. Normal S1, S2. No murmurs. No appreciable gallops or rubs. Peripheral pulses are palpable.  Abdomen: Soft, non-tender, nondistended. Bowel sounds present  Extremity: Left Knee: Soft tissue swelling: mild Effusion: minimal Erythema: none Crepitance: mild Tenderness: medial Alignment: relative varus Mediolateral laxity: medial pseudolaxity Posterior sag: negative Patellar tracking: Good tracking without evidence of subluxation or tilt Atrophy: No significant atrophy. Quadriceps tone was fair to good. Range of motion: 0/0/125 degrees  Neurological:  The patient is alert and oriented Sensation to light touch appears to be intact and within normal limits Gross motor strength appeared to be equal to 5/5  Vascular :  Peripheral pulses felt to be palpable. Capillary refill appears to be intact and within normal limits  X-ray  1. X-rays taken on 02/09/2022 in [facility] shows significant narrowing of the medial cartilage space with bone-on-bone articulation being noted. She was noted to have increased varus deformity. Osteophyte and subchondral sclerosis was noted as well.  Impression  1. Degenerative arthrosis left knee  Plan  1. I did go over the patient's medication list on today's visit. 2. Discussed postop rehab 3. Past medical history was reviewed 4. Return to clinic 2 weeks  postop. Sooner for any problems  This note was generated in part with voice recognition software and I apologize for any typographical errors that were not detected and corrected   Watt Climes PA Electronically signed by Regino Bellow, PA at 03/20/2022 12:06 PM EDT

## 2022-03-24 NOTE — Op Note (Signed)
OPERATIVE NOTE  DATE OF SURGERY:  03/24/2022  PATIENT NAME:  BOBBY MURRAH   DOB: May 10, 1933  MRN: [id number]  PRE-OPERATIVE DIAGNOSIS: Degenerative arthrosis of the left knee, primary  POST-OPERATIVE DIAGNOSIS:  Same  PROCEDURE:  Left total knee arthroplasty using computer-assisted navigation  SURGEON:  Marciano Sequin. M.D.  ANESTHESIA: spinal  ESTIMATED BLOOD LOSS: 50 mL  FLUIDS REPLACED: 1000 mL of crystalloid  TOURNIQUET TIME: 70 minutes  DRAINS: 2 medium Hemovac drains  SOFT TISSUE RELEASES: Anterior cruciate ligament, posterior cruciate ligament, deep medial collateral ligament, patellofemoral ligament  IMPLANTS UTILIZED: DePuy Attune size 5 posterior stabilized femoral component (cemented), size 4 rotating platform tibial component (cemented), 35 mm medialized dome patella (cemented), and a 6 mm stabilized rotating platform polyethylene insert.  INDICATIONS FOR SURGERY: ADELEE BLANKE is a 89 y.o. year old female with a long history of progressive knee pain. X-rays demonstrated severe degenerative changes in tricompartmental fashion. The patient had not seen any significant improvement despite conservative nonsurgical intervention. After discussion of the risks and benefits of surgical intervention, the patient expressed understanding of the risks benefits and agree with plans for total knee arthroplasty.   The risks, benefits, and alternatives were discussed at length including but not limited to the risks of infection, bleeding, nerve injury, stiffness, blood clots, the need for revision surgery, cardiopulmonary complications, among others, and they were willing to proceed.  PROCEDURE IN DETAIL: The patient was brought into the operating room and, after adequate spinal anesthesia was achieved, a tourniquet was placed on the patient's upper thigh. The patient's knee and leg were cleaned and prepped with alcohol and DuraPrep and draped in the usual sterile fashion. A "timeout"  was performed as per usual protocol. The lower extremity was exsanguinated using an Esmarch, and the tourniquet was inflated to 300 mmHg. An anterior longitudinal incision was made followed by a standard mid vastus approach. The deep fibers of the medial collateral ligament were elevated in a subperiosteal fashion off of the medial flare of the tibia so as to maintain a continuous soft tissue sleeve. The patella was subluxed laterally and the patellofemoral ligament was incised. Inspection of the knee demonstrated severe degenerative changes with full-thickness loss of articular cartilage. Osteophytes were debrided using a rongeur. Anterior and posterior cruciate ligaments were excised. Two 4.0 mm Schanz pins were inserted in the femur and into the tibia for attachment of the array of trackers used for computer-assisted navigation. Hip center was identified using a circumduction technique. Distal landmarks were mapped using the computer. The distal femur and proximal tibia were mapped using the computer. The distal femoral cutting guide was positioned using computer-assisted navigation so as to achieve a 5 distal valgus cut. The femur was sized and it was felt that a size 5 femoral component was appropriate. A size 5 femoral cutting guide was positioned and the anterior cut was performed and verified using the computer. This was followed by completion of the posterior and chamfer cuts. Femoral cutting guide for the central box was then positioned in the center box cut was performed.  Attention was then directed to the proximal tibia. Medial and lateral menisci were excised. The extramedullary tibial cutting guide was positioned using computer-assisted navigation so as to achieve a 0 varus-valgus alignment and 3 posterior slope. The cut was performed and verified using the computer. The proximal tibia was sized and it was felt that a size 4 tibial tray was appropriate. Tibial and femoral trials were inserted  followed  by insertion of a 6 mm polyethylene insert. This allowed for excellent mediolateral soft tissue balancing both in flexion and in full extension. Finally, the patella was cut and prepared so as to accommodate a 35 mm medialized dome patella. A patella trial was placed and the knee was placed through a range of motion with excellent patellar tracking appreciated. The femoral trial was removed after debridement of posterior osteophytes. The central post-hole for the tibial component was reamed followed by insertion of a keel punch. Tibial trials were then removed. Cut surfaces of bone were irrigated with copious amounts of normal saline using pulsatile lavage and then suctioned dry. Polymethylmethacrylate cement with gentamicin was prepared in the usual fashion using a vacuum mixer. Cement was applied to the cut surface of the proximal tibia as well as along the undersurface of a size 4 rotating platform tibial component. Tibial component was positioned and impacted into place. Excess cement was removed using [employer] fast streamer. Cement was then applied to the cut surfaces of the femur as well as along the posterior flanges of the size 5 femoral component. The femoral component was positioned and impacted into place. Excess cement was removed using [employer] fast streamer. A 6 mm polyethylene trial was inserted and the knee was brought into full extension with steady axial compression applied. Finally, cement was applied to the backside of a 35 mm medialized dome patella and the patellar component was positioned and patellar clamp applied. Excess cement was removed using [employer] fast streamer. After adequate curing of the cement, the tourniquet was deflated after a total tourniquet time of 70 minutes. Hemostasis was achieved using electrocautery. The knee was irrigated with copious amounts of normal saline using pulsatile lavage followed by 450 ml of Surgiphor and then suctioned dry. 20 mL of 1.3% Exparel and 60 mL of 0.25%  Marcaine in 40 mL of normal saline was injected along the posterior capsule, medial and lateral gutters, and along the arthrotomy site. A 6 mm stabilized rotating platform polyethylene insert was inserted and the knee was placed through a range of motion with excellent mediolateral soft tissue balancing appreciated and excellent patellar tracking noted. 2 medium drains were placed in the wound bed and brought out through separate stab incisions. The medial parapatellar portion of the incision was reapproximated using interrupted sutures of #1 Vicryl. Subcutaneous tissue was approximated in layers using first #0 Vicryl followed #2-0 Vicryl. The skin was approximated with skin staples. A sterile dressing was applied.  The patient tolerated the procedure well and was transported to the recovery room in stable condition.    Harjot Zavadil P. Holley Bouche., M.D.

## 2022-03-24 NOTE — Progress Notes (Signed)
Patient is not able to walk the distance required to go the bathroom, or he/she is unable to safely negotiate stairs required to access the bathroom.  A 3in1 BSC will alleviate this problem   [name], Jr. M.D.  

## 2022-03-24 NOTE — Transfer of Care (Signed)
Immediate Anesthesia Transfer of Care Note  Patient: [name]  Procedure(s) Performed: COMPUTER ASSISTED TOTAL KNEE ARTHROPLASTY (Left: Knee)  Patient Location: PACU  Anesthesia Type:Spinal  Level of Consciousness: awake, alert , and oriented  Airway & Oxygen Therapy: Patient Spontanous Breathing and Patient connected to face mask oxygen  Post-op Assessment: Report given to RN and Post -op Vital signs reviewed and stable  Post vital signs: Reviewed and stable  Last Vitals:  Vitals Value Taken Time  BP    Temp    Pulse    Resp    SpO2      Last Pain:  Vitals:   03/24/22 0626  TempSrc: Temporal  PainSc: 7       Patients Stated Pain Goal: 0 ([id number])  Complications: No notable events documented.

## 2022-03-24 NOTE — Anesthesia Preprocedure Evaluation (Addendum)
Anesthesia Evaluation  Patient identified by MRN, date of birth, ID band Patient awake    Reviewed: Allergy & Precautions, NPO status , Patient's Chart, lab work & pertinent test results  History of Anesthesia Complications Negative for: history of anesthetic complications  Airway Mallampati: II  TM Distance: >3 FB Neck ROM: Full    Dental  (+) Poor Dentition, Dental Advidsory Given   Pulmonary neg sleep apnea, neg COPD, former smoker   breath sounds clear to auscultation- rhonchi (-) wheezing      Cardiovascular hypertension, Pt. on medications (-) CAD, (-) Past MI, (-) Cardiac Stents and (-) CABG  Rhythm:Regular Rate:Normal - Systolic murmurs and - Diastolic murmurs    Neuro/Psych neg Seizures negative neurological ROS  negative psych ROS   GI/Hepatic negative GI ROS, Neg liver ROS,,,  Endo/Other  neg diabetesHypothyroidism  Thyroid nodule   Renal/GU negative Renal ROS     Musculoskeletal  (+) Arthritis ,    Abdominal  (+) - obese  Peds  Hematology negative hematology ROS (+)   Anesthesia Other Findings Past Medical History: No date: Arthritis No date: Asthma     Comment:  as a child No date: Thyroid nodule   Reproductive/Obstetrics                             Anesthesia Physical Anesthesia Plan  ASA: 2  Anesthesia Plan: Spinal   Post-op Pain Management:    Induction: Intravenous  PONV Risk Score and Plan: 3 and Ondansetron, Dexamethasone and Midazolam  Airway Management Planned: Natural Airway and Nasal Cannula  Additional Equipment:   Intra-op Plan:   Post-operative Plan: Extubation in OR  Informed Consent: I have reviewed the patients History and Physical, chart, labs and discussed the procedure including the risks, benefits and alternatives for the proposed anesthesia with the patient or authorized representative who has indicated his/her understanding and  acceptance.     Dental Advisory Given  Plan Discussed with: Anesthesiologist, CRNA and Surgeon  Anesthesia Plan Comments: (Patient reports no bleeding problems and no anticoagulant use.  Plan for spinal with backup GA  Patient consented for risks of anesthesia including but not limited to:  - adverse reactions to medications - damage to eyes, teeth, lips or other oral mucosa - nerve damage due to positioning  - risk of bleeding, infection and or nerve damage from spinal that could lead to paralysis - risk of headache or failed spinal - damage to teeth, lips or other oral mucosa - sore throat or hoarseness - damage to heart, brain, nerves, lungs, other parts of body or loss of life  Patient voiced understanding.)        Anesthesia Quick Evaluation

## 2022-03-24 NOTE — Interval H&P Note (Signed)
History and Physical Interval Note:  03/24/2022 6:02 AM  [name]  has presented today for surgery, with the diagnosis of PRIMARY OSTEOARTHRITIS OF LEFT KNEE..  The various methods of treatment have been discussed with the patient and family. After consideration of risks, benefits and other options for treatment, the patient has consented to  Procedure(s): COMPUTER ASSISTED TOTAL KNEE ARTHROPLASTY - RNFA (Left) as a surgical intervention.  The patient's history has been reviewed, patient examined, no change in status, stable for surgery.  I have reviewed the patient's chart and labs.  Questions were answered to the patient's satisfaction.     [place]

## 2022-03-24 NOTE — Anesthesia Procedure Notes (Signed)
Spinal  Patient location during procedure: OR Start time: 03/24/2022 7:20 AM End time: 03/24/2022 7:25 AM Reason for block: surgical anesthesia Staffing Performed: resident/CRNA  Resident/CRNA: Demetrius Charity, CRNA Performed by: Demetrius Charity, CRNA Authorized by: Dimas Millin, MD   Preanesthetic Checklist Completed: patient identified, IV checked, site marked, risks and benefits discussed, surgical consent, monitors and equipment checked, pre-op evaluation and timeout performed Spinal Block Patient position: sitting Prep: ChloraPrep Patient monitoring: heart rate, continuous pulse ox, blood pressure and cardiac monitor Approach: midline Location: L3-4 Injection technique: single-shot Needle Needle type: Whitacre and Introducer  Needle gauge: 25 G Needle length: 9 cm Assessment Events: CSF return Additional Notes Negative paresthesia. Negative blood return. Positive free-flowing CSF. Expiration date of kit checked and confirmed. Patient tolerated procedure well, without complications.

## 2022-03-24 NOTE — Evaluation (Signed)
Physical Therapy Evaluation Patient Details Name: [name] MRN: [id number] DOB: 1933-03-31 Today's Date: 03/24/2022  History of Present Illness  Pt is an 89  y.o. 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  PMH includes htn, thyroid CA s/p total thyroidectomy 11/28/2019.  Clinical Impression  Prior to surgery, pt was independent with functional mobility; lives alone in multi-level home with steps to navigate; pt's daughter plans to stay with pt to assist for next 2 weeks.  2/10 L knee pain at rest beginning of session and 5/10 end of session at rest (nurse notified).  Pt able to perform L LE SLR independently; L knee AROM flexion to 70 degrees.  Currently pt is SBA with bed mobility; CGA with transfers using RW; and CGA to ambulate 30 feet with RW use.  Pt would currently benefit from skilled PT to address noted impairments and functional limitations (see below for any additional details).  Upon hospital discharge, pt would benefit from ongoing therapy.    Recommendations for follow up therapy are one component of a multi-disciplinary discharge planning process, led by the attending physician.  Recommendations may be updated based on patient status, additional functional criteria and insurance authorization.  Follow Up Recommendations Home health PT      Assistance Recommended at Discharge Frequent or constant Supervision/Assistance  Patient can return home with the following  A little help with walking and/or transfers;A little help with bathing/dressing/bathroom;Assistance with cooking/housework;Assist for transportation;Help with stairs or ramp for entrance    Equipment Recommendations Rolling walker (2 wheels) (youth sized)  Recommendations for Other Services  OT consult    Functional Status Assessment Patient has had a recent decline in their functional status and demonstrates the ability to make significant improvements in function in a reasonable and predictable  amount of time.     Precautions / Restrictions Precautions Precautions: Knee;Fall Precaution Booklet Issued: Yes (comment) Restrictions Weight Bearing Restrictions: Yes LLE Weight Bearing: Weight bearing as tolerated      Mobility  Bed Mobility Overal bed mobility: Needs Assistance Bed Mobility: Supine to Sit, Sit to Supine     Supine to sit: Supervision, HOB elevated Sit to supine: Supervision, HOB elevated   General bed mobility comments: mild increased effort to perform on own; vc's for technique    Transfers Overall transfer level: Needs assistance Equipment used: Rolling walker (2 wheels) Transfers: Sit to/from Stand Sit to Stand: Min guard           General transfer comment: vc's for UE/LE placement and overall technique    Ambulation/Gait Ambulation/Gait assistance: Min guard Gait Distance (Feet): 30 Feet Assistive device: Rolling walker (2 wheels)   Gait velocity: decreased     General Gait Details: antalgic; step to progressing to partial step through gait pattern; vc's for walker use and overall technique  Stairs            Wheelchair Mobility    Modified Rankin (Stroke Patients Only)       Balance Overall balance assessment: Needs assistance Sitting-balance support: No upper extremity supported, Feet supported Sitting balance-Leahy Scale: Good Sitting balance - Comments: steady sitting reaching within BOS   Standing balance support: Bilateral upper extremity supported, During functional activity, Reliant on assistive device for balance Standing balance-Leahy Scale: Good Standing balance comment: steady ambulating with RW use                             Pertinent Vitals/Pain  Pain Assessment Pain Assessment: 0-10 Pain Score: 5  Pain Location: L knee Pain Descriptors / Indicators: Aching, Sore, Tender Pain Intervention(s): Limited activity within patient's tolerance, Monitored during session, Premedicated before  session, Repositioned, Other (comment) (RN notified; polar care placed) Vitals (HR and O2 on room air) stable and WFL throughout treatment session.    Home Living Family/patient expects to be discharged to:: Private residence Living Arrangements: Alone Available Help at Discharge: Family;Available PRN/intermittently Type of Home: House Home Access: Stairs to enter   [employer] of Steps: 4-5 STE with R rail to central level and then 4-5 steps down to basement (main living area) with railing on R ascending   Home Layout: Multi-level (Kitchen on central level) Home Equipment: Cane - single point;Toilet riser (Lift chair) Additional Comments: Pt's daughter lives about 1 mile away but plans to stay with pt for next 2 weeks (works short time on Wednesdays and Sundays but otherwise available to assist)    Prior Function Prior Level of Function : Independent/Modified Independent             Mobility Comments: No recent falls.       Hand Dominance        Extremity/Trunk Assessment   Upper Extremity Assessment Upper Extremity Assessment: Overall WFL for tasks assessed    Lower Extremity Assessment Lower Extremity Assessment: LLE deficits/detail (R LE WFL) LLE Deficits / Details: able to perform L LE SLR independently; at least 3/5 AROM hip flexion and ankle DF/PF LLE: Unable to fully assess due to pain    Cervical / Trunk Assessment Cervical / Trunk Assessment: Normal  Communication   Communication: No difficulties  Cognition Arousal/Alertness: Awake/alert Behavior During Therapy: WFL for tasks assessed/performed Overall Cognitive Status: Within Functional Limits for tasks assessed                                          General Comments General comments (skin integrity, edema, etc.): L LE hemovac and dressings in place.  Nursing cleared pt for participation in physical therapy.  Pt agreeable to PT session.    Exercises Total Joint  Exercises Ankle Circles/Pumps: AROM, Strengthening, Both, 10 reps, Supine Quad Sets: AROM, Strengthening, Both, 10 reps, Supine Heel Slides: AAROM, Strengthening, Left, 10 reps, Supine Hip ABduction/ADduction: AROM, Strengthening, Left, 10 reps, Supine Straight Leg Raises: AROM, Strengthening, Left, 10 reps, Supine Goniometric ROM: L knee AROM 0 to 70 degrees   Assessment/Plan    PT Assessment Patient needs continued PT services  PT Problem List Decreased strength;Decreased range of motion;Decreased activity tolerance;Decreased balance;Decreased mobility;Decreased knowledge of use of DME;Decreased knowledge of precautions;Decreased skin integrity;Pain       PT Treatment Interventions DME instruction;Gait training;Stair training;Functional mobility training;Therapeutic activities;Therapeutic exercise;Balance training;Patient/family education    PT Goals (Current goals can be found in the Care Plan section)  Acute Rehab PT Goals Patient Stated Goal: to improve mobility PT Goal Formulation: With patient Time For Goal Achievement: 04/07/22 Potential to Achieve Goals: Good    Frequency BID     Co-evaluation               AM-PAC PT "6 Clicks" Mobility  Outcome Measure Help needed turning from your back to your side while in a flat bed without using bedrails?: None Help needed moving from lying on your back to sitting on the side of a flat bed without using bedrails?: A  Little Help needed moving to and from a bed to a chair (including a wheelchair)?: A Little Help needed standing up from a chair using your arms (e.g., wheelchair or bedside chair)?: A Little Help needed to walk in hospital room?: A Little Help needed climbing 3-5 steps with a railing? : A Little 6 Click Score: 19    End of Session Equipment Utilized During Treatment: Gait belt Activity Tolerance: Patient tolerated treatment well Patient left: in bed;with call bell/phone within reach;with bed alarm set;with  SCD's reapplied;Other (comment) (B heels floating via towel rolls; polar care in place) Nurse Communication: Mobility status;Precautions;Weight bearing status;Other (comment) (pt's pain status) PT Visit Diagnosis: Other abnormalities of gait and mobility (R26.89);Muscle weakness (generalized) (M62.81);Pain Pain - Right/Left: Left Pain - part of body: Knee    Time: [id number] PT Time Calculation (min) (ACUTE ONLY): 38 min   Charges:   PT Evaluation $PT Eval Low Complexity: 1 Low PT Treatments $Gait Training: 8-22 mins $Therapeutic Exercise: 8-22 mins        Leitha Bleak, PT 03/24/22, 4:58 PM

## 2022-03-24 NOTE — Anesthesia Procedure Notes (Signed)
Date/Time: 03/24/2022 7:33 AM  Performed by: Demetrius Charity, CRNAPre-anesthesia Checklist: Patient identified, Emergency Drugs available, Suction available, Patient being monitored and Timeout performed Patient Re-evaluated:Patient Re-evaluated prior to induction Oxygen Delivery Method: Simple face mask Induction Type: IV induction Placement Confirmation: CO2 detector and positive ETCO2

## 2022-03-25 DIAGNOSIS — M1712 Unilateral primary osteoarthritis, left knee: Secondary | ICD-10-CM | POA: Diagnosis not present

## 2022-03-25 MED ORDER — OXYCODONE HCL 5 MG PO TABS: 5.0000 mg | ORAL_TABLET | ORAL | 0 refills | Status: DC | PRN

## 2022-03-25 MED ORDER — ONDANSETRON HCL 4 MG PO TABS: 4.0000 mg | ORAL_TABLET | Freq: Four times a day (QID) | ORAL | 0 refills | Status: DC | PRN

## 2022-03-25 MED ORDER — CELECOXIB 200 MG PO CAPS: 200.0000 mg | ORAL_CAPSULE | Freq: Two times a day (BID) | ORAL | 0 refills | Status: DC

## 2022-03-25 MED ORDER — ENOXAPARIN SODIUM 40 MG/0.4ML IJ SOSY: 40.0000 mg | PREFILLED_SYRINGE | INTRAMUSCULAR | 0 refills | Status: DC

## 2022-03-25 MED ORDER — TRAMADOL HCL 50 MG PO TABS: 50.0000 mg | ORAL_TABLET | ORAL | 0 refills | Status: DC | PRN

## 2022-03-25 NOTE — Evaluation (Signed)
Occupational Therapy Evaluation Patient Details Name: [name] MRN: [id number] DOB: 03-Feb-1933 Today's Date: 03/25/2022   History of Present Illness Pt is an 89  y.o. 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  PMH includes htn, thyroid CA s/p total thyroidectomy 11/28/2019.   Clinical Impression   Patient seen for OT evaluation, daughter present. Pt presenting with decreased independence in self care, balance, functional mobility/transfers, and endurance. PTA pt lived alone and was independent for ADLs/IADLs. Pt's daughter is planning to stay with her upon D/C. Pt currently functioning at supervision for bed mobility, supervision-CGA for functional mobility to the bathroom using a RW, and Min guard for toilet transfer. Pt/daughter were educated on polar care system, precautions for LLE WBAT, and no pillow under L knee. Education and demonstration was provided for LB dressing AE including reacher and sock aid with good carryover. Handout provided. Pt able to complete LB dressing without AE this date. Pt will benefit from acute OT to increase overall independence in the areas of ADLs and functional mobility in order to safely discharge to next venue of care. OT recommends ongoing therapy upon discharge to maximize safety and independence with ADLs, decrease fall risk, decrease caregiver burden, and promote return to PLOF.       Recommendations for follow up therapy are one component of a multi-disciplinary discharge planning process, led by the attending physician.  Recommendations may be updated based on patient status, additional functional criteria and insurance authorization.   Follow Up Recommendations  Home health OT     Assistance Recommended at Discharge Frequent or constant Supervision/Assistance  Patient can return home with the following A little help with walking and/or transfers;A little help with bathing/dressing/bathroom;Assistance with cooking/housework;Assist  for transportation;Help with stairs or ramp for entrance    Functional Status Assessment  Patient has had a recent decline in their functional status and demonstrates the ability to make significant improvements in function in a reasonable and predictable amount of time.  Equipment Recommendations  None recommended by OT    Recommendations for Other Services       Precautions / Restrictions Precautions Precautions: Knee;Fall Precaution Booklet Issued: Yes (comment) Restrictions Weight Bearing Restrictions: Yes LLE Weight Bearing: Weight bearing as tolerated      Mobility Bed Mobility Overal bed mobility: Needs Assistance Bed Mobility: Supine to Sit, Sit to Supine     Supine to sit: Supervision, HOB elevated Sit to supine: Supervision, HOB elevated        Transfers Overall transfer level: Needs assistance Equipment used: Rolling walker (2 wheels) Transfers: Sit to/from Stand Sit to Stand: Min guard           General transfer comment: VCs for hand placement      Balance Overall balance assessment: Needs assistance Sitting-balance support: No upper extremity supported, Feet supported Sitting balance-Leahy Scale: Good     Standing balance support: Bilateral upper extremity supported, During functional activity, Single extremity supported Standing balance-Leahy Scale: Good                             ADL either performed or assessed with clinical judgement   ADL Overall ADL's : Needs assistance/impaired                     Lower Body Dressing: Sit to/from stand;Min guard;Sitting/lateral leans;Set up Lower Body Dressing Details (indicate cue type and reason): to don mesh underwear, able to complete without  AE Toilet Transfer: Min guard;BSC/3in1;Rolling walker (2 wheels);Ambulation Toilet Transfer Details (indicate cue type and reason): BSC frame placed over regular toilet 2/2 pt having toilet riser at home [place]  and Hygiene: Min guard;Sit to/from stand;Set up       Functional mobility during ADLs: Min guard;Supervision/safety;Rolling walker (2 wheels) (~32ft to the bathroom)       Vision Patient Visual Report: No change from baseline       Perception     Praxis      Pertinent Vitals/Pain Pain Assessment Pain Assessment: 0-10 Pain Score: 4  Pain Location: L knee Pain Descriptors / Indicators: Aching Pain Intervention(s): Monitored during session, Ice applied, Premedicated before session     Hand Dominance     Extremity/Trunk Assessment Upper Extremity Assessment Upper Extremity Assessment: Overall WFL for tasks assessed   Lower Extremity Assessment Lower Extremity Assessment: LLE deficits/detail LLE Deficits / Details: s/p TKR   Cervical / Trunk Assessment Cervical / Trunk Assessment: Normal   Communication Communication Communication: No difficulties   Cognition Arousal/Alertness: Awake/alert Behavior During Therapy: WFL for tasks assessed/performed Overall Cognitive Status: Within Functional Limits for tasks assessed           General Comments   (L knee dressing intact)    Exercises Other Exercises Other Exercises: OT provided education re: role of OT, OT POC, post acute recs, sitting up for all meals, EOB/OOB mobility with assistance, home/fall safety, LLE WBAT, no pillow under L knee, LB dressing AE (reacher, sock aid), adaptive strategies for ADLs (completing dressing/bathing in sitting)    Shoulder Instructions      Home Living Family/patient expects to be discharged to:: Private residence Living Arrangements: Alone Available Help at Discharge: Family;Available PRN/intermittently Type of Home: House Home Access: Stairs to enter [employer] of Steps: 4-5 STE with R rail to central level and then 4-5 steps down to basement (main living area) with railing on R ascending   Home Layout: Multi-level (Kitchen on central level)     Bathroom  Shower/Tub: Tub only   Biochemist, clinical: Standard     Home Equipment: Cane - single point;Toilet riser;Other (comment) (Lift chair)   Additional Comments: Pt's daughter lives about 1 mile away but plans to stay with pt for next 2 weeks (works part time on Wednesdays and Sundays but otherwise available to assist)      Prior Functioning/Environment Prior Level of Function : Independent/Modified Independent;Driving             Mobility Comments: No recent falls. ADLs Comments: Independent        OT Problem List: Decreased strength;Decreased range of motion;Decreased activity tolerance;Impaired balance (sitting and/or standing);Decreased knowledge of use of DME or AE;Decreased knowledge of precautions;Pain      OT Treatment/Interventions: Self-care/ADL training;Therapeutic exercise;Neuromuscular education;Energy conservation;DME and/or AE instruction;Manual therapy;Modalities;Balance training;Patient/family education;Cognitive remediation/compensation;Therapeutic activities;Splinting    OT Goals(Current goals can be found in the care plan section) Acute Rehab OT Goals Patient Stated Goal: return home OT Goal Formulation: With patient/family Time For Goal Achievement: 04/08/22 Potential to Achieve Goals: Good   OT Frequency: Min 2X/week    Co-evaluation              AM-PAC OT "6 Clicks" Daily Activity     Outcome Measure Help from another person eating meals?: None Help from another person taking care of personal grooming?: A Little Help from another person toileting, which includes using toliet, bedpan, or urinal?: A Little Help from another person bathing (including washing,  rinsing, drying)?: A Little Help from another person to put on and taking off regular upper body clothing?: None Help from another person to put on and taking off regular lower body clothing?: A Little 6 Click Score: 20   End of Session Equipment Utilized During Treatment: Rolling walker (2  wheels) Nurse Communication: Mobility status;Precautions;Weight bearing status  Activity Tolerance: Patient tolerated treatment well Patient left: in bed;with call bell/phone within reach;with bed alarm set;with family/visitor present  OT Visit Diagnosis: Pain;Other abnormalities of gait and mobility (R26.89);Muscle weakness (generalized) (M62.81) Pain - Right/Left: Left Pain - part of body: Knee                Time: [id number] OT Time Calculation (min): 18 min Charges:  OT General Charges $OT Visit: 1 Visit OT Evaluation $OT Eval Low Complexity: 1 Low  Windle Huebert MS, OTR/L ascom [phone]  03/25/22, 1:32 PM

## 2022-03-25 NOTE — Plan of Care (Signed)

## 2022-03-25 NOTE — TOC Progression Note (Addendum)
Transition of Care [facility]) - Progression Note    Patient Details  Name: [name] MRN: [id number] Date of Birth: [birth year]  [facility] of [facility]) CM/SW Contact  Izola Price, RN Phone Number: 03/25/2022, 12:34 PM  Clinical Narrative: 3/16: Patient has DME RW ordered by provider on 3/15 but was not yet called in to [employer]. Patient ready for discharge. [employer] called and will deliver to room within in the hour as home delivery likely not safe and would not be till Tuesday. Updated PTA that inquired about RW, and will provider one to patient as family present and ready to take patient home.  Canceled [employer] order. Simmie Davies RN CM            Expected Discharge Plan and Services         Expected Discharge Date: 03/25/22                                     Social Determinants of Health (SDOH) Interventions SDOH Screenings   Tobacco Use: Medium Risk (03/24/2022)    Readmission Risk Interventions     No data to display

## 2022-03-25 NOTE — Progress Notes (Signed)
  Subjective: 1 Day Post-Op Procedure(s) (LRB): COMPUTER ASSISTED TOTAL KNEE ARTHROPLASTY (Left) Patient reports pain as mild.   Patient is well, and has had no acute complaints or problems Daughter at bedside with patient. Plan is to go Home after hospital stay. Negative for chest pain and shortness of breath Fever: no Gastrointestinal:Negative for nausea and vomiting  Objective: Vital signs in last 24 hours: Temp:  [97.6 F (36.4 C)-98.7 F (37.1 C)] 98 F (36.7 C) (03/16 0713) Pulse Rate:  [71-91] 82 (03/16 0713) Resp:  [10-18] 18 (03/16 0713) BP: (93-135)/(46-88) 121/77 (03/16 0713) SpO2:  [96 %-100 %] 99 % (03/16 0713)  Intake/Output from previous day:  Intake/Output Summary (Last 24 hours) at 03/25/2022 0955 Last data filed at 03/25/2022 0253 Gross per 24 hour  Intake --  Output 983 ml  Net -983 ml    Intake/Output this shift: No intake/output data recorded.  Labs: No results for input(s): "HGB" in the last 72 hours. No results for input(s): "WBC", "RBC", "HCT", "PLT" in the last 72 hours. No results for input(s): "NA", "K", "CL", "CO2", "BUN", "CREATININE", "GLUCOSE", "CALCIUM" in the last 72 hours. No results for input(s): "LABPT", "INR" in the last 72 hours.   EXAM General - Patient is Alert, Appropriate, and Oriented Extremity - ABD soft Neurovascular intact Dorsiflexion/Plantar flexion intact Incision: dressing C/D/I No cellulitis present Compartment soft Dressing/Incision - Bulky dressing removed today.  Hemovac with mild bloody drainage, 130cc reported overall.  Hemovac removed without issue. [place]comb intact to the left knee without bloody drainage. Motor Function - intact, moving foot and toes well on exam.  Abdomen soft with intact bowel sounds.  Past Medical History:  Diagnosis Date   Arthritis    Asthma    as a child   Cancer ([place])    GERD (gastroesophageal reflux disease)    Hypothyroidism    Pneumonia 1971   Thyroid nodule      Assessment/Plan: 1 Day Post-Op Procedure(s) (LRB): COMPUTER ASSISTED TOTAL KNEE ARTHROPLASTY (Left) Principal Problem:   Total knee replacement status  Estimated body mass index is 21.58 kg/m as calculated from the following:   Height as of this encounter: 5\' 2"  (1.575 m).   Weight as of this encounter: 53.5 kg. Advance diet Up with therapy D/C IV fluids when tolerating po intake.  Vitals reviewed this AM, patient has had a BM since surgery. Bulky dressing and hemovac removed without issue. Up with therapy today. Plan for possible d/c home pending progress with PT.  DVT Prophylaxis - Lovenox and TED hose Weight-Bearing as tolerated to left leg  J. Cameron Proud, PA-C [facility] 03/25/2022, 9:55 AM

## 2022-03-25 NOTE — Anesthesia Postprocedure Evaluation (Signed)
Anesthesia Post Note  Patient: [name]  Procedure(s) Performed: COMPUTER ASSISTED TOTAL KNEE ARTHROPLASTY (Left: Knee)  Patient location during evaluation: Nursing Unit Anesthesia Type: Spinal Level of consciousness: oriented and awake and alert Pain management: pain level controlled Vital Signs Assessment: post-procedure vital signs reviewed and stable Respiratory status: spontaneous breathing, respiratory function stable and patient connected to nasal cannula oxygen Cardiovascular status: blood pressure returned to baseline and stable Postop Assessment: no headache, no backache and no apparent nausea or vomiting Anesthetic complications: no   No notable events documented.   Last Vitals:  Vitals:   03/25/22 0309 03/25/22 0713  BP: (!) 109/57 121/77  Pulse: 82 82  Resp: 15 18  Temp: 36.6 C 36.7 C  SpO2: 98% 99%    Last Pain:  Vitals:   03/25/22 0946  TempSrc:   PainSc: 3                  Arita Miss

## 2022-03-25 NOTE — Plan of Care (Signed)
Problem: Education: Goal: Knowledge of the prescribed therapeutic regimen will improve 03/25/2022 1057 by Alferd Apa, RN Outcome: Adequate for Discharge 03/25/2022 0755 by Alferd Apa, RN Outcome: Progressing Goal: Individualized Educational Video(s) 03/25/2022 1057 by Alferd Apa, RN Outcome: Adequate for Discharge 03/25/2022 0755 by Alferd Apa, RN Outcome: Progressing   Problem: Activity: Goal: Ability to avoid complications of mobility impairment will improve 03/25/2022 1057 by Alferd Apa, RN Outcome: Adequate for Discharge 03/25/2022 0755 by Alferd Apa, RN Outcome: Progressing Goal: Range of joint motion will improve 03/25/2022 1057 by Alferd Apa, RN Outcome: Adequate for Discharge 03/25/2022 0755 by Alferd Apa, RN Outcome: Progressing   Problem: Clinical Measurements: Goal: Postoperative complications will be avoided or minimized 03/25/2022 1057 by Alferd Apa, RN Outcome: Adequate for Discharge 03/25/2022 0755 by Alferd Apa, RN Outcome: Progressing   Problem: Pain Management: Goal: Pain level will decrease with appropriate interventions 03/25/2022 1057 by Alferd Apa, RN Outcome: Adequate for Discharge 03/25/2022 0755 by Alferd Apa, RN Outcome: Progressing   Problem: Skin Integrity: Goal: Will show signs of wound healing 03/25/2022 1057 by Alferd Apa, RN Outcome: Adequate for Discharge 03/25/2022 0755 by Alferd Apa, RN Outcome: Progressing   Problem: Education: Goal: Knowledge of General Education information will improve Description: Including pain rating scale, medication(s)/side effects and non-pharmacologic comfort measures 03/25/2022 1057 by Alferd Apa, RN Outcome: Adequate for Discharge 03/25/2022 0755 by Alferd Apa, RN Outcome: Progressing   Problem: Health Behavior/Discharge Planning: Goal: Ability to manage health-related needs will improve 03/25/2022 1057 by Alferd Apa, RN Outcome: Adequate for  Discharge 03/25/2022 0755 by Alferd Apa, RN Outcome: Progressing   Problem: Clinical Measurements: Goal: Ability to maintain clinical measurements within normal limits will improve 03/25/2022 1057 by Alferd Apa, RN Outcome: Adequate for Discharge 03/25/2022 0755 by Alferd Apa, RN Outcome: Progressing Goal: Will remain free from infection 03/25/2022 1057 by Alferd Apa, RN Outcome: Adequate for Discharge 03/25/2022 0755 by Alferd Apa, RN Outcome: Progressing Goal: Diagnostic test results will improve 03/25/2022 1057 by Alferd Apa, RN Outcome: Adequate for Discharge 03/25/2022 0755 by Alferd Apa, RN Outcome: Progressing Goal: Respiratory complications will improve 03/25/2022 1057 by Alferd Apa, RN Outcome: Adequate for Discharge 03/25/2022 0755 by Alferd Apa, RN Outcome: Progressing Goal: Cardiovascular complication will be avoided 03/25/2022 1057 by Alferd Apa, RN Outcome: Adequate for Discharge 03/25/2022 0755 by Alferd Apa, RN Outcome: Progressing   Problem: Activity: Goal: Risk for activity intolerance will decrease 03/25/2022 1057 by Alferd Apa, RN Outcome: Adequate for Discharge 03/25/2022 0755 by Alferd Apa, RN Outcome: Progressing   Problem: Nutrition: Goal: Adequate nutrition will be maintained 03/25/2022 1057 by Alferd Apa, RN Outcome: Adequate for Discharge 03/25/2022 0755 by Alferd Apa, RN Outcome: Progressing   Problem: Coping: Goal: Level of anxiety will decrease 03/25/2022 1057 by Alferd Apa, RN Outcome: Adequate for Discharge 03/25/2022 0755 by Alferd Apa, RN Outcome: Progressing   Problem: Elimination: Goal: Will not experience complications related to bowel motility 03/25/2022 1057 by Alferd Apa, RN Outcome: Adequate for Discharge 03/25/2022 0755 by Alferd Apa, RN Outcome: Progressing Goal: Will not experience complications related to urinary retention 03/25/2022 1057 by Alferd Apa, RN Outcome: Adequate  for Discharge 03/25/2022 0755 by Alferd Apa, RN Outcome: Progressing   Problem: Pain Managment: Goal: General experience of comfort will improve 03/25/2022 1057 by  Drayson Dorko, Alfredo Batty, RN Outcome: Adequate for Discharge 03/25/2022 0755 by Alferd Apa, RN Outcome: Progressing   Problem: Safety: Goal: Ability to remain free from injury will improve 03/25/2022 1057 by Alferd Apa, RN Outcome: Adequate for Discharge 03/25/2022 0755 by Alferd Apa, RN Outcome: Progressing   Problem: Skin Integrity: Goal: Risk for impaired skin integrity will decrease 03/25/2022 1057 by Alferd Apa, RN Outcome: Adequate for Discharge 03/25/2022 0755 by Alferd Apa, RN Outcome: Progressing

## 2022-03-25 NOTE — Progress Notes (Signed)
OT Cancellation Note  Patient Details Name: [name] MRN: [id number] DOB: Mar 10, 1933   Cancelled Treatment:    Reason Eval/Treat Not Completed: Pain limiting ability to participate;Other (comment). OT orders received, chart reviewed. Pt currently eating breakfast and requesting pain meds before participating in OT evaluation. Contacted RN and will try to time evaluation with pain meds later this date.   Doneta Public 03/25/2022, 8:41 AM

## 2022-03-25 NOTE — Discharge Summary (Signed)
Physician Discharge Summary  Patient ID: [name] MRN: [id number] DOB/AGE: 07/14/1933 27 y.o.  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   Consultants (if any):   Discharged Condition: Improved  Hospital Course: [name] is an 89 y.o. female who was admitted 03/24/2022 with a diagnosis of primary degenerative arthrosis of the left knee and went to the operating room on 03/24/2022 and underwent the above named procedures.    Surgeries: Procedure(s): COMPUTER ASSISTED TOTAL KNEE ARTHROPLASTY on 03/24/2022 Patient tolerated the surgery well. Taken to PACU where she was stabilized and then transferred to the orthopedic floor.  Started on Lovenox 30mg  q 12 hrs. Heels elevated on bed with rolled towels. No evidence of DVT. Negative Homan. Physical therapy started on day #1 for gait training and transfer. OT started day #1 for ADL and assisted devices.  Patient's IV and hemovac were removed on POD1.  Foley was removed shortly after surgery.  Implants: DePuy Attune size 5 posterior stabilized femoral component (cemented), size 4 rotating platform tibial component  (cemented), 35 mm medialized dome patella (cemented), and a 6 mm stabilized rotating platform polyethylene insert.   She was given perioperative antibiotics:  Anti-infectives (From admission, onward)    Start     Dose/Rate Route Frequency Ordered Stop   03/24/22 1330  ceFAZolin (ANCEF) IVPB 2g/100 mL premix        2 g 200 mL/hr over 30 Minutes Intravenous Every 6 hours 03/24/22 1039 03/24/22 2126   03/24/22 0650  ceFAZolin (ANCEF) 2-4 GM/100ML-% IVPB       Note to Pharmacy: Register, Santiago Glad A: cabinet override      03/24/22 0650 03/24/22 0730   03/24/22 0600  ceFAZolin (ANCEF) IVPB 2g/100 mL premix        2 g 200 mL/hr over 30 Minutes Intravenous On call to O.R. 03/23/22 2200 03/24/22 0745     .  She was given sequential compression devices, early ambulation, and Lovenox for DVT prophylaxis.  She benefited maximally from the hospital stay and there were no complications.    Recent vital signs:  Vitals:   03/25/22 0309 03/25/22 0713  BP: (!) 109/57 121/77  Pulse: 82 82  Resp: 15 18  Temp: 97.9 F (36.6 C) 98 F (36.7 C)  SpO2: 98% 99%    Recent laboratory studies:  No results found for: "HGB" No results found for: "WBC", "PLT" No results found for: "INR" Lab Results  Component Value Date   NA 138 03/14/2022   K 3.8 03/14/2022   CL 101 03/14/2022   CO2 29 03/14/2022   BUN 42 (H) 03/14/2022   CREATININE 1.16 (H) 03/14/2022   GLUCOSE  95 03/14/2022    Discharge Medications:   Allergies as of 03/25/2022       Reactions   Indomethacin Other (See Comments), Palpitations        Medication List     TAKE these medications    acetaminophen 500 MG tablet Commonly known as: TYLENOL Take 2 tablets (1,000 mg total) by mouth every 6 (six) hours.   APPLE CIDER VINEGAR PO Take 1 tablet by mouth daily. 1 gummie daily   ascorbic acid 1000 MG tablet Commonly known as: VITAMIN C Take 1,000 mg by mouth daily.   aspirin-acetaminophen-caffeine 250-250-65 MG  tablet Commonly known as: EXCEDRIN MIGRAINE Take 1 tablet by mouth every 6 (six) hours as needed for headache.   Calcium Carbonate-Vitamin D 600-200 MG-UNIT Caps Take 1,200 mg by mouth daily.   CALCIUM CITRATE PO Take 2 tablets by mouth daily. 600 mg   celecoxib 200 MG capsule Commonly known as: CELEBREX Take 1 capsule (200 mg total) by mouth 2 (two) times daily.   Cyanocobalamin 5000 MCG Tbdp Take 5,000 mcg by mouth daily.   enoxaparin 40 MG/0.4ML injection Commonly known as: LOVENOX Inject 0.4 mLs (40 mg total) into the skin daily.   folic acid 1 MG tablet Commonly known as: FOLVITE Take 1 mg by mouth daily.   hydrochlorothiazide 25 MG tablet Commonly known as: HYDRODIURIL Take 25 mg by mouth daily.   levothyroxine 75 MCG tablet Commonly known as: SYNTHROID Take 1 tablet (75 mcg total) by mouth daily at 6 (six) AM.   methotrexate 2.5 MG tablet Commonly known as: RHEUMATREX Take 15 mg by mouth every Thursday. 7.5 mg once a week   Multi-Vitamin tablet Take 1 tablet by mouth daily. Centrum   ondansetron 4 MG tablet Commonly known as: ZOFRAN Take 1 tablet (4 mg total) by mouth every 6 (six) hours as needed for nausea.   OVER THE COUNTER MEDICATION 1 Scoop daily. Multi collagen protein powder 1 scoop daily   oxyCODONE 5 MG immediate release tablet Commonly known as: Oxy IR/ROXICODONE Take 1 tablet (5 mg total) by mouth every 4 (four) hours as needed for severe pain.   pantoprazole 40 MG tablet Commonly known as: PROTONIX Take 40 mg by mouth daily.   traMADol 50 MG tablet Commonly known as: ULTRAM Take 1-2 tablets (50-100 mg total) by mouth every 4 (four) hours as needed for moderate pain. What changed:  how much to take when to take this reasons to take this   TURMERIC PO Take 1,000 mg by mouth daily.   Voltaren Arthritis Pain 1 % Gel Generic drug: diclofenac Sodium Apply 2 g topically as needed.               Durable Medical Equipment   (From admission, onward)           Start     Ordered   03/24/22 1439  DME Walker rolling  Once       Question:  Patient needs a walker to treat with the following condition  Answer:  Total knee replacement status   03/24/22 1438   03/24/22 1439  DME Bedside commode  Once       Comments: Patient is not able to walk the distance required to go the bathroom, or he/she is unable to safely negotiate stairs required to access the bathroom.  A 3in1 BSC will alleviate this problem  Question:  Patient needs a bedside commode to treat with the following condition  Answer:  Total knee replacement  status   03/24/22 1438            Diagnostic Studies: DG Knee Left Port  Result Date: 03/24/2022 CLINICAL DATA:  Status post total left knee arthroplasty. EXAM: PORTABLE LEFT KNEE - 1-2 VIEW COMPARISON:  None Available. FINDINGS: Status post recent total left knee arthroplasty. No perihardware lucency is seen to indicate hardware failure or loosening. There is a surgical drain tip overlying the suprapatellar joint. Moderate joint fluid. Old screw tracts within the distal femur and proximal tibia. Expected postoperative subcutaneous air. Anterior surgical skin staples. No acute fracture or dislocation. IMPRESSION: Status post recent total left knee arthroplasty without evidence of hardware failure. Electronically Signed   By: Yvonne Kendall M.D.   On: 03/24/2022 10:58    Disposition: Plan for possible d/c home today pending progress with PT.   Follow-up Information     Watt Climes, PA Follow up on 04/10/2022.   Specialty: Physician Assistant Why: at 1:15pm Contact information: [place] [id number] [phone]         Dereck Leep, MD Follow up on 05/04/2022.   Specialty: Orthopedic Surgery Why: at 2:45pm Contact information: [place] [address] [phone]                Signed: Judson Roch PA-C 03/25/2022,  10:01 AM

## 2022-03-25 NOTE — Progress Notes (Signed)
Physical Therapy Treatment Patient Details Name: [name] MRN: [id number] DOB: 12/22/1933 Today's Date: 03/25/2022   History of Present Illness Pt is an 89  y.o. 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  PMH includes htn, thyroid CA s/p total thyroidectomy 11/28/2019.    PT Comments    Great tolerance for PT session this am. Pt demonstrated ability to increase gait distance with RW 200+ft with Supervision, step through pattern, with increased heel strike on L. Good demonstration of safe negotiation on stairs with single Left rail and proper sequencing, daughter present. Pt and daughter educated on TKA packet, LE exercises, car transfer technique, and importance of mobilizing frequently at home. All needs met, appears functionally ready for d/c. Pt awaiting arrival of RW to room. She does not need a BSC.    Recommendations for follow up therapy are one component of a multi-disciplinary discharge planning process, led by the attending physician.  Recommendations may be updated based on patient status, additional functional criteria and insurance authorization.  Follow Up Recommendations  Home health PT     Assistance Recommended at Discharge Frequent or constant Supervision/Assistance  Patient can return home with the following A little help with walking and/or transfers;A little help with bathing/dressing/bathroom;Assistance with cooking/housework;Assist for transportation;Help with stairs or ramp for entrance   Equipment Recommendations  Rolling walker (2 wheels)    Recommendations for Other Services OT consult     Precautions / Restrictions Precautions Precautions: Knee;Fall Precaution Booklet Issued: Yes (comment) (Reviewed again this session) Required Braces or Orthoses:  (+SLR) Restrictions Weight Bearing Restrictions: Yes LLE Weight Bearing: Weight bearing as tolerated     Mobility  Bed Mobility Overal bed mobility: Needs Assistance Bed Mobility:  Supine to Sit     Supine to sit: Supervision, HOB elevated          Transfers Overall transfer level: Needs assistance Equipment used: Rolling walker (2 wheels) Transfers: Sit to/from Stand Sit to Stand: Min guard (vc's for hand placement)                Ambulation/Gait Ambulation/Gait assistance: Supervision, Min guard Gait Distance (Feet): 200 Feet Assistive device: Rolling walker (2 wheels) Gait Pattern/deviations: Step-through pattern, Decreased stance time - left       General Gait Details: antalgic; step to progressing to partial step through gait pattern; vc's for walker use and overall technique   Stairs             Wheelchair Mobility    Modified Rankin (Stroke Patients Only)       Balance Overall balance assessment: Needs assistance Sitting-balance support: No upper extremity supported, Feet supported Sitting balance-Leahy Scale: Good Sitting balance - Comments: steady sitting reaching within BOS   Standing balance support: Bilateral upper extremity supported, During functional activity, Reliant on assistive device for balance Standing balance-Leahy Scale: Good Standing balance comment: steady ambulating with RW use                            Cognition Arousal/Alertness: Awake/alert Behavior During Therapy: WFL for tasks assessed/performed Overall Cognitive Status: Within Functional Limits for tasks assessed                                          Exercises Total Joint Exercises Ankle Circles/Pumps: AROM, Strengthening, Both, 10 reps Quad Sets: AROM, Left,  5 reps Straight Leg Raises: AROM, Strengthening, Left, 10 reps, Supine Long Arc Quad: AROM, Left, 10 reps, Seated Knee Flexion: AROM, Left, 10 reps Goniometric ROM: 0-95 seated    General Comments General comments (skin integrity, edema, etc.):  (L knee dressing intact)      Pertinent Vitals/Pain Pain Assessment Pain Assessment: 0-10 Pain Score:  4  Pain Location: L knee Pain Descriptors / Indicators: Aching Pain Intervention(s): Patient requesting pain meds-RN notified, Ice applied    Home Living Family/patient expects to be discharged to:: Private residence Living Arrangements: Alone                      Prior Function            PT Goals (current goals can now be found in the care plan section) Acute Rehab PT Goals Patient Stated Goal: to improve mobility    Frequency    BID      PT Plan Current plan remains appropriate    Co-evaluation              AM-PAC PT "6 Clicks" Mobility   Outcome Measure  Help needed turning from your back to your side while in a flat bed without using bedrails?: None Help needed moving from lying on your back to sitting on the side of a flat bed without using bedrails?: A Little Help needed moving to and from a bed to a chair (including a wheelchair)?: A Little Help needed standing up from a chair using your arms (e.g., wheelchair or bedside chair)?: A Little Help needed to walk in hospital room?: A Little Help needed climbing 3-5 steps with a railing? : A Little 6 Click Score: 19    End of Session Equipment Utilized During Treatment: Gait belt Activity Tolerance: Patient tolerated treatment well Patient left: in chair;with call bell/phone within reach;with family/visitor present Nurse Communication: Mobility status;Precautions;Weight bearing status;Other (comment) PT Visit Diagnosis: Other abnormalities of gait and mobility (R26.89);Muscle weakness (generalized) (M62.81);Pain Pain - Right/Left: Left Pain - part of body: Knee     Time: 1030-1110 PT Time Calculation (min) (ACUTE ONLY): 40 min  Charges:  $Gait Training: 8-22 mins $Therapeutic Exercise: 8-22 mins $Therapeutic Activity: 8-22 mins                    Mikel Cella, PTA  Josie Dixon 03/25/2022, 11:20 AM

## 2022-03-27 ENCOUNTER — Encounter: Payer: Self-pay | Admitting: Orthopedic Surgery

## 2022-05-22 ENCOUNTER — Encounter: Payer: Medicare Other | Attending: Physician Assistant | Admitting: Physician Assistant

## 2022-05-22 DIAGNOSIS — M0580 Other rheumatoid arthritis with rheumatoid factor of unspecified site: Secondary | ICD-10-CM | POA: Insufficient documentation

## 2022-05-22 DIAGNOSIS — L89610 Pressure ulcer of right heel, unstageable: Secondary | ICD-10-CM | POA: Insufficient documentation

## 2022-05-22 DIAGNOSIS — M069 Rheumatoid arthritis, unspecified: Secondary | ICD-10-CM | POA: Diagnosis not present

## 2022-05-22 DIAGNOSIS — I10 Essential (primary) hypertension: Secondary | ICD-10-CM | POA: Diagnosis not present

## 2022-05-22 NOTE — Progress Notes (Signed)
[name], [name] ([id number]) [id number] Page 1 of 4 Visit Report for 05/22/2022 Abuse Risk Screen Details Patient Name: Date of Service: [name], Monica RMA J. 05/22/2022 2:00 PM Medical Record Number: [id number] Patient Account Number: [id number] Date of Birth/Sex: Treating RN: 06/28/1933 (89 y.o. [name] Primary Care Tate Zagal: Bethann Punches Other Clinician: Referring Makaiyah Schweiger: Treating Ringo Sherod/Extender: Allen Derry Barbette Reichmann Weeks in Treatment: 0 Abuse Risk Screen Items Answer Electronic Signature(s) Signed: 05/22/2022 3:24:25 PM By: Midge Aver MSN RN CNS WTA Entered By: Midge Aver on 05/22/2022 15:24:24 -------------------------------------------------------------------------------- Activities of Daily Living Details Patient Name: Date of Service: [name], Monica RMA J. 05/22/2022 2:00 PM Medical Record Number: [id number] Patient Account Number: [id number] Date of Birth/Sex: Treating RN: January 04, 1934 (89 y.o. [name] Primary Care Khalani Novoa: Bethann Punches Other Clinician: Referring Triniti Gruetzmacher: Treating Alisha Burgo/Extender: Lolita Cram Weeks in Treatment: 0 Activities of Daily Living Items Answer Activities of Daily Living (Please select one for each item) Drive Automobile Completely Able T Medications ake Completely Able Use T elephone Completely Able Care for Appearance Completely Able Use T oilet Completely Able Bath / Shower Completely Able Dress Self Completely Able Feed Self Completely Able Walk Completely Able Get In / Out Bed Completely Able Housework Completely Able Prepare Meals Completely Able Handle Money Completely Able Shop for Self Completely Able Electronic Signature(s) Signed: 05/22/2022 4:45:50 PM By: Midge Aver MSN RN CNS WTA Entered By: Midge Aver on 05/22/2022 14:08:40 -------------------------------------------------------------------------------- Education Screening Details Patient Name: Date of  Service: [name], Monica RMA J. 05/22/2022 2:00 PM Medical Record Number: [id number] Patient Account Number: [id number] Date of Birth/Sex: Treating RN: 1933/06/25 (89 y.o. [name] Primary Care Dieter Hane: Bethann Punches Other Clinician: Referring Caran Storck: Treating Rindy Kollman/Extender: Allen Derry Crawford Givens in Treatment: 0 Learning Preferences/Education Level/Primary Language Learning Preference: Explanation, Demonstration [name], [name] ([id number]) [id number] Page 2 of 4 Highest Education Level: High School Preferred Language: Economist Language Barrier: Monica Translator Needed: Monica Memory Deficit: Monica Emotional Barrier: Monica Cultural/Religious Beliefs Affecting Medical Care: Monica Physical Barrier Impaired Vision: Yes Glasses, reading only Impaired Hearing: Yes Decreased Hand dexterity: Monica Knowledge/Comprehension Knowledge Level: High Comprehension Level: High Ability to understand written instructions: High Ability to understand verbal instructions: High Motivation Anxiety Level: Calm Cooperation: Cooperative Education Importance: Acknowledges Need Interest in Health Problems: Asks Questions Perception: Coherent Willingness to Engage in Self-Management High Activities: Readiness to Engage in Self-Management High Activities: Electronic Signature(s) Signed: 05/22/2022 4:45:50 PM By: Midge Aver MSN RN CNS WTA Entered By: Midge Aver on 05/22/2022 14:09:58 -------------------------------------------------------------------------------- Fall Risk Assessment Details Patient Name: Date of Service: [name], Monica RMA J. 05/22/2022 2:00 PM Medical Record Number: [id number] Patient Account Number: [id number] Date of Birth/Sex: Treating RN: [birth year] (89 y.o. [name] Primary Care Kenn Rekowski: Bethann Punches Other Clinician: Referring Krishauna Schatzman: Treating Crystallee Werden/Extender: Lolita Cram Weeks in Treatment: 0 Fall Risk  Assessment Items Have you had 2 or more falls in the last 12 monthso 0 Monica Have you had any fall that resulted in injury in the last 12 monthso 0 Monica FALLS RISK SCREEN History of falling - immediate or within 3 months 0 Monica Secondary diagnosis (Do you have 2 or more medical diagnoseso) 0 Monica Ambulatory aid None/bed rest/wheelchair/nurse 0 Monica Crutches/cane/walker 0 Monica Furniture 0 Monica Intravenous therapy Access/Saline/Heparin Lock 0 Monica Gait/Transferring Normal/ bed rest/ wheelchair 0 Monica Weak (short steps with or without shuffle, stooped but able to lift  head while walking, may seek 0 Monica support from furniture) Impaired (short steps with shuffle, may have difficulty arising from chair, head down, impaired 0 Monica balance) Mental Status Oriented to own ability 0 Yes Electronic Signature(s) Signed: 05/22/2022 4:45:50 PM By: Midge Aver MSN RN CNS WTA Entered By: Midge Aver on 05/22/2022 14:10:24 [name] ([id number]) [phone] [id number] Page 3 of 4 -------------------------------------------------------------------------------- Foot Assessment Details Patient Name: Date of Service: [name], Monica RMA J. 05/22/2022 2:00 PM Medical Record Number: [id number] Patient Account Number: [id number] Date of Birth/Sex: Treating RN: [birth year] (89 y.o. [name] Primary Care Cru Kritikos: Bethann Punches Other Clinician: Referring Adella Manolis: Treating Jazia Faraci/Extender: Lolita Cram Weeks in Treatment: 0 Foot Assessment Items Site Locations + = Sensation present, - = Sensation absent, C = Callus, U = Ulcer R = Redness, W = Warmth, M = Maceration, PU = Pre-ulcerative lesion F = Fissure, S = Swelling, D = Dryness Assessment Right: Left: Other Deformity: Monica Monica Prior Foot Ulcer: Monica Monica Prior Amputation: Monica Monica Charcot Joint: Monica Monica Ambulatory Status: Ambulatory Without Help Gait: Electronic Signature(s) Signed: 05/22/2022 4:45:50 PM By: Midge Aver MSN RN CNS  WTA Entered By: Midge Aver on 05/22/2022 14:11:27 -------------------------------------------------------------------------------- Nutrition Risk Screening Details Patient Name: Date of Service: [name], Monica RMA J. 05/22/2022 2:00 PM Medical Record Number: [id number] Patient Account Number: [id number] Date of Birth/Sex: Treating RN: November 14, 1933 (89 y.o. [name] Primary Care Wylene Weissman: Bethann Punches Other Clinician: Referring Ataya Murdy: Treating Elmond Poehlman/Extender: Lolita Cram Weeks in Treatment: 0 Height (in): 62 Weight (lbs): 118 Body Mass Index (BMI): 21.6 Nutrition Risk Screening Items Score Screening [name], [name] ([id number]) [phone] [id number] Page 4 of 4 NUTRITION RISK SCREEN: I have an illness or condition that made me change the kind and/or amount of food I eat 0 Monica I eat fewer than two meals per day 0 Monica I eat few fruits and vegetables, or milk products 0 Monica I have three or more drinks of beer, liquor or wine almost every day 0 Monica I have tooth or mouth problems that make it hard for me to eat 0 Monica I don't always have enough money to buy the food I need 0 Monica I eat alone most of the time 0 Monica I take three or more different prescribed or over-the-counter drugs a day 1 Yes Without wanting to, I have lost or gained 10 pounds in the last six months 0 Monica I am not always physically able to shop, cook and/or feed myself 0 Monica Nutrition Protocols Good Risk Protocol 0 Monica interventions needed Moderate Risk Protocol High Risk Proctocol Risk Level: Good Risk Score: 1 Electronic Signature(s) Signed: 05/22/2022 4:45:50 PM By: Midge Aver MSN RN CNS WTA Entered By: Midge Aver on 05/22/2022 14:10:54

## 2022-05-25 ENCOUNTER — Ambulatory Visit (INDEPENDENT_AMBULATORY_CARE_PROVIDER_SITE_OTHER): Payer: Medicare Other

## 2022-05-25 ENCOUNTER — Other Ambulatory Visit (INDEPENDENT_AMBULATORY_CARE_PROVIDER_SITE_OTHER): Payer: Self-pay | Admitting: Physician Assistant

## 2022-05-25 DIAGNOSIS — L89610 Pressure ulcer of right heel, unstageable: Secondary | ICD-10-CM

## 2022-05-29 ENCOUNTER — Encounter: Payer: Medicare Other | Admitting: Physician Assistant

## 2022-05-29 DIAGNOSIS — L89610 Pressure ulcer of right heel, unstageable: Secondary | ICD-10-CM | POA: Diagnosis not present

## 2022-06-08 ENCOUNTER — Encounter: Payer: Medicare Other | Admitting: Physician Assistant

## 2022-06-08 DIAGNOSIS — L89610 Pressure ulcer of right heel, unstageable: Secondary | ICD-10-CM | POA: Diagnosis not present

## 2022-06-15 ENCOUNTER — Encounter: Payer: Medicare Other | Attending: Physician Assistant | Admitting: Physician Assistant

## 2022-06-15 DIAGNOSIS — I10 Essential (primary) hypertension: Secondary | ICD-10-CM | POA: Diagnosis not present

## 2022-06-15 DIAGNOSIS — L89610 Pressure ulcer of right heel, unstageable: Secondary | ICD-10-CM | POA: Diagnosis present

## 2022-06-15 DIAGNOSIS — M0580 Other rheumatoid arthritis with rheumatoid factor of unspecified site: Secondary | ICD-10-CM | POA: Diagnosis not present

## 2022-06-15 DIAGNOSIS — Z96652 Presence of left artificial knee joint: Secondary | ICD-10-CM | POA: Diagnosis not present

## 2022-06-20 ENCOUNTER — Encounter: Payer: Medicare Other | Admitting: Physician Assistant

## 2022-06-20 DIAGNOSIS — L89610 Pressure ulcer of right heel, unstageable: Secondary | ICD-10-CM | POA: Diagnosis not present

## 2022-06-20 NOTE — Progress Notes (Addendum)
[name], [name] ([id number]) [id number] Page 1 of 7 Visit Report for 06/20/2022 Chief Complaint Document Details Patient Name: Date of Service: [facility], [name] 06/20/2022 11:00 A M Medical Record Number: [id number] Patient Account Number: [id number] Date of Birth/Sex: Treating RN: [birth year] (89 y.o. Freddy Finner Primary Care Provider: Bethann Punches Other Clinician: Referring Provider: Treating Provider/Extender: [name] in Treatment: 4 Information Obtained from: Patient Chief Complaint Right heel pressure ulcer Electronic Signature(s) Signed: 06/20/2022 11:00:36 AM By: Allen Derry PA-C Entered By: Allen Derry on 06/20/2022 11:00:36 -------------------------------------------------------------------------------- Debridement Details Patient Name: Date of Service: [name], [name] 06/20/2022 11:00 A M Medical Record Number: [id number] Patient Account Number: [id number] Date of Birth/Sex: Treating RN: 1933/07/03 (89 y.o. Freddy Finner Primary Care Provider: Bethann Punches Other Clinician: Referring Provider: Treating Provider/Extender: [name] in Treatment: 4 Debridement Performed for Assessment: Wound #1 Right Calcaneus Performed By: Physician Allen Derry, PA-C Debridement Type: Chemical/Enzymatic/Mechanical Agent Used: Santyl Level of Consciousness (Pre-procedure): Awake and Alert Pre-procedure Verification/Time Out No Taken: Percent of Wound Bed Debrided: Instrument: Other : tongue depressor Bleeding: None End Time: 11:21 Procedural Pain: 0 Post Procedural Pain: 0 Response to Treatment: Procedure was tolerated well Level of Consciousness (Post- Awake and Alert procedure): Post Debridement Measurements of Total Wound [name], [name] ([id number]) [id number] Page 2 of 7 Length: (cm) 1.7 Stage: Unstageable/Unclassified Width: (cm) 1.9 Depth: (cm) 0.3 Volume: (cm)  0.761 Character of Wound/Ulcer Post Debridement: Improved Post Procedure Diagnosis Same as Pre-procedure Electronic Signature(s) Signed: 06/20/2022 6:26:43 PM By: Allen Derry PA-C Signed: 06/22/2022 4:13:25 PM By: Yevonne Pax RN Entered By: Yevonne Pax on 06/20/2022 11:21:43 -------------------------------------------------------------------------------- HPI Details Patient Name: Date of Service: [name], [name] 06/20/2022 11:00 A M Medical Record Number: [id number] Patient Account Number: [id number] Date of Birth/Sex: Treating RN: 04/04/1933 (89 y.o. Freddy Finner Primary Care Provider: Bethann Punches Other Clinician: Referring Provider: Treating Provider/Extender: [name] in Treatment: 4 History of Present Illness HPI Description: 05-22-2022 upon evaluation today patient appears to be having issues with the wound on her right heel. This actually occurred during a surgical intervention for her left knee she had a knee replacement. She went in with no issues and when she got home or even while she was staying overnight before being discharged home noted to have an issue with a discolored area on her right heel. She is unsure how this happened but knows it was not present prior to the surgery. She has been monitoring since it is now an eschar covered region that obviously is going to need to be removed in order to allow this to heal. With that being said her ABIs were not registering very well NuShield today it appears that she may have a issue with good arterial flow this could be somewhat accounting for what is going on with the heel. Nonetheless I do think she is probably going to be seen by a vein and vascular for arterial studies. She had the surgery on Friday and came home on a Saturday. This was a very short stay. Patient does have a history of rheumatoid arthritis but really no other major medical problems. 05-29-2022 upon evaluation today patient appears to be  doing well currently in regard to her wound. In fact she is telling me that she is doing quite well at this point. Fortunately there does not appear to be any signs of active infection locally nor systemically at this time  which is great news. No fevers, chills, nausea, vomiting, or diarrhea. The good news that she did get the Santyl although it took until Friday. 06-15-2022 upon evaluation today patient appears to be doing well currently in regard to her heel ulcer. We are slowly getting this to loosen up and clearway. Fortunately I do not see any signs of active infection at this time which is great news I think that we are definitely ready to clear away some of the necrotic debris today. 06-20-2022 upon evaluation today patient's wound is actually showing signs of good improvement with regard to the heel. She has been tolerating the dressing changes without complication and currently I am going to recommend that we go ahead and continue with the plan using the Santyl to help clean up the surface of this wound. Really she seems to be doing quite well with that. Electronic Signature(s) Signed: 06/23/2022 1:56:54 PM By: Allen Derry PA-C Entered By: Allen Derry on 06/23/2022 13:56:54 [name] ([id number]) [id number] Page 3 of 7 -------------------------------------------------------------------------------- Physical Exam Details Patient Name: Date of Service: [name], [name] 06/20/2022 11:00 A M Medical Record Number: [id number] Patient Account Number: [id number] Date of Birth/Sex: Treating RN: February 25, 1933 (89 y.o. Freddy Finner Primary Care Provider: Bethann Punches Other Clinician: Referring Provider: Treating Provider/Extender: [name] in Treatment: 4 Constitutional Well-nourished and well-hydrated in no acute distress. Respiratory normal breathing without difficulty. Psychiatric this patient is able to make decisions and demonstrates  good insight into disease process. Alert and Oriented x 3. pleasant and cooperative. Notes Upon inspection patient's wound bed actually showed signs of good granulation epithelization at this point. Fortunately I see no evidence of active infection locally nor systemically which is great news and in general I do think we are moving in the right direction here. Electronic Signature(s) Signed: 06/23/2022 1:57:10 PM By: Allen Derry PA-C Entered By: Allen Derry on 06/23/2022 13:57:10 -------------------------------------------------------------------------------- Physician Orders Details Patient Name: Date of Service: [name], [name] 06/20/2022 11:00 A M Medical Record Number: [id number] Patient Account Number: [id number] Date of Birth/Sex: Treating RN: [birth year] (89 y.o. Freddy Finner Primary Care Provider: Bethann Punches Other Clinician: Referring Provider: Treating Provider/Extender: [name] in Treatment: 4 Verbal / Phone Orders: No Diagnosis Coding ICD-10 Coding Code Description L89.610 Pressure ulcer of right heel, unstageable M05.80 Other rheumatoid arthritis with rheumatoid factor of unspecified site Follow-up Appointments Return Appointment in 1 week. Bathing/ [employer] wounds with antibacterial soap and water. - [address] dial GRAELYNN, SASSEEN ([id number]) [id number] Page 4 of 7 Anesthetic (Use 'Patient Medications' Section for Anesthetic Order Entry) Lidocaine applied to wound bed Wound Treatment Wound #1 - Calcaneus Wound Laterality: Right Cleanser: Byram Ancillary Kit - 15 Day Supply (DME) (Generic) 1 x Per Day/30 Days Discharge Instructions: Use supplies as instructed; Kit contains: (15) Saline Bullets; (15) 3x3 Gauze; 15 pr Gloves Cleanser: Soap and Water 1 x Per Day/30 Days Discharge Instructions: Gently cleanse wound with antibacterial soap, rinse and pat dry prior to dressing wounds Topical: Santyl Collagenase  Ointment, 30 (gm), tube 1 x Per Day/30 Days Discharge Instructions: apply nickel thick to wound bed only Prim Dressing: Gauze (Generic) 1 x Per Day/30 Days ary Discharge Instructions: moistened with saline Secondary Dressing: (BORDER) Zetuvit Plus SILICONE BORDER Dressing 4x4 (in/in) (DME) (Generic) 1 x Per Day/30 Days Discharge Instructions: Please do not put silicone bordered dressings under wraps. Use non-bordered dressing only. Electronic Signature(s) Signed: 06/20/2022 6:26:43 PM By:  Allen Derry PA-C Signed: 06/22/2022 4:13:25 PM By: Yevonne Pax RN Entered By: Yevonne Pax on 06/20/2022 11:29:32 -------------------------------------------------------------------------------- Problem List Details Patient Name: Date of Service: [name], [name] 06/20/2022 11:00 A M Medical Record Number: [id number] Patient Account Number: [id number] Date of Birth/Sex: Treating RN: 1933/01/26 (89 y.o. Freddy Finner Primary Care Provider: Bethann Punches Other Clinician: Referring Provider: Treating Provider/Extender: [name] in Treatment: 4 Active Problems ICD-10 Encounter Code Description Active Date MDM Diagnosis L89.610 Pressure ulcer of right heel, unstageable 05/22/2022 No Yes M05.80 Other rheumatoid arthritis with rheumatoid factor of unspecified site 05/22/2022 No Yes Inactive Problems Resolved Problems Electronic Signature(s) Signed: 06/20/2022 11:00:34 AM By: Allen Derry PA-C Entered By: Allen Derry on 06/20/2022 11:00:33 [name] ([id number]) [id number] Page 5 of 7 -------------------------------------------------------------------------------- Progress Note Details Patient Name: Date of Service: [name], [name] 06/20/2022 11:00 A M Medical Record Number: [id number] Patient Account Number: [id number] Date of Birth/Sex: Treating RN: 08-16-1933 (89 y.o. Freddy Finner Primary Care Provider: Bethann Punches Other Clinician: Referring  Provider: Treating Provider/Extender: [name] in Treatment: 4 Subjective Chief Complaint Information obtained from Patient Right heel pressure ulcer History of Present Illness (HPI) 05-22-2022 upon evaluation today patient appears to be having issues with the wound on her right heel. This actually occurred during a surgical intervention for her left knee she had a knee replacement. She went in with no issues and when she got home or even while she was staying overnight before being discharged home noted to have an issue with a discolored area on her right heel. She is unsure how this happened but knows it was not present prior to the surgery. She has been monitoring since it is now an eschar covered region that obviously is going to need to be removed in order to allow this to heal. With that being said her ABIs were not registering very well NuShield today it appears that she may have a issue with good arterial flow this could be somewhat accounting for what is going on with the heel. Nonetheless I do think she is probably going to be seen by a vein and vascular for arterial studies. She had the surgery on Friday and came home on a Saturday. This was a very short stay. Patient does have a history of rheumatoid arthritis but really no other major medical problems. 05-29-2022 upon evaluation today patient appears to be doing well currently in regard to her wound. In fact she is telling me that she is doing quite well at this point. Fortunately there does not appear to be any signs of active infection locally nor systemically at this time which is great news. No fevers, chills, nausea, vomiting, or diarrhea. The good news that she did get the Santyl although it took until Friday. 06-15-2022 upon evaluation today patient appears to be doing well currently in regard to her heel ulcer. We are slowly getting this to loosen up and clearway. Fortunately I do not see any signs of  active infection at this time which is great news I think that we are definitely ready to clear away some of the necrotic debris today. 06-20-2022 upon evaluation today patient's wound is actually showing signs of good improvement with regard to the heel. She has been tolerating the dressing changes without complication and currently I am going to recommend that we go ahead and continue with the plan using the Santyl to help clean up the surface of  this wound. Really she seems to be doing quite well with that. Objective Constitutional Well-nourished and well-hydrated in no acute distress. Vitals Time Taken: 11:05 AM, Height: 62 in, Weight: 118 lbs, BMI: 21.6, Temperature: 98.7 F, Pulse: 89 bpm, Respiratory Rate: 18 breaths/min, Blood Pressure: 122/70 mmHg. Respiratory normal breathing without difficulty. Psychiatric this patient is able to make decisions and demonstrates good insight into disease process. Alert and Oriented x 3. pleasant and cooperative. General Notes: Upon inspection patient's wound bed actually showed signs of good granulation epithelization at this point. Fortunately I see no evidence of active infection locally nor systemically which is great news and in general I do think we are moving in the right direction here. Integumentary (Hair, Skin) Wound #1 status is Open. Original cause of wound was Pressure Injury. The date acquired was: 03/25/2022. The wound has been in treatment 4 weeks. The wound is located on the Right Calcaneus. The wound measures 1.7cm length x 1.9cm width x 0.3cm depth; 2.537cm^2 area and 0.761cm^3 volume. There is no tunneling or undermining noted. There is a medium amount of serosanguineous drainage noted. There is no granulation within the wound bed. There is a large (67-100%) amount of necrotic tissue within the wound bed including Eschar and Adherent Slough. DENZEL, BACON ([id number]) [id number] Page 6 of  7 Assessment Active Problems ICD-10 Pressure ulcer of right heel, unstageable Other rheumatoid arthritis with rheumatoid factor of unspecified site Procedures Wound #1 Pre-procedure diagnosis of Wound #1 is a Pressure Ulcer located on the Right Calcaneus . There was a Chemical/Enzymatic/Mechanical debridement performed by Allen Derry, PA-C. With the following instrument(s): tongue depressor. Agent used was [employer]. There was no bleeding. The procedure was tolerated well with a pain level of 0 throughout and a pain level of 0 following the procedure. Post Debridement Measurements: 1.7cm length x 1.9cm width x 0.3cm depth; 0.761cm^3 volume. Post debridement Stage noted as Unstageable/Unclassified. Character of Wound/Ulcer Post Debridement is improved. Post procedure Diagnosis Wound #1: Same as Pre-Procedure Plan Follow-up Appointments: Return Appointment in 1 week. Bathing/ Shower/ Hygiene: Wash wounds with antibacterial soap and water. Doylene Canning dial Anesthetic (Use 'Patient Medications' Section for Anesthetic Order Entry): Lidocaine applied to wound bed WOUND #1: - Calcaneus Wound Laterality: Right Cleanser: Byram Ancillary Kit - 15 Day Supply (DME) (Generic) 1 x Per Day/30 Days Discharge Instructions: Use supplies as instructed; Kit contains: (15) Saline Bullets; (15) 3x3 Gauze; 15 pr Gloves Cleanser: Soap and Water 1 x Per Day/30 Days Discharge Instructions: Gently cleanse wound with antibacterial soap, rinse and pat dry prior to dressing wounds Topical: Santyl Collagenase Ointment, 30 (gm), tube 1 x Per Day/30 Days Discharge Instructions: apply nickel thick to wound bed only Prim Dressing: Gauze (Generic) 1 x Per Day/30 Days ary Discharge Instructions: moistened with saline Secondary Dressing: (BORDER) Zetuvit Plus SILICONE BORDER Dressing 4x4 (in/in) (DME) (Generic) 1 x Per Day/30 Days Discharge Instructions: Please do not put silicone bordered dressings under wraps. Use non-bordered  dressing only. 1. I am good recommend that she continue with the Santyl which I think is doing a good job. 2 also can recommend that we continue with the bordered foam dressing to cover. 3. I would also suggest that she continue to keep pressure off the heel obviously the better and more effective she is not doing this the faster this will heal. We will see patient back for reevaluation in 1 week here in the clinic. If anything worsens or changes patient will contact our office for additional recommendations.  Electronic Signature(s) Signed: 06/23/2022 1:57:36 PM By: Allen Derry PA-C Entered By: Allen Derry on 06/23/2022 13:57:36 -------------------------------------------------------------------------------- SuperBill Details Patient Name: Date of Service: [name], [name] 06/20/2022 Medical Record Number: [id number] Patient Account Number: [id number] Date of Birth/Sex: Treating RN: 1933/08/23 (89 y.o. Jalayah, [place], [place] Shela Commons ([id number]) [id number] Page 7 of 7 Primary Care Provider: Bethann Punches Other Clinician: Referring Provider: Treating Provider/Extender: [name] in Treatment: 4 Diagnosis Coding ICD-10 Codes Code Description L89.610 Pressure ulcer of right heel, unstageable M05.80 Other rheumatoid arthritis with rheumatoid factor of unspecified site Facility Procedures : CPT4 Code: [id number] Description: 30865 - DEBRIDE W/O ANES NON SELECT Modifier: Quantity: 1 Physician Procedures : CPT4 Code Description Modifier [id number] - WC PHYS LEVEL 3 - EST PT ICD-10 Diagnosis Description L89.610 Pressure ulcer of right heel, unstageable M05.80 Other rheumatoid arthritis with rheumatoid factor of unspecified site Quantity: 1 Electronic Signature(s) Signed: 06/20/2022 6:18:28 PM By: Allen Derry PA-C Entered By: Allen Derry on 06/20/2022 18:18:28

## 2022-06-20 NOTE — Progress Notes (Addendum)
[name], [name] ([id number]) [id number] Page 1 of 9 Visit Report for 06/20/2022 Arrival Information Details Patient Name: Date of Service: [facility], [name]. 06/20/2022 11:00 A M Medical Record Number: [id number] Patient Account Number: [id number] Date of Birth/Sex: Treating RN: [birth year] (89 y.o. Freddy Finner Primary Care Terressa Evola: Bethann Punches Other Clinician: Referring Tzirel Leonor: Treating Tyrone Pautsch/Extender: [name] in Treatment: 4 Visit Information History Since Last Visit Added or deleted any medications: Monica Patient Arrived: Ambulatory Any new allergies or adverse reactions: Monica Arrival Time: 11:01 Had a fall or experienced change in Monica Accompanied By: friend activities of daily living that may affect Transfer Assistance: None risk of falls: Patient Identification Verified: Yes Signs or symptoms of abuse/neglect since last visito Monica Secondary Verification Process Completed: Yes Hospitalized since last visit: Monica Patient Requires Transmission-Based Precautions: Monica Implantable device outside of the clinic excluding Monica Patient Has Alerts: Yes cellular tissue based products placed in the center Patient Alerts: NOT Diabetic since last visit: PAD Bilateral Has Dressing in Place as Prescribed: Yes ABI L1.08TBI .70 05/25/22 Pain Present Now: Monica ABI R .97 TBI .70 05/25/22 Electronic Signature(s) Signed: 06/22/2022 4:13:25 PM By: Yevonne Pax RN Entered By: Yevonne Pax on 06/20/2022 11:05:06 -------------------------------------------------------------------------------- Clinic Level of Care Assessment Details Patient Name: Date of Service: [facility], [name]. 06/20/2022 11:00 A M Medical Record Number: [id number] Patient Account Number: [id number] Date of Birth/Sex: Treating RN: 25-Feb-1933 (89 y.o. Freddy Finner Primary Care Saniah Schroeter: Bethann Punches Other Clinician: Referring Elianis Fischbach: Treating Jacquelyn Shadrick/Extender: [name] in Treatment: 4 Clinic Level of Care Assessment Items TOOL 1 Quantity Score []  - 0 Use when EandM and Procedure is performed on INITIAL visit ASSESSMENTS - Nursing Assessment / Reassessment []  - 0 General Physical Exam (combine w/ comprehensive assessment (listed just below) when performed on new pt. evals) []  - 0 Comprehensive Assessment (HX, ROS, Risk Assessments, Wounds Hx, etc.) [name], [name] ([id number]) [id number] Page 2 of 9 ASSESSMENTS - Wound and Skin Assessment / Reassessment []  - 0 Dermatologic / Skin Assessment (not related to wound area) ASSESSMENTS - Ostomy and/or Continence Assessment and Care []  - 0 Incontinence Assessment and Management []  - 0 Ostomy Care Assessment and Management (repouching, etc.) PROCESS - Coordination of Care []  - 0 Simple Patient / Family Education for ongoing care []  - 0 Complex (extensive) Patient / Family Education for ongoing care []  - 0 Staff obtains Chiropractor, Records, T Results / Process Orders est []  - 0 Staff telephones HHA, Nursing Homes / Clarify orders / etc []  - 0 Routine Transfer to another Facility (non-emergent condition) []  - 0 Routine Hospital Admission (non-emergent condition) []  - 0 New Admissions / Manufacturing engineer / Ordering NPWT Apligraf, etc. , []  - 0 Emergency Hospital Admission (emergent condition) PROCESS - Special Needs []  - 0 Pediatric / Minor Patient Management []  - 0 Isolation Patient Management []  - 0 Hearing / Language / Visual special needs []  - 0 Assessment of Community assistance (transportation, D/C planning, etc.) []  - 0 Additional assistance / Altered mentation []  - 0 Support Surface(s) Assessment (bed, cushion, seat, etc.) INTERVENTIONS - Miscellaneous []  - 0 External ear exam []  - 0 Patient Transfer (multiple staff / Nurse, adult / Similar devices) []  - 0 Simple Staple / Suture removal (25 or less) []  - 0 Complex Staple / Suture removal  (26 or more) []  - 0 Hypo/Hyperglycemic Management (do not check if billed separately) []  - 0 Ankle / Brachial Index (  ABI) - do not check if billed separately Has the patient been seen at the hospital within the last three years: Yes Total Score: 0 Level Of Care: ____ Electronic Signature(s) Signed: 06/22/2022 4:13:25 PM By: Yevonne Pax RN Entered By: Yevonne Pax on 06/20/2022 11:22:09 -------------------------------------------------------------------------------- Encounter Discharge Information Details Patient Name: Date of Service: [name], [name]. 06/20/2022 11:00 A M Medical Record Number: [id number] Patient Account Number: [id number] Date of Birth/Sex: Treating RN: [birth year] (89 y.o. Freddy Finner Primary Care Lafayette Dunlevy: Bethann Punches Other Clinician: Referring Maureen Duesing: Treating Corban Kistler/Extender: [name] in Treatment: 4 [name], [name] ([id number]) [id number] Page 3 of 9 Encounter Discharge Information Items Post Procedure Vitals Discharge Condition: Stable Temperature (F): 98.7 Ambulatory Status: Ambulatory Pulse (bpm): 89 Discharge Destination: Home Respiratory Rate (breaths/min): 18 Transportation: Private Auto Blood Pressure (mmHg): 122/70 Accompanied By: friend Schedule Follow-up Appointment: Yes Clinical Summary of Care: Electronic Signature(s) Signed: 06/22/2022 4:13:25 PM By: Yevonne Pax RN Entered By: Yevonne Pax on 06/20/2022 11:23:11 -------------------------------------------------------------------------------- Lower Extremity Assessment Details Patient Name: Date of Service: [facility], [name]. 06/20/2022 11:00 A M Medical Record Number: [id number] Patient Account Number: [id number] Date of Birth/Sex: Treating RN: March 24, 1933 (89 y.o. Freddy Finner Primary Care Panayiotis Rainville: Bethann Punches Other Clinician: Referring Talena Neira: Treating Dontea Corlew/Extender: [name] in Treatment: 4 Edema  Assessment Assessed: [Left: Monica] [Right: Monica] Edema: [Left: Ye] [Right: s] Calf Left: Right: Point of Measurement: 32 cm From Medial Instep 32 cm Ankle Left: Right: Point of Measurement: 10 cm From Medial Instep 21 cm Vascular Assessment Pulses: Dorsalis Pedis Palpable: [Right:Yes] Electronic Signature(s) Signed: 06/22/2022 4:13:25 PM By: Yevonne Pax RN Entered By: Yevonne Pax on 06/20/2022 11:11:21 [name] ([id number]) [id number] Page 4 of 9 -------------------------------------------------------------------------------- Multi Wound Chart Details Patient Name: Date of Service: [name], [name]. 06/20/2022 11:00 A M Medical Record Number: [id number] Patient Account Number: [id number] Date of Birth/Sex: Treating RN: [birth year] (88 y.o. Freddy Finner Primary Care Gus Littler: Bethann Punches Other Clinician: Referring Helaman Mecca: Treating Travaughn Vue/Extender: [name] in Treatment: 4 Vital Signs Height(in): 62 Pulse(bpm): 89 Weight(lbs): 118 Blood Pressure(mmHg): 122/70 Body Mass Index(BMI): 21.6 Temperature(F): 98.7 Respiratory Rate(breaths/min): 18 [1:Photos:] [N/A:N/A] Right Calcaneus N/A N/A Wound Location: Pressure Injury N/A N/A Wounding Event: Pressure Ulcer N/A N/A Primary Etiology: Hypertension, Rheumatoid Arthritis N/A N/A Comorbid History: 03/25/2022 N/A N/A Date Acquired: 4 N/A N/A Weeks of Treatment: Open N/A N/A Wound Status: Monica N/A N/A Wound Recurrence: 1.7x1.9x0.3 N/A N/A Measurements L x W x D (cm) 2.537 N/A N/A A (cm) : rea 0.761 N/A N/A Volume (cm) : -26.70% N/A N/A % Reduction in A rea: -280.50% N/A N/A % Reduction in Volume: Unstageable/Unclassified N/A N/A Classification: Medium N/A N/A Exudate A mount: Serosanguineous N/A N/A Exudate Type: red, brown N/A N/A Exudate Color: None Present (0%) N/A N/A Granulation A mount: Large (67-100%) N/A N/A Necrotic A mount: Eschar, Adherent  Slough N/A N/A Necrotic Tissue: Fascia: Monica N/A N/A Exposed Structures: Fat Layer (Subcutaneous Tissue): Monica Tendon: Monica Muscle: Monica Joint: Monica Bone: Monica None N/A N/A Epithelialization: Treatment Notes Electronic Signature(s) Signed: 06/22/2022 4:13:25 PM By: Yevonne Pax RN Entered By: Yevonne Pax on 06/20/2022 11:11:25 [name] ([id number]) [id number] Page 5 of 9 -------------------------------------------------------------------------------- Multi-Disciplinary Care Plan Details Patient Name: Date of Service: [name], [name]. 06/20/2022 11:00 A M Medical Record Number: [id number] Patient Account Number: [id number] Date of Birth/Sex: Treating RN: September 18, 1933 (89 y.o. Freddy Finner Primary Care  Dinh Ayotte: Bethann Punches Other Clinician: Referring Domonique Cothran: Treating Lional Icenogle/Extender: [name] in Treatment: 4 Active Inactive Necrotic Tissue Nursing Diagnoses: Impaired tissue integrity related to necrotic/devitalized tissue Knowledge deficit related to management of necrotic/devitalized tissue Goals: Necrotic/devitalized tissue will be minimized in the wound bed Date Initiated: 05/22/2022 Target Resolution Date: 06/22/2022 Goal Status: Active Patient/caregiver will verbalize understanding of reason and process for debridement of necrotic tissue Date Initiated: 05/22/2022 Target Resolution Date: 06/22/2022 Goal Status: Active Interventions: Assess patient pain level pre-, during and post procedure and prior to discharge Provide education on necrotic tissue and debridement process Treatment Activities: Apply topical anesthetic as ordered : 05/22/2022 Enzymatic debridement : 05/22/2022 Notes: Pressure Nursing Diagnoses: Knowledge deficit related to causes and risk factors for pressure ulcer development Knowledge deficit related to management of pressures ulcers Potential for impaired tissue integrity related to pressure, friction,  moisture, and shear Goals: Patient will remain free from development of additional pressure ulcers Date Initiated: 05/22/2022 Target Resolution Date: 06/22/2022 Goal Status: Active Patient will remain free of pressure ulcers Date Initiated: 05/22/2022 Target Resolution Date: 06/22/2022 Goal Status: Active Patient/caregiver will verbalize risk factors for pressure ulcer development Date Initiated: 05/22/2022 Target Resolution Date: 06/22/2022 Goal Status: Active Patient/caregiver will verbalize understanding of pressure ulcer management Date Initiated: 05/22/2022 Target Resolution Date: 06/22/2022 Goal Status: Active Interventions: Assess offloading mechanisms upon admission and as needed Assess potential for pressure ulcer upon admission and as needed Provide education on pressure ulcers Treatment Activities: T ordered outside of clinic : 05/22/2022 est [name], [name] ([id number]) [phone].pdf Page 6 of 9 Notes: Wound/Skin Impairment Nursing Diagnoses: Impaired tissue integrity Knowledge deficit related to ulceration/compromised skin integrity Goals: Patient/caregiver will verbalize understanding of skin care regimen Date Initiated: 05/22/2022 Target Resolution Date: 06/22/2022 Goal Status: Active Ulcer/skin breakdown will have a volume reduction of 30% by week 4 Date Initiated: 05/22/2022 Target Resolution Date: 06/22/2022 Goal Status: Active Ulcer/skin breakdown will have a volume reduction of 50% by week 8 Date Initiated: 05/22/2022 Target Resolution Date: 07/22/2022 Goal Status: Active Ulcer/skin breakdown will have a volume reduction of 80% by week 12 Date Initiated: 05/22/2022 Target Resolution Date: 08/22/2022 Goal Status: Active Ulcer/skin breakdown will heal within 14 weeks Date Initiated: 05/22/2022 Target Resolution Date: 09/05/2022 Goal Status: Active Interventions: Assess patient/caregiver ability to obtain necessary supplies Assess  patient/caregiver ability to perform ulcer/skin care regimen upon admission and as needed Assess ulceration(s) every visit Provide education on ulcer and skin care Treatment Activities: Referred to DME Seymour Pavlak for dressing supplies : 05/22/2022 Skin care regimen initiated : 05/22/2022 Topical wound management initiated : 05/22/2022 Notes: Electronic Signature(s) Signed: 06/22/2022 4:13:25 PM By: Yevonne Pax RN Entered By: Yevonne Pax on 06/20/2022 11:11:38 -------------------------------------------------------------------------------- Pain Assessment Details Patient Name: Date of Service: [name], [name]. 06/20/2022 11:00 A M Medical Record Number: [id number] Patient Account Number: [id number] Date of Birth/Sex: Treating RN: 1933-09-05 (89 y.o. Freddy Finner Primary Care Deagen Krass: Bethann Punches Other Clinician: Referring Trino Higinbotham: Treating Jaylan Hinojosa/Extender: [name] in Treatment: 4 Active Problems Location of Pain Severity and Description of Pain Patient Has Paino Monica Site Locations [name], [name] ([id number]) [id number] Page 7 of 9 Pain Management and Medication Current Pain Management: Electronic Signature(s) Signed: 06/22/2022 4:13:25 PM By: Yevonne Pax RN Entered By: Yevonne Pax on 06/20/2022 11:05:29 -------------------------------------------------------------------------------- Patient/Caregiver Education Details Patient Name: Date of Service: [name], [name]. 6/11/2024andnbsp11:00 A M Medical Record Number: [id number] Patient Account Number: [id number] Date of Birth/Gender: Treating RN: Apr 22, 1933 (89 y.o.  Freddy Finner Primary Care Physician: Bethann Punches Other Clinician: Referring Physician: Treating Physician/Extender: [name] in Treatment: 4 Education Assessment Education Provided To: Patient Education Topics Provided Wound/Skin Impairment: Handouts: Caring for Your Ulcer Methods:  Explain/Verbal Responses: State content correctly Electronic Signature(s) Signed: 06/22/2022 4:13:25 PM By: Yevonne Pax RN Entered By: Yevonne Pax on 06/20/2022 11:11:54 [name] ([id number]) [id number] Page 8 of 9 -------------------------------------------------------------------------------- Wound Assessment Details Patient Name: Date of Service: [name], [name]. 06/20/2022 11:00 A M Medical Record Number: [id number] Patient Account Number: [id number] Date of Birth/Sex: Treating RN: 05/13/1933 (89 y.o. Freddy Finner Primary Care Shineka Auble: Bethann Punches Other Clinician: Referring Willoughby Doell: Treating Damyah Gugel/Extender: [name] in Treatment: 4 Wound Status Wound Number: 1 Primary Etiology: Pressure Ulcer Wound Location: Right Calcaneus Wound Status: Open Wounding Event: Pressure Injury Comorbid History: Hypertension, Rheumatoid Arthritis Date Acquired: 03/25/2022 Weeks Of Treatment: 4 Clustered Wound: Monica Photos Wound Measurements Length: (cm) 1.7 Width: (cm) 1.9 Depth: (cm) 0.3 Area: (cm) 2.537 Volume: (cm) 0.761 % Reduction in Area: -26.7% % Reduction in Volume: -280.5% Epithelialization: None Tunneling: Monica Undermining: Monica Wound Description Classification: Unstageable/Unclassified Exudate Amount: Medium Exudate Type: Serosanguineous Exudate Color: red, brown Foul Odor After Cleansing: Monica Slough/Fibrino Yes Wound Bed Granulation Amount: None Present (0%) Exposed Structure Necrotic Amount: Large (67-100%) Fascia Exposed: Monica Necrotic Quality: Eschar, Adherent Slough Fat Layer (Subcutaneous Tissue) Exposed: Monica Tendon Exposed: Monica Muscle Exposed: Monica Joint Exposed: Monica Bone Exposed: Monica Treatment Notes Wound #1 (Calcaneus) Wound Laterality: Right Cleanser Byram Ancillary Kit - 15 Day Supply Discharge Instruction: Use supplies as instructed; Kit contains: (15) Saline Bullets; (15) 3x3 Gauze; 15 pr Gloves Soap and  [name], [name] ([id number]) [phone].pdf Page 9 of 9 Discharge Instruction: Gently cleanse wound with antibacterial soap, rinse and pat dry prior to dressing wounds Peri-Wound Care Topical Santyl Collagenase Ointment, 30 (gm), tube Discharge Instruction: apply nickel thick to wound bed only Primary Dressing Gauze Discharge Instruction: moistened with saline Secondary Dressing (BORDER) Zetuvit Plus SILICONE BORDER Dressing 4x4 (in/in) Discharge Instruction: Please do not put silicone bordered dressings under wraps. Use non-bordered dressing only. Secured With Compression Wrap Compression Stockings Facilities manager) Signed: 06/22/2022 4:13:25 PM By: Yevonne Pax RN Entered By: Yevonne Pax on 06/20/2022 11:10:36 -------------------------------------------------------------------------------- Vitals Details Patient Name: Date of Service: [name], [name]. 06/20/2022 11:00 A M Medical Record Number: [id number] Patient Account Number: [id number] Date of Birth/Sex: Treating RN: 27-Jul-1933 (89 y.o. Freddy Finner Primary Care Palin Tristan: Bethann Punches Other Clinician: Referring Lewis Grivas: Treating Oneill Bais/Extender: [name] in Treatment: 4 Vital Signs Time Taken: 11:05 Temperature (F): 98.7 Height (in): 62 Pulse (bpm): 89 Weight (lbs): 118 Respiratory Rate (breaths/min): 18 Body Mass Index (BMI): 21.6 Blood Pressure (mmHg): 122/70 Reference Range: 80 - 120 mg / dl Electronic Signature(s) Signed: 06/22/2022 4:13:25 PM By: Yevonne Pax RN Entered By: Yevonne Pax on 06/20/2022 11:05:23

## 2022-06-22 ENCOUNTER — Ambulatory Visit: Payer: Medicare Other | Admitting: Physician Assistant

## 2022-06-24 NOTE — Progress Notes (Signed)
AYRIAN, CARRISALEZ ([id number]) [id number] Page 1 of 8 Visit Report for 06/08/2022 Arrival Information Details Patient Name: Date of Service: [facility], [name] 06/08/2022 3:30 PM Medical Record Number: [id number] Patient Account Number: [id number] Date of Birth/Sex: Treating RN: [birth year] (89 y.o. Skip Mayer Primary Care Skyla Champagne: Bethann Punches Other Clinician: Referring Aidah Forquer: Treating Jlee Harkless/Extender: [name] in Treatment: 2 Visit Information History Since Last Visit Added or deleted any medications: No Patient Arrived: Ambulatory Has Dressing in Place as Prescribed: Yes Arrival Time: 15:43 Pain Present Now: Yes Accompanied By: sister Transfer Assistance: None Patient Identification Verified: Yes Secondary Verification Process Completed: Yes Patient Requires Transmission-Based Precautions: No Patient Has Alerts: Yes Patient Alerts: NOT Diabetic PAD Bilateral ABI L1.08TBI .70 05/25/22 ABI R .97 TBI .70 05/25/22 Electronic Signature(s) Signed: 06/12/2022 9:12:20 AM By: Elliot Gurney, BSN, RN, CWS, Kim RN, BSN Entered By: Elliot Gurney, BSN, RN, CWS, Kim on 06/08/2022 15:47:26 -------------------------------------------------------------------------------- Encounter Discharge Information Details Patient Name: Date of Service: [name], [name] 06/08/2022 3:30 PM Medical Record Number: [id number] Patient Account Number: [id number] Date of Birth/Sex: Treating RN: 04/13/1933 (89 y.o. Skip Mayer Primary Care Parrie Rasco: Bethann Punches Other Clinician: Referring Shariq Puig: Treating Infiniti Hoefling/Extender: [name] in Treatment: 2 Encounter Discharge Information Items Post Procedure Vitals Discharge Condition: Stable Temperature (F): 98.5 Ambulatory Status: Ambulatory Pulse (bpm): 105 Discharge Destination: Home Respiratory Rate (breaths/min): 18 Transportation: Private Auto Blood Pressure (mmHg): 113/68 Schedule Follow-up  Appointment: Yes Clinical Summary of CareALYSS, HINDMAN ([id number]) [id number] Page 2 of 8 Electronic Signature(s) Signed: 06/23/2022 11:14:10 AM By: Elliot Gurney, BSN, RN, CWS, Kim RN, BSN Entered By: Elliot Gurney, BSN, RN, CWS, Kim on 06/23/2022 11:14:10 -------------------------------------------------------------------------------- Lower Extremity Assessment Details Patient Name: Date of Service: [facility], [name] 06/08/2022 3:30 PM Medical Record Number: [id number] Patient Account Number: [id number] Date of Birth/Sex: Treating RN: [birth year] (89 y.o. Skip Mayer Primary Care Bee Hammerschmidt: Bethann Punches Other Clinician: Referring Yarel Rushlow: Treating Amos Micheals/Extender: [name] in Treatment: 2 Vascular Assessment Pulses: Dorsalis Pedis Palpable: [Right:Yes] Posterior Tibial Palpable: [Right:Yes] Electronic Signature(s) Signed: 06/12/2022 9:12:20 AM By: Elliot Gurney, BSN, RN, CWS, Kim RN, BSN Entered By: Elliot Gurney, BSN, RN, CWS, Kim on 06/08/2022 15:58:06 -------------------------------------------------------------------------------- Multi Wound Chart Details Patient Name: Date of Service: [name], [name] 06/08/2022 3:30 PM Medical Record Number: [id number] Patient Account Number: [id number] Date of Birth/Sex: Treating RN: [birth year] (89 y.o. Skip Mayer Primary Care Jazalynn Mireles: Bethann Punches Other Clinician: Referring Odalys Win: Treating Evonne Rinks/Extender: [name] in Treatment: 2 Vital Signs Height(in): 62 Pulse(bpm): 105 Weight(lbs): 118 Blood Pressure(mmHg): 113/68 Body Mass Index(BMI): 21.6 Temperature(F): 98.5 Respiratory Rate(breaths/min): 16 [1:Photos:] [N/A:N/A] Right Calcaneus N/A N/A Wound Location: Pressure Injury N/A N/A Wounding Event: Pressure Ulcer N/A N/A Primary Etiology: Hypertension, Rheumatoid Arthritis N/A N/A Comorbid History: 03/25/2022 N/A N/A Date Acquired: 2 N/A N/A Weeks of Treatment: Open N/A  N/A Wound Status: No N/A N/A Wound Recurrence: 1.6x1.6x0.3 N/A N/A Measurements L x W x D (cm) 2.011 N/A N/A A (cm) : rea 0.603 N/A N/A Volume (cm) : -0.40% N/A N/A % Reduction in A rea: -201.50% N/A N/A % Reduction in Volume: Unstageable/Unclassified N/A N/A Classification: Medium N/A N/A Exudate A mount: Serosanguineous N/A N/A Exudate Type: red, brown N/A N/A Exudate Color: None Present (0%) N/A N/A Granulation A mount: Large (67-100%) N/A N/A Necrotic A mount: Eschar, Adherent Slough N/A N/A Necrotic Tissue: Fascia: No N/A N/A Exposed Structures: Fat Layer (Subcutaneous Tissue): No Tendon:  No Muscle: No Joint: No Bone: No None N/A N/A Epithelialization: Debridement - Excisional N/A N/A Debridement: Pre-procedure Verification/Time Out 04:20 N/A N/A Taken: Lidocaine N/A N/A Pain Control: Necrotic/Eschar, Fat, Subcutaneous, N/A N/A Tissue Debrided: Slough Skin/Subcutaneous Tissue N/A N/A Level: 2.01 N/A N/A Debridement A (sq cm): rea Curette, Forceps, Scissors N/A N/A Instrument: Minimum N/A N/A Bleeding: Pressure N/A N/A Hemostasis Achieved: 2 N/A N/A Procedural Pain: 1 N/A N/A Post Procedural Pain: Debridement Treatment Response: Procedure was tolerated well N/A N/A Post Debridement Measurements L x 1.6x1.6x1 N/A N/A W x D (cm) 2.011 N/A N/A Post Debridement Volume: (cm) Unstageable/Unclassified N/A N/A Post Debridement Stage: Debridement N/A N/A Procedures Performed: Treatment Notes Electronic Signature(s) Signed: 06/23/2022 11:11:43 AM By: Elliot Gurney, BSN, RN, CWS, Kim RN, BSN Entered By: Elliot Gurney, BSN, RN, CWS, Kim on 06/23/2022 11:11:43 -------------------------------------------------------------------------------- Multi-Disciplinary Care Plan Details Patient Name: Date of Service: [facility], [name] 06/08/2022 3:30 PM Medical Record Number: [id number] Patient Account Number: [id number] Date of Birth/Sex: Treating RN: 05-Mar-1933 (89 y.o. Skip Mayer Primary Care Ly Bacchi: Bethann Punches Other Clinician: Referring Ray Gervasi: Treating Brooklen Runquist/Extender: Janith Lima [place], Lewis Moccasin ([id number]) [id number] Page 4 of 8 Weeks in Treatment: 2 Active Inactive Necrotic Tissue Nursing Diagnoses: Impaired tissue integrity related to necrotic/devitalized tissue Knowledge deficit related to management of necrotic/devitalized tissue Goals: Necrotic/devitalized tissue will be minimized in the wound bed Date Initiated: 05/22/2022 Target Resolution Date: 06/22/2022 Goal Status: Active Patient/caregiver will verbalize understanding of reason and process for debridement of necrotic tissue Date Initiated: 05/22/2022 Target Resolution Date: 06/22/2022 Goal Status: Active Interventions: Assess patient pain level pre-, during and post procedure and prior to discharge Provide education on necrotic tissue and debridement process Treatment Activities: Apply topical anesthetic as ordered : 05/22/2022 Enzymatic debridement : 05/22/2022 Notes: Orientation to the Wound Care Program Nursing Diagnoses: Knowledge deficit related to the wound healing center program Goals: Patient/caregiver will verbalize understanding of the Wound Healing Center Program Date Initiated: 05/22/2022 Target Resolution Date: 06/05/2022 Goal Status: Active Interventions: Provide education on orientation to the wound center Notes: Pressure Nursing Diagnoses: Knowledge deficit related to causes and risk factors for pressure ulcer development Knowledge deficit related to management of pressures ulcers Potential for impaired tissue integrity related to pressure, friction, moisture, and shear Goals: Patient will remain free from development of additional pressure ulcers Date Initiated: 05/22/2022 Target Resolution Date: 06/22/2022 Goal Status: Active Patient will remain free of pressure ulcers Date Initiated: 05/22/2022 Target Resolution  Date: 06/22/2022 Goal Status: Active Patient/caregiver will verbalize risk factors for pressure ulcer development Date Initiated: 05/22/2022 Target Resolution Date: 06/22/2022 Goal Status: Active Patient/caregiver will verbalize understanding of pressure ulcer management Date Initiated: 05/22/2022 Target Resolution Date: 06/22/2022 Goal Status: Active Interventions: Assess offloading mechanisms upon admission and as needed Assess potential for pressure ulcer upon admission and as needed Provide education on pressure ulcers Treatment Activities: T ordered outside of clinic : 05/22/2022 est Notes: GRISELA, KIDNEY ([id number]) [phone].pdf Page 5 of 8 Nursing Diagnoses: Impaired tissue integrity Knowledge deficit related to ulceration/compromised skin integrity Goals: Patient/caregiver will verbalize understanding of skin care regimen Date Initiated: 05/22/2022 Target Resolution Date: 06/22/2022 Goal Status: Active Ulcer/skin breakdown will have a volume reduction of 30% by week 4 Date Initiated: 05/22/2022 Target Resolution Date: 06/22/2022 Goal Status: Active Ulcer/skin breakdown will have a volume reduction of 50% by week 8 Date Initiated: 05/22/2022 Target Resolution Date: 07/22/2022 Goal Status: Active Ulcer/skin breakdown will have a volume reduction of 80% by week 12 Date  Initiated: 05/22/2022 Target Resolution Date: 08/22/2022 Goal Status: Active Ulcer/skin breakdown will heal within 14 weeks Date Initiated: 05/22/2022 Target Resolution Date: 09/05/2022 Goal Status: Active Interventions: Assess patient/caregiver ability to obtain necessary supplies Assess patient/caregiver ability to perform ulcer/skin care regimen upon admission and as needed Assess ulceration(s) every visit Provide education on ulcer and skin care Treatment Activities: Referred to DME Draeden Kellman for dressing supplies : 05/22/2022 Skin care regimen initiated :  05/22/2022 Topical wound management initiated : 05/22/2022 Notes: Electronic Signature(s) Signed: 06/23/2022 11:12:09 AM By: Elliot Gurney, BSN, RN, CWS, Kim RN, BSN Entered By: Elliot Gurney, BSN, RN, CWS, Kim on 06/23/2022 11:12:09 -------------------------------------------------------------------------------- Pain Assessment Details Patient Name: Date of Service: [name], [name] 06/08/2022 3:30 PM Medical Record Number: [id number] Patient Account Number: [id number] Date of Birth/Sex: Treating RN: 1933-06-01 (89 y.o. Skip Mayer Primary Care Gaspar Fowle: Bethann Punches Other Clinician: Referring Denyla Cortese: Treating Melvenia Favela/Extender: [name] in Treatment: 2 Active Problems Location of Pain Severity and Description of Pain Patient Has Paino No Site Locations [place], [place] J ([id number]) [id number] Page 6 of 8 Pain Management and Medication Current Pain Management: Electronic Signature(s) Signed: 06/12/2022 9:12:20 AM By: Elliot Gurney, BSN, RN, CWS, Kim RN, BSN Entered By: Elliot Gurney, BSN, RN, CWS, Kim on 06/08/2022 15:48:53 -------------------------------------------------------------------------------- Patient/Caregiver Education Details Patient Name: Date of Service: [name], [name] 5/30/2024andnbsp3:30 PM Medical Record Number: [id number] Patient Account Number: [id number] Date of Birth/Gender: Treating RN: 28-Aug-1933 (89 y.o. Skip Mayer Primary Care Physician: Bethann Punches Other Clinician: Referring Physician: Treating Physician/Extender: [name] in Treatment: 2 Education Assessment Education Provided To: Patient Education Topics Provided Wound/Skin Impairment: Handouts: Caring for Your Ulcer, Other: continue wound care as ordered Methods: Demonstration, Explain/Verbal Responses: State content correctly Electronic Signature(s) Unsigned Entered By: Elliot Gurney, BSN, RN, CWS, Kim on 06/23/2022 11:12:37 Signature(s): ANGIELA, MULLEN  ([id number]) [id number] Date(s): [id number] Page 7 of 8 -------------------------------------------------------------------------------- Wound Assessment Details Patient Name: Date of Service: [name], [name] 06/08/2022 3:30 PM Medical Record Number: [id number] Patient Account Number: [id number] Date of Birth/Sex: Treating RN: [birth year] (89 y.o. Cathlean Cower, Kim Primary Care Lyndell Gillyard: Bethann Punches Other Clinician: Referring Cung Masterson: Treating Marijah Larranaga/Extender: [name] in Treatment: 2 Wound Status Wound Number: 1 Primary Etiology: Pressure Ulcer Wound Location: Right Calcaneus Wound Status: Open Wounding Event: Pressure Injury Comorbid History: Hypertension, Rheumatoid Arthritis Date Acquired: 03/25/2022 Weeks Of Treatment: 2 Clustered Wound: No Photos Wound Measurements Length: (cm) 1.6 Width: (cm) 1.6 Depth: (cm) 0.3 Area: (cm) 2.011 Volume: (cm) 0.603 % Reduction in Area: -0.4% % Reduction in Volume: -201.5% Epithelialization: None Wound Description Classification: Unstageable/Unclassified Exudate Amount: Medium Exudate Type: Serosanguineous Exudate Color: red, brown Foul Odor After Cleansing: No Slough/Fibrino Yes Wound Bed Granulation Amount: None Present (0%) Exposed Structure Necrotic Amount: Large (67-100%) Fascia Exposed: No Necrotic Quality: Eschar, Adherent Slough Fat Layer (Subcutaneous Tissue) Exposed: No Tendon Exposed: No Muscle Exposed: No Joint Exposed: No Bone Exposed: No Electronic Signature(s) Signed: 06/12/2022 9:12:20 AM By: Elliot Gurney, BSN, RN, CWS, Kim RN, BSN Entered By: Elliot Gurney, BSN, RN, CWS, Kim on 06/08/2022 15:55:47 [employer] ([id number]) [id number] Page 8 of 8 -------------------------------------------------------------------------------- Vitals Details Patient Name: Date of Service: [name], [name] 06/08/2022 3:30 PM Medical Record Number: [id number] Patient Account  Number: [id number] Date of Birth/Sex: Treating RN: 1933/11/12 (89 y.o. Skip Mayer Primary Care Daniyla Pfahler: Bethann Punches Other Clinician: Referring Guilherme Schwenke: Treating Rashel Okeefe/Extender: [name] in Treatment: 2 Vital Signs Time Taken: 15:47  Temperature (F): 98.5 Height (in): 62 Pulse (bpm): 105 Weight (lbs): 118 Respiratory Rate (breaths/min): 16 Body Mass Index (BMI): 21.6 Blood Pressure (mmHg): 113/68 Reference Range: 80 - 120 mg / dl Electronic Signature(s) Signed: 06/12/2022 9:12:20 AM By: Elliot Gurney, BSN, RN, CWS, Kim RN, BSN Entered By: Elliot Gurney, BSN, RN, CWS, Kim on 06/08/2022 15:47:53

## 2022-06-25 NOTE — Progress Notes (Signed)
[name], [name] ([id number]) [id number] Page 1 of 7 Visit Report for 06/08/2022 Chief Complaint Document Details Patient Name: Date of Service: [facility], [name]. 06/08/2022 3:30 PM Medical Record Number: [id number] Patient Account Number: [id number] Date of Birth/Sex: Treating RN: [birth year] (89 y.o. Skip Mayer Primary Care Provider: Bethann Punches Other Clinician: Referring Provider: Treating Provider/Extender: [name] in Treatment: 2 Information Obtained from: Patient Chief Complaint Right heel pressure ulcer Electronic Signature(s) Signed: 06/08/2022 3:33:14 PM By: Allen Derry PA-C Entered By: Allen Derry on 06/08/2022 15:33:14 -------------------------------------------------------------------------------- Debridement Details Patient Name: Date of Service: [name], [name]. 06/08/2022 3:30 PM Medical Record Number: [id number] Patient Account Number: [id number] Date of Birth/Sex: Treating RN: [birth year] (89 y.o. Skip Mayer Primary Care Provider: Bethann Punches Other Clinician: Referring Provider: Treating Provider/Extender: [name] in Treatment: 2 Debridement Performed for Assessment: Wound #1 Right Calcaneus Performed By: Physician Allen Derry, PA-C Debridement Type: Debridement Level of Consciousness (Pre-procedure): Awake and Alert Pre-procedure Verification/Time Out Yes - 04:20 Taken: Start Time: 04:20 Pain Control: Lidocaine Percent of Wound Bed Debrided: 100% T Area Debrided (cm): otal 2.01 Tissue and other material debrided: Viable, Non-Viable, Eschar, Fat, Slough, Subcutaneous, Slough Level: Skin/Subcutaneous Tissue Debridement Description: Excisional Instrument: Curette, Forceps, Scissors Bleeding: Minimum Hemostasis Achieved: Pressure End Time: 04:25 Procedural Pain: 2 Post Procedural Pain: 1 [name], [name] ([id number]) [id number] Page 2 of 7 Response to  Treatment: Procedure was tolerated well Level of Consciousness (Post- Awake and Alert procedure): Post Debridement Measurements of Total Wound Length: (cm) 1.6 Stage: Unstageable/Unclassified Width: (cm) 1.6 Depth: (cm) 1 Volume: (cm) 2.011 Character of Wound/Ulcer Post Debridement: Improved Post Procedure Diagnosis Same as Pre-procedure Electronic Signature(s) Signed: 06/08/2022 4:27:50 PM By: Allen Derry PA-C Signed: 06/12/2022 9:12:20 AM By: Elliot Gurney, BSN, RN, CWS, Kim RN, BSN Entered By: Allen Derry on 06/08/2022 16:27:50 -------------------------------------------------------------------------------- HPI Details Patient Name: Date of Service: [name], [name]. 06/08/2022 3:30 PM Medical Record Number: [id number] Patient Account Number: [id number] Date of Birth/Sex: Treating RN: Jun 18, 1933 (89 y.o. Skip Mayer Primary Care Provider: Bethann Punches Other Clinician: Referring Provider: Treating Provider/Extender: [name] in Treatment: 2 History of Present Illness HPI Description: 05-22-2022 upon evaluation today patient appears to be having issues with the wound on her right heel. This actually occurred during a surgical intervention for her left knee she had a knee replacement. She went in with no issues and when she got home or even while she was staying overnight before being discharged home noted to have an issue with a discolored area on her right heel. She is unsure how this happened but knows it was not present prior to the surgery. She has been monitoring since it is now an eschar covered region that obviously is going to need to be removed in order to allow this to heal. With that being said her ABIs were not registering very well NuShield today it appears that she may have a issue with good arterial flow this could be somewhat accounting for what is going on with the heel. Nonetheless I do think she is probably going to be seen by a vein and vascular for  arterial studies. She had the surgery on Friday and came home on a Saturday. This was a very short stay. Patient does have a history of rheumatoid arthritis but really no other major medical problems. 05-29-2022 upon evaluation today patient appears to be doing well currently in regard to her wound.  In fact she is telling me that she is doing quite well at this point. Fortunately there does not appear to be any signs of active infection locally nor systemically at this time which is great news. No fevers, chills, nausea, vomiting, or diarrhea. The good news that she did get the Santyl although it took until Friday. 06-08-2022 upon evaluation patient appears to be making progress towards closure in regard to her wound. This was actually a little bit larger today but again we have been trying to loosen up a lot of the necrotic tissue. We Argun have to perform some debridement as well. Electronic Signature(s) Signed: 06/23/2022 1:55:01 PM By: Allen Derry PA-C Entered By: Allen Derry on 06/23/2022 13:55:01 [name] ([id number]) [id number] Page 3 of 7 -------------------------------------------------------------------------------- Physical Exam Details Patient Name: Date of Service: [name], [name]. 06/08/2022 3:30 PM Medical Record Number: [id number] Patient Account Number: [id number] Date of Birth/Sex: Treating RN: 21-Sep-1933 (89 y.o. Skip Mayer Primary Care Provider: Bethann Punches Other Clinician: Referring Provider: Treating Provider/Extender: [name] in Treatment: 2 Constitutional Well-nourished and well-hydrated in no acute distress. Respiratory normal breathing without difficulty. Psychiatric this patient is able to make decisions and demonstrates good insight into disease process. Alert and Oriented x 3. pleasant and cooperative. Notes Upon inspection patient's wound bed actually showed signs of good granulation epithelization at  this point. Fortunately I do not see any evidence of worsening overall and I do believe that we are moving in the right direction here. Postdebridement this looks much better. Electronic Signature(s) Signed: 06/23/2022 1:55:18 PM By: Allen Derry PA-C Entered By: Allen Derry on 06/23/2022 13:55:18 -------------------------------------------------------------------------------- Physician Orders Details Patient Name: Date of Service: [name], [name]. 06/08/2022 3:30 PM Medical Record Number: [id number] Patient Account Number: [id number] Date of Birth/Sex: Treating RN: 12/14/1933 (89 y.o. Skip Mayer Primary Care Provider: Bethann Punches Other Clinician: Referring Provider: Treating Provider/Extender: [name] in Treatment: 2 Verbal / Phone Orders: No Diagnosis Coding ICD-10 Coding Code Description L89.610 Pressure ulcer of right heel, unstageable M05.80 Other rheumatoid arthritis with rheumatoid factor of unspecified site Follow-up Appointments Return Appointment in 1 week. Bathing/ [employer] wounds with antibacterial soap and water. - [address] Robertson [name], [name] ([id number]) [id number] Page 4 of 7 Anesthetic (Use 'Patient Medications' Section for Anesthetic Order Entry) Lidocaine applied to wound bed Wound Treatment Wound #1 - Calcaneus Wound Laterality: Right Cleanser: Byram Ancillary Kit - 15 Day Supply (Generic) 1 x Per Day/30 Days Discharge Instructions: Use supplies as instructed; Kit contains: (15) Saline Bullets; (15) 3x3 Gauze; 15 pr Gloves Cleanser: Soap and Water 1 x Per Day/30 Days Discharge Instructions: Gently cleanse wound with antibacterial soap, rinse and pat dry prior to dressing wounds Topical: Santyl Collagenase Ointment, 30 (gm), tube 1 x Per Day/30 Days Discharge Instructions: apply nickel thick to wound bed only Prim Dressing: Gauze (Generic) 1 x Per Day/30 Days ary Discharge Instructions: moistened with  saline Secondary Dressing: (BORDER) Zetuvit Plus SILICONE BORDER Dressing 4x4 (in/in) (Generic) 1 x Per Day/30 Days Discharge Instructions: Please do not put silicone bordered dressings under wraps. Use non-bordered dressing only. Electronic Signature(s) Signed: 06/23/2022 11:11:01 AM By: Elliot Gurney, BSN, RN, CWS, Kim RN, BSN Signed: 06/23/2022 2:03:42 PM By: Allen Derry PA-C Entered By: Elliot Gurney BSN, RN, CWS, Kim on 06/23/2022 11:11:00 -------------------------------------------------------------------------------- Problem List Details Patient Name: Date of Service: [name], [name]. 06/08/2022 3:30 PM Medical Record Number: [id number] Patient Account Number:  [id number] Date of Birth/Sex: Treating RN: 03-24-1933 (89 y.o. Skip Mayer Primary Care Provider: Bethann Punches Other Clinician: Referring Provider: Treating Provider/Extender: [name] in Treatment: 2 Active Problems ICD-10 Encounter Code Description Active Date MDM Diagnosis L89.610 Pressure ulcer of right heel, unstageable 05/22/2022 No Yes M05.80 Other rheumatoid arthritis with rheumatoid factor of unspecified site 05/22/2022 No Yes Inactive Problems Resolved Problems Electronic Signature(s) Signed: 06/08/2022 3:33:10 PM By: Allen Derry PA-C Entered By: Allen Derry on 06/08/2022 15:33:10 [name] ([id number]) [id number] Page 5 of 7 -------------------------------------------------------------------------------- Progress Note Details Patient Name: Date of Service: [name], [name]. 06/08/2022 3:30 PM Medical Record Number: [id number] Patient Account Number: [id number] Date of Birth/Sex: Treating RN: 1933-09-26 (89 y.o. Skip Mayer Primary Care Provider: Bethann Punches Other Clinician: Referring Provider: Treating Provider/Extender: [name] in Treatment: 2 Subjective Chief Complaint Information obtained from Patient Right heel pressure ulcer History of  Present Illness (HPI) 05-22-2022 upon evaluation today patient appears to be having issues with the wound on her right heel. This actually occurred during a surgical intervention for her left knee she had a knee replacement. She went in with no issues and when she got home or even while she was staying overnight before being discharged home noted to have an issue with a discolored area on her right heel. She is unsure how this happened but knows it was not present prior to the surgery. She has been monitoring since it is now an eschar covered region that obviously is going to need to be removed in order to allow this to heal. With that being said her ABIs were not registering very well NuShield today it appears that she may have a issue with good arterial flow this could be somewhat accounting for what is going on with the heel. Nonetheless I do think she is probably going to be seen by a vein and vascular for arterial studies. She had the surgery on Friday and came home on a Saturday. This was a very short stay. Patient does have a history of rheumatoid arthritis but really no other major medical problems. 05-29-2022 upon evaluation today patient appears to be doing well currently in regard to her wound. In fact she is telling me that she is doing quite well at this point. Fortunately there does not appear to be any signs of active infection locally nor systemically at this time which is great news. No fevers, chills, nausea, vomiting, or diarrhea. The good news that she did get the Santyl although it took until Friday. 06-08-2022 upon evaluation patient appears to be making progress towards closure in regard to her wound. This was actually a little bit larger today but again we have been trying to loosen up a lot of the necrotic tissue. We Argun have to perform some debridement as well. Objective Constitutional Well-nourished and well-hydrated in no acute distress. Vitals Time Taken: 3:47 PM, Height:  62 in, Weight: 118 lbs, BMI: 21.6, Temperature: 98.5 F, Pulse: 105 bpm, Respiratory Rate: 16 breaths/min, Blood Pressure: 113/68 mmHg. Respiratory normal breathing without difficulty. Psychiatric this patient is able to make decisions and demonstrates good insight into disease process. Alert and Oriented x 3. pleasant and cooperative. General Notes: Upon inspection patient's wound bed actually showed signs of good granulation epithelization at this point. Fortunately I do not see any evidence of worsening overall and I do believe that we are moving in the right direction here. Postdebridement this looks much better.  Integumentary (Hair, Skin) Wound #1 status is Open. Original cause of wound was Pressure Injury. The date acquired was: 03/25/2022. The wound has been in treatment 2 weeks. The wound is located on the Right Calcaneus. The wound measures 1.6cm length x 1.6cm width x 0.3cm depth; 2.011cm^2 area and 0.603cm^3 volume. There is a medium amount of serosanguineous drainage noted. There is no granulation within the wound bed. There is a large (67-100%) amount of necrotic tissue within the wound bed including Eschar and Adherent Slough. Assessment [name], [name] ([id number]) [id number] Page 6 of 7 Active Problems ICD-10 Pressure ulcer of right heel, unstageable Other rheumatoid arthritis with rheumatoid factor of unspecified site Procedures Wound #1 Pre-procedure diagnosis of Wound #1 is a Pressure Ulcer located on the Right Calcaneus . There was a Excisional Skin/Subcutaneous Tissue Debridement with a total area of 2.01 sq cm performed by Allen Derry, PA-C. With the following instrument(s): Curette, Forceps, and Scissors to remove Viable and Non-Viable tissue/material. Material removed includes Fat, Eschar, Subcutaneous Tissue, and Slough after achieving pain control using Lidocaine. A time out was conducted at 04:20, prior to the start of the procedure. A  Minimum amount of bleeding was controlled with Pressure. The procedure was tolerated well with a pain level of 2 throughout and a pain level of 1 following the procedure. Post Debridement Measurements: 1.6cm length x 1.6cm width x 1cm depth; 2.011cm^3 volume. Post debridement Stage noted as Unstageable/Unclassified. Character of Wound/Ulcer Post Debridement is improved. Post procedure Diagnosis Wound #1: Same as Pre-Procedure Plan Follow-up Appointments: Return Appointment in 1 week. Bathing/ Shower/ Hygiene: Wash wounds with antibacterial soap and water. [name] Anesthetic (Use 'Patient Medications' Section for Anesthetic Order Entry): Lidocaine applied to wound bed WOUND #1: - Calcaneus Wound Laterality: Right Cleanser: Byram Ancillary Kit - 15 Day Supply (Generic) 1 x Per Day/30 Days Discharge Instructions: Use supplies as instructed; Kit contains: (15) Saline Bullets; (15) 3x3 Gauze; 15 pr Gloves Cleanser: Soap and Water 1 x Per Day/30 Days Discharge Instructions: Gently cleanse wound with antibacterial soap, rinse and pat dry prior to dressing wounds Topical: Santyl Collagenase Ointment, 30 (gm), tube 1 x Per Day/30 Days Discharge Instructions: apply nickel thick to wound bed only Prim Dressing: Gauze (Generic) 1 x Per Day/30 Days ary Discharge Instructions: moistened with saline Secondary Dressing: (BORDER) Zetuvit Plus SILICONE BORDER Dressing 4x4 (in/in) (Generic) 1 x Per Day/30 Days Discharge Instructions: Please do not put silicone bordered dressings under wraps. Use non-bordered dressing only. 1. I would recommend that we have the patient continue to monitor for any signs of infection or worsening. Based on lab seeing I do think that she is headed in the right direction. 2 I am also going to recommend the Zetuvit over top of the Santyl which I think is doing a good job at protecting the heel as well this is great news. We will see patient back for reevaluation in 1 week here  in the clinic. If anything worsens or changes patient will contact our office for additional recommendations. Electronic Signature(s) Signed: 06/23/2022 1:55:55 PM By: Allen Derry PA-C Entered By: Allen Derry on 06/23/2022 13:55:55 -------------------------------------------------------------------------------- SuperBill Details Patient Name: Date of Service: [name], [name]. 06/08/2022 Medical Record Number: [id number] Patient Account Number: [id number] Date of Birth/Sex: Treating RN: [birth year] (89 y.o. Skip Mayer [place], [place] Robertson ([id number]) [id number] Page 7 of 7 Primary Care Provider: Bethann Punches Other Clinician: Referring Provider: Treating Provider/Extender: [name] in Treatment: 2 Diagnosis Coding  ICD-10 Codes Code Description L89.610 Pressure ulcer of right heel, unstageable M05.80 Other rheumatoid arthritis with rheumatoid factor of unspecified site Facility Procedures : CPT4 Code: 16109604 Description: 11042 - DEB SUBQ TISSUE 20 SQ CM/< ICD-10 Diagnosis Description L89.610 Pressure ulcer of right heel, unstageable Modifier: Quantity: 1 Physician Procedures : CPT4 Code Description Modifier [id number] 11042 - WC PHYS SUBQ TISS 20 SQ CM ICD-10 Diagnosis Description L89.610 Pressure ulcer of right heel, unstageable Quantity: 1 Electronic Signature(s) Signed: 06/23/2022 1:56:02 PM By: Allen Derry PA-C Previous Signature: 06/23/2022 11:12:00 AM Version By: Elliot Gurney, BSN, RN, CWS, Kim RN, BSN Entered By: Allen Derry on 06/23/2022 13:56:02

## 2022-06-27 ENCOUNTER — Encounter: Payer: Medicare Other | Admitting: Physician Assistant

## 2022-06-27 DIAGNOSIS — L89610 Pressure ulcer of right heel, unstageable: Secondary | ICD-10-CM | POA: Diagnosis not present

## 2022-06-27 NOTE — Progress Notes (Addendum)
Monica, Robertson ([id number]) [id number] Page 1 of 7 Visit Report for 06/27/2022 Chief Complaint Document Details Patient Name: Date of Service: [facility], [name] 06/27/2022 1:15 PM Medical Record Number: [id number] Patient Account Number: [id number] Date of Birth/Sex: Treating RN: 1933-01-19 (89 y.o. [name] Primary Care Provider: Bethann Punches Other Clinician: Referring Provider: Treating Provider/Extender: [name] in Treatment: 5 Information Obtained from: Patient Chief Complaint Right heel pressure ulcer Electronic Signature(s) Signed: 06/27/2022 1:38:52 PM By: Allen Derry PA-C Entered By: Allen Derry on 06/27/2022 13:38:52 -------------------------------------------------------------------------------- Debridement Details Patient Name: Date of Service: [name], [name] 06/27/2022 1:15 PM Medical Record Number: [id number] Patient Account Number: [id number] Date of Birth/Sex: Treating RN: [birth year] (89 y.o. [name] Primary Care Provider: Bethann Punches Other Clinician: Referring Provider: Treating Provider/Extender: [name] in Treatment: 5 Debridement Performed for Assessment: Wound #1 Right Calcaneus Performed By: Physician Allen Derry, PA-C Debridement Type: Debridement Level of Consciousness (Pre-procedure): Awake and Alert Pre-procedure Verification/Time Out Yes - 13:47 Taken: Start Time: 13:47 Pain Control: Lidocaine 4% T opical Solution Percent of Wound Bed Debrided: 100% T Area Debrided (cm): otal 2.12 Tissue and other material debrided: Viable, Non-Viable, Slough, Subcutaneous, Slough Level: Skin/Subcutaneous Tissue Debridement Description: Excisional Instrument: Curette Bleeding: Minimum Hemostasis Achieved: Pressure Procedural Pain: 0 Post Procedural Pain: 0 Response to Treatment: Procedure was tolerated well [name], [name] ([id number]) [id number]  Page 2 of 7 Level of Consciousness (Post- Awake and Alert procedure): Post Debridement Measurements of Total Wound Length: (cm) 1.5 Stage: Unstageable/Unclassified Width: (cm) 1.8 Depth: (cm) 0.4 Volume: (cm) 0.848 Character of Wound/Ulcer Post Debridement: Stable Post Procedure Diagnosis Same as Pre-procedure Electronic Signature(s) Signed: 06/29/2022 4:40:27 PM By: Midge Aver MSN RN CNS WTA Signed: 06/29/2022 5:52:14 PM By: Allen Derry PA-C Entered By: Midge Aver on 06/27/2022 13:49:31 -------------------------------------------------------------------------------- HPI Details Patient Name: Date of Service: [name], [name] 06/27/2022 1:15 PM Medical Record Number: [id number] Patient Account Number: [id number] Date of Birth/Sex: Treating RN: 05-06-1933 (89 y.o. [name] Primary Care Provider: Bethann Punches Other Clinician: Referring Provider: Treating Provider/Extender: [name] in Treatment: 5 History of Present Illness HPI Description: 05-22-2022 upon evaluation today patient appears to be having issues with the wound on her right heel. This actually occurred during a surgical intervention for her left knee she had a knee replacement. She went in with no issues and when she got home or even while she was staying overnight before being discharged home noted to have an issue with a discolored area on her right heel. She is unsure how this happened but knows it was not present prior to the surgery. She has been monitoring since it is now an eschar covered region that obviously is going to need to be removed in order to allow this to heal. With that being said her ABIs were not registering very well NuShield today it appears that she may have a issue with good arterial flow this could be somewhat accounting for what is going on with the heel. Nonetheless I do think she is probably going to be seen by a vein and vascular for arterial studies. She had the  surgery on 2022/07/26 and came home on a Saturday. This was a very short stay. Patient does have a history of rheumatoid arthritis but really no other major medical problems. 05-29-2022 upon evaluation today patient appears to be doing well currently in regard to her wound. In fact she is  telling me that she is doing quite well at this point. Fortunately there does not appear to be any signs of active infection locally nor systemically at this time which is great news. No fevers, chills, nausea, vomiting, or diarrhea. The good news that she did get the Santyl although it took until Friday. 06-15-2022 upon evaluation today patient appears to be doing well currently in regard to her heel ulcer. We are slowly getting this to loosen up and clearway. Fortunately I do not see any signs of active infection at this time which is great news I think that we are definitely ready to clear away some of the necrotic debris today. 06-20-2022 upon evaluation today patient's wound is actually showing signs of good improvement with regard to the heel. She has been tolerating the dressing changes without complication and currently I am going to recommend that we go ahead and continue with the plan using the Santyl to help clean up the surface of this wound. Really she seems to be doing quite well with that. 06-27-2022 upon evaluation patient actually appears to be doing better she is slowly showing signs of improvement. With that being said the wound is a bit deeper but this is only because we are getting to the base of the wound as far as cleaning out the necrotic tissue is concerned. Fortunately I think the Santyl is loosening things up so we can actually do this. I think we are going to switch to using Dakin's moistened gauze over top of the Santyl as of today. Electronic Signature(s) Signed: 06/27/2022 1:56:37 PM By: Allen Derry PA-C Entered By: Allen Derry on 06/27/2022 13:56:37 [name] ([id number])  [id number] Page 3 of 7 -------------------------------------------------------------------------------- Physical Exam Details Patient Name: Date of Service: [name], [name] 06/27/2022 1:15 PM Medical Record Number: [id number] Patient Account Number: [id number] Date of Birth/Sex: Treating RN: 05/26/1933 (89 y.o. [name] Primary Care Provider: Bethann Punches Other Clinician: Referring Provider: Treating Provider/Extender: [name] in Treatment: 5 Constitutional Well-nourished and well-hydrated in no acute distress. Respiratory normal breathing without difficulty. Psychiatric this patient is able to make decisions and demonstrates good insight into disease process. Alert and Oriented x 3. pleasant and cooperative. Notes Upon inspection patient's wound bed actually showed signs of good granulation epithelization at this point. Fortunately I do not see any evidence of worsening I think the Melburn Popper is doing his job I am going to try to debride out what I can do today and she is in agreement with that plan. Postdebridement this actually looks much better although it is not completely clean that we are definitely getting to the base of the wound where there is good healthy tissue. Nonetheless this is still not completely clean yet and I would recommend switching to the Dakin's moistened gauze over top of the Santyl to help move this along. Electronic Signature(s) Signed: 06/27/2022 1:57:10 PM By: Allen Derry PA-C Entered By: Allen Derry on 06/27/2022 13:57:10 -------------------------------------------------------------------------------- Physician Orders Details Patient Name: Date of Service: [name], [name] 06/27/2022 1:15 PM Medical Record Number: [id number] Patient Account Number: [id number] Date of Birth/Sex: Treating RN: 1933/11/21 (89 y.o. [name] Primary Care Provider: Bethann Punches Other Clinician: Referring  Provider: Treating Provider/Extender: [name] in Treatment: 5 Verbal / Phone Orders: No Diagnosis Coding ICD-10 Coding Code Description L89.610 Pressure ulcer of right heel, unstageable M05.80 Other rheumatoid arthritis with rheumatoid factor of unspecified site Follow-up Appointments Return Appointment in  1 week. CORTNEE, STEINMILLER ([id number]) [id number] Page 4 of 7 Bathing/ [employer] wounds with antibacterial soap and water. Doylene Canning dial Anesthetic (Use 'Patient Medications' Section for Anesthetic Order Entry) Lidocaine applied to wound bed Wound Treatment Wound #1 - Calcaneus Wound Laterality: Right Cleanser: Byram Ancillary Kit - 15 Day Supply 1 x Per Day/30 Days Discharge Instructions: Use supplies as instructed; Kit contains: (15) Saline Bullets; (15) 3x3 Gauze; 15 pr Gloves Cleanser: Dakin 16 (oz) 0.25 1 x Per Day/30 Days Discharge Instructions: Use as directed. Cleanser: Soap and Water 1 x Per Day/30 Days Discharge Instructions: Gently cleanse wound with antibacterial soap, rinse and pat dry prior to dressing wounds Topical: Santyl Collagenase Ointment, 30 (gm), tube 1 x Per Day/30 Days Discharge Instructions: apply nickel thick to wound bed only Prim Dressing: Gauze (Generic) 1 x Per Day/30 Days ary Discharge Instructions: moistened with saline Secondary Dressing: (BORDER) Zetuvit Plus SILICONE BORDER Dressing 4x4 (in/in) (Generic) 1 x Per Day/30 Days Discharge Instructions: Please do not put silicone bordered dressings under wraps. Use non-bordered dressing only. Electronic Signature(s) Signed: 06/29/2022 4:40:27 PM By: Midge Aver MSN RN CNS WTA Signed: 06/29/2022 5:52:14 PM By: Allen Derry PA-C Entered By: Midge Aver on 06/27/2022 13:58:02 -------------------------------------------------------------------------------- Problem List Details Patient Name: Date of Service: [name], [name] 06/27/2022 1:15  PM Medical Record Number: [id number] Patient Account Number: [id number] Date of Birth/Sex: Treating RN: May 28, 1933 (89 y.o. [name] Primary Care Provider: Bethann Punches Other Clinician: Referring Provider: Treating Provider/Extender: [name] in Treatment: 5 Active Problems ICD-10 Encounter Code Description Active Date MDM Diagnosis L89.610 Pressure ulcer of right heel, unstageable 05/22/2022 No Yes M05.80 Other rheumatoid arthritis with rheumatoid factor of unspecified site 05/22/2022 No Yes Inactive Problems Resolved Problems [name], [name] ([id number]) [id number] Page 5 of 7 Electronic Signature(s) Signed: 06/29/2022 4:40:27 PM By: Midge Aver MSN RN CNS WTA Signed: 06/29/2022 5:52:14 PM By: Allen Derry PA-C Previous Signature: 06/27/2022 1:38:47 PM Version By: Allen Derry PA-C Entered By: Midge Aver on 06/27/2022 14:09:44 -------------------------------------------------------------------------------- Progress Note Details Patient Name: Date of Service: [name], [name] 06/27/2022 1:15 PM Medical Record Number: [id number] Patient Account Number: [id number] Date of Birth/Sex: Treating RN: [birth year] (89 y.o. [name] Primary Care Provider: Bethann Punches Other Clinician: Referring Provider: Treating Provider/Extender: [name] in Treatment: 5 Subjective Chief Complaint Information obtained from Patient Right heel pressure ulcer History of Present Illness (HPI) 05-22-2022 upon evaluation today patient appears to be having issues with the wound on her right heel. This actually occurred during a surgical intervention for her left knee she had a knee replacement. She went in with no issues and when she got home or even while she was staying overnight before being discharged home noted to have an issue with a discolored area on her right heel. She is unsure how this happened but knows it was not  present prior to the surgery. She has been monitoring since it is now an eschar covered region that obviously is going to need to be removed in order to allow this to heal. With that being said her ABIs were not registering very well NuShield today it appears that she may have a issue with good arterial flow this could be somewhat accounting for what is going on with the heel. Nonetheless I do think she is probably going to be seen by a vein and vascular for arterial studies. She had the surgery on Friday and  came home on a Saturday. This was a very short stay. Patient does have a history of rheumatoid arthritis but really no other major medical problems. 05-29-2022 upon evaluation today patient appears to be doing well currently in regard to her wound. In fact she is telling me that she is doing quite well at this point. Fortunately there does not appear to be any signs of active infection locally nor systemically at this time which is great news. No fevers, chills, nausea, vomiting, or diarrhea. The good news that she did get the Santyl although it took until Friday. 06-15-2022 upon evaluation today patient appears to be doing well currently in regard to her heel ulcer. We are slowly getting this to loosen up and clearway. Fortunately I do not see any signs of active infection at this time which is great news I think that we are definitely ready to clear away some of the necrotic debris today. 06-20-2022 upon evaluation today patient's wound is actually showing signs of good improvement with regard to the heel. She has been tolerating the dressing changes without complication and currently I am going to recommend that we go ahead and continue with the plan using the Santyl to help clean up the surface of this wound. Really she seems to be doing quite well with that. 06-27-2022 upon evaluation patient actually appears to be doing better she is slowly showing signs of improvement. With that being said the  wound is a bit deeper but this is only because we are getting to the base of the wound as far as cleaning out the necrotic tissue is concerned. Fortunately I think the Santyl is loosening things up so we can actually do this. I think we are going to switch to using Dakin's moistened gauze over top of the Santyl as of today. Objective Constitutional Well-nourished and well-hydrated in no acute distress. Vitals Time Taken: 1:34 PM, Height: 62 in, Weight: 118 lbs, BMI: 21.6, Temperature: 97.5 F, Pulse: 94 bpm, Respiratory Rate: 18 breaths/min, Blood Pressure: 143/85 mmHg. Respiratory normal breathing without difficulty. Psychiatric [name], [name] ([id number]) [id number] Page 6 of 7 this patient is able to make decisions and demonstrates good insight into disease process. Alert and Oriented x 3. pleasant and cooperative. General Notes: Upon inspection patient's wound bed actually showed signs of good granulation epithelization at this point. Fortunately I do not see any evidence of worsening I think the Melburn Popper is doing his job I am going to try to debride out what I can do today and she is in agreement with that plan. Postdebridement this actually looks much better although it is not completely clean that we are definitely getting to the base of the wound where there is good healthy tissue. Nonetheless this is still not completely clean yet and I would recommend switching to the Dakin's moistened gauze over top of the Santyl to help move this along. Integumentary (Hair, Skin) Wound #1 status is Open. Original cause of wound was Pressure Injury. The date acquired was: 03/25/2022. The wound has been in treatment 5 weeks. The wound is located on the Right Calcaneus. The wound measures 1.5cm length x 1.8cm width x 0.3cm depth; 2.121cm^2 area and 0.636cm^3 volume. There is a medium amount of serosanguineous drainage noted. There is no granulation within the wound bed. There is  a large (67-100%) amount of necrotic tissue within the wound bed including Adherent Slough. Assessment Active Problems ICD-10 Pressure ulcer of right heel, unstageable Other rheumatoid arthritis with rheumatoid factor of  unspecified site Procedures Wound #1 Pre-procedure diagnosis of Wound #1 is a Pressure Ulcer located on the Right Calcaneus . There was a Excisional Skin/Subcutaneous Tissue Debridement with a total area of 2.12 sq cm performed by Allen Derry, PA-C. With the following instrument(s): Curette to remove Viable and Non-Viable tissue/material. Material removed includes Subcutaneous Tissue and Slough and after achieving pain control using Lidocaine 4% T opical Solution. No specimens were taken. A time out was conducted at 13:47, prior to the start of the procedure. A Minimum amount of bleeding was controlled with Pressure. The procedure was tolerated well with a pain level of 0 throughout and a pain level of 0 following the procedure. Post Debridement Measurements: 1.5cm length x 1.8cm width x 0.4cm depth; 0.848cm^3 volume. Post debridement Stage noted as Unstageable/Unclassified. Character of Wound/Ulcer Post Debridement is stable. Post procedure Diagnosis Wound #1: Same as Pre-Procedure Plan Follow-up Appointments: Return Appointment in 1 week. Bathing/ Shower/ Hygiene: Wash wounds with antibacterial soap and water. Doylene Canning dial Anesthetic (Use 'Patient Medications' Section for Anesthetic Order Entry): Lidocaine applied to wound bed WOUND #1: - Calcaneus Wound Laterality: Right Cleanser: Byram Ancillary Kit - 15 Day Supply (Generic) 1 x Per Day/30 Days Discharge Instructions: Use supplies as instructed; Kit contains: (15) Saline Bullets; (15) 3x3 Gauze; 15 pr Gloves Cleanser: Dakin 16 (oz) 0.25 1 x Per Day/30 Days Discharge Instructions: Use as directed. Cleanser: Soap and Water 1 x Per Day/30 Days Discharge Instructions: Gently cleanse wound with antibacterial soap, rinse  and pat dry prior to dressing wounds Topical: Santyl Collagenase Ointment, 30 (gm), tube 1 x Per Day/30 Days Discharge Instructions: apply nickel thick to wound bed only Prim Dressing: Gauze (Generic) 1 x Per Day/30 Days ary Discharge Instructions: moistened with saline Secondary Dressing: (BORDER) Zetuvit Plus SILICONE BORDER Dressing 4x4 (in/in) (Generic) 1 x Per Day/30 Days Discharge Instructions: Please do not put silicone bordered dressings under wraps. Use non-bordered dressing only. 1. I would recommend that we have the patient continue to monitor for any signs of infection or worsening. Based on what I am seeing I do think that she is making really good progress towards healing using Santyl and following this we will be using the Dakin's moistened gauze and behind. 2. I am good recommend as well that the patient should continue to monitor for any evidence of infection or worsening. Overall I do believe that work headed in the appropriate direction here. 3. With regard to timing till complete healing again we have not gotten to the base of the wound and gotten this completely cleaned as of yet this is still working progress. Nonetheless I do believe each visit were getting closer and closer to that point. I am hopeful that the Dakin's moistened gauze and behind the Santyl will help with that while at the same time helping to keep bacterial load down. We will see patient back for reevaluation in 1 week here in the clinic. If anything worsens or changes patient will contact our office for additional recommendations. Electronic Signature(s) Signed: 06/27/2022 1:58:08 PM By: Wendall Papa, Lewis Moccasin ([id number]) [id number] Page 7 of 7 Signed: 06/27/2022 1:58:08 PM By: Allen Derry PA-C Entered By: Allen Derry on 06/27/2022 13:58:08 -------------------------------------------------------------------------------- SuperBill Details Patient Name: Date of  Service: [name], [name] 06/27/2022 Medical Record Number: [id number] Patient Account Number: [id number] Date of Birth/Sex: Treating RN: 1933/09/23 (89 y.o. [name] Primary Care Provider: Bethann Punches Other Clinician: Referring Provider: Treating Provider/Extender: Janith Lima  Weeks in Treatment: 5 Diagnosis Coding ICD-10 Codes Code Description L89.610 Pressure ulcer of right heel, unstageable M05.80 Other rheumatoid arthritis with rheumatoid factor of unspecified site Facility Procedures : CPT4 Code: [id number] Description: 11042 - DEB SUBQ TISSUE 20 SQ CM/< ICD-10 Diagnosis Description L89.610 Pressure ulcer of right heel, unstageable Modifier: Quantity: 1 Physician Procedures : CPT4 Code Description Modifier [id number] 11042 - WC PHYS SUBQ TISS 20 SQ CM ICD-10 Diagnosis Description L89.610 Pressure ulcer of right heel, unstageable Quantity: 1 Electronic Signature(s) Signed: 06/27/2022 1:58:15 PM By: Allen Derry PA-C Entered By: Allen Derry on 06/27/2022 13:58:15

## 2022-06-29 NOTE — Progress Notes (Signed)
Monica, Robertson ([id number]) [id number] Page 1 of 8 Visit Report for 06/27/2022 Arrival Information Details Patient Name: Date of Service: [facility], [name] 06/27/2022 1:15 PM Medical Record Number: [id number] Patient Account Number: [id number] Date of Birth/Sex: Treating RN: 11/04/1933 (89 y.o. [name] Primary Care Ezrie Bunyan: Bethann Punches Other Clinician: Referring Milka Windholz: Treating Keeton Kassebaum/Extender: [name] in Treatment: 5 Visit Information History Since Last Visit Added or deleted any medications: No Patient Arrived: Ambulatory Has Dressing in Place as Prescribed: Yes Arrival Time: 13:29 Pain Present Now: No Accompanied By: sister Transfer Assistance: None Patient Identification Verified: Yes Secondary Verification Process Completed: Yes Patient Requires Transmission-Based Precautions: No Patient Has Alerts: Yes Patient Alerts: NOT Diabetic PAD Bilateral ABI L1.08TBI .70 05/25/22 ABI R .97 TBI .70 05/25/22 Electronic Signature(s) Signed: 06/29/2022 4:40:27 PM By: Midge Aver MSN RN CNS WTA Entered By: Midge Aver on 06/27/2022 13:34:08 -------------------------------------------------------------------------------- Encounter Discharge Information Details Patient Name: Date of Service: [name], [name] 06/27/2022 1:15 PM Medical Record Number: [id number] Patient Account Number: [id number] Date of Birth/Sex: Treating RN: 1933/04/27 (89 y.o. [name] Primary Care Alistair Senft: Bethann Punches Other Clinician: Referring Tanya Marvin: Treating Jeriko Kowalke/Extender: [name] in Treatment: 5 Encounter Discharge Information Items Post Procedure Vitals Discharge Condition: Stable Temperature (F): 97.5 Ambulatory Status: Ambulatory Pulse (bpm): 94 Discharge Destination: Home Respiratory Rate (breaths/min): 18 Transportation: Private Auto Blood Pressure (mmHg): 143/85 Accompanied By: sister Schedule Follow-up  Appointment: Yes Clinical Summary of CareSHANEIA, [name] ([id number]) [id number] Page 2 of 8 Electronic Signature(s) Signed: 06/29/2022 4:40:27 PM By: Midge Aver MSN RN CNS WTA Entered By: Midge Aver on 06/27/2022 14:10:13 -------------------------------------------------------------------------------- Lower Extremity Assessment Details Patient Name: Date of Service: [facility], [name] 06/27/2022 1:15 PM Medical Record Number: [id number] Patient Account Number: [id number] Date of Birth/Sex: Treating RN: February 24, 1933 (89 y.o. [name] Primary Care Jewel Venditto: Bethann Punches Other Clinician: Referring Espyn Radwan: Treating Ricca Melgarejo/Extender: [name] in Treatment: 5 Edema Assessment Assessed: [Left: No] [Right: Yes] [Left: Edema] [Right: :] Calf Left: Right: Point of Measurement: 32 cm From Medial Instep 32 cm Ankle Left: Right: Point of Measurement: 10 cm From Medial Instep 21 cm Vascular Assessment Pulses: Dorsalis Pedis Palpable: [Right:Yes] Electronic Signature(s) Signed: 06/29/2022 4:40:27 PM By: Midge Aver MSN RN CNS WTA Entered By: Midge Aver on 06/27/2022 13:42:20 -------------------------------------------------------------------------------- Multi Wound Chart Details Patient Name: Date of Service: [name], [name] 06/27/2022 1:15 PM Medical Record Number: [id number] Patient Account Number: [id number] Date of Birth/Sex: Treating RN: Jul 01, 1933 (89 y.o. [name] Primary Care Tauno Falotico: Bethann Punches Other Clinician: Referring Augusto Deckman: Treating Ceyda Peterka/Extender: [name] in Treatment: 5 Vital Signs Monica, Robertson ([id number]) [id number] Page 3 of 8 Height(in): 62 Pulse(bpm): 94 Weight(lbs): 118 Blood Pressure(mmHg): 143/85 Body Mass Index(BMI): 21.6 Temperature(F): 97.5 Respiratory Rate(breaths/min): 18 [1:Photos:] [N/A:N/A] Right Calcaneus N/A N/A Wound  Location: Pressure Injury N/A N/A Wounding Event: Pressure Ulcer N/A N/A Primary Etiology: Hypertension, Rheumatoid Arthritis N/A N/A Comorbid History: 03/25/2022 N/A N/A Date Acquired: 5 N/A N/A Weeks of Treatment: Open N/A N/A Wound Status: No N/A N/A Wound Recurrence: 1.5x1.8x0.3 N/A N/A Measurements L x W x D (cm) 2.121 N/A N/A A (cm) : rea 0.636 N/A N/A Volume (cm) : -5.90% N/A N/A % Reduction in A rea: -218.00% N/A N/A % Reduction in Volume: Unstageable/Unclassified N/A N/A Classification: Medium N/A N/A Exudate A mount: Serosanguineous N/A N/A Exudate Type: red, brown N/A N/A Exudate Color: None  Present (0%) N/A N/A Granulation A mount: Large (67-100%) N/A N/A Necrotic A mount: Eschar, Adherent Slough N/A N/A Necrotic Tissue: Fascia: No N/A N/A Exposed Structures: Fat Layer (Subcutaneous Tissue): No Tendon: No Muscle: No Joint: No Bone: No None N/A N/A Epithelialization: Treatment Notes Electronic Signature(s) Signed: 06/29/2022 4:40:27 PM By: Midge Aver MSN RN CNS WTA Entered By: Midge Aver on 06/27/2022 13:43:51 -------------------------------------------------------------------------------- Multi-Disciplinary Care Plan Details Patient Name: Date of Service: [name], [name] 06/27/2022 1:15 PM Medical Record Number: [id number] Patient Account Number: [id number] Date of Birth/Sex: Treating RN: 06-20-1933 (89 y.o. [name] Primary Care Masa Lubin: Bethann Punches Other Clinician: Referring Rola Lennon: Treating Rydan Gulyas/Extender: [name] in Treatment: [address] ASHYA, Robertson ([id number]) [id number] Page 4 of 8 Necrotic Tissue Nursing Diagnoses: Impaired tissue integrity related to necrotic/devitalized tissue Knowledge deficit related to management of necrotic/devitalized tissue Goals: Necrotic/devitalized tissue will be minimized in the wound bed Date Initiated: 05/22/2022 Date  Inactivated: 06/27/2022 Target Resolution Date: 06/22/2022 Goal Status: Met Patient/caregiver will verbalize understanding of reason and process for debridement of necrotic tissue Date Initiated: 05/22/2022 Target Resolution Date: 06/22/2022 Goal Status: Active Interventions: Assess patient pain level pre-, during and post procedure and prior to discharge Provide education on necrotic tissue and debridement process Treatment Activities: Apply topical anesthetic as ordered : 05/22/2022 Enzymatic debridement : 05/22/2022 Notes: Wound/Skin Impairment Nursing Diagnoses: Impaired tissue integrity Knowledge deficit related to ulceration/compromised skin integrity Goals: Patient/caregiver will verbalize understanding of skin care regimen Date Initiated: 05/22/2022 Date Inactivated: 06/27/2022 Target Resolution Date: 06/22/2022 Goal Status: Met Ulcer/skin breakdown will have a volume reduction of 30% by week 4 Date Initiated: 05/22/2022 Date Inactivated: 06/27/2022 Target Resolution Date: 06/22/2022 Goal Status: Met Ulcer/skin breakdown will have a volume reduction of 50% by week 8 Date Initiated: 05/22/2022 Target Resolution Date: 07/22/2022 Goal Status: Active Ulcer/skin breakdown will have a volume reduction of 80% by week 12 Date Initiated: 05/22/2022 Target Resolution Date: 08/22/2022 Goal Status: Active Ulcer/skin breakdown will heal within 14 weeks Date Initiated: 05/22/2022 Target Resolution Date: 09/05/2022 Goal Status: Active Interventions: Assess patient/caregiver ability to obtain necessary supplies Assess patient/caregiver ability to perform ulcer/skin care regimen upon admission and as needed Assess ulceration(s) every visit Provide education on ulcer and skin care Treatment Activities: Referred to DME Loreta Blouch for dressing supplies : 05/22/2022 Skin care regimen initiated : 05/22/2022 Topical wound management initiated : 05/22/2022 Notes: Electronic Signature(s) Signed:  06/29/2022 4:40:27 PM By: Midge Aver MSN RN CNS WTA Entered By: Midge Aver on 06/27/2022 14:09:13 April [employer] ([id number]) [id number] Page 5 of 8 -------------------------------------------------------------------------------- Pain Assessment Details Patient Name: Date of Service: [name], [name] 06/27/2022 1:15 PM Medical Record Number: [id number] Patient Account Number: [id number] Date of Birth/Sex: Treating RN: 1933/10/26 (89 y.o. [name] Primary Care Jaeley Wiker: Bethann Punches Other Clinician: Referring Rodriquez Thorner: Treating Tallie Hevia/Extender: [name] in Treatment: 5 Active Problems Location of Pain Severity and Description of Pain Patient Has Paino No Site Locations Pain Management and Medication Current Pain Management: Electronic Signature(s) Signed: 06/29/2022 4:40:27 PM By: Midge Aver MSN RN CNS WTA Entered By: Midge Aver on 06/27/2022 13:37:10 -------------------------------------------------------------------------------- Patient/Caregiver Education Details Patient Name: Date of Service: [name], [name] 6/18/2024andnbsp1:15 PM Medical Record Number: [id number] Patient Account Number: [id number] Date of Birth/Gender: Treating RN: 07/09/1933 (89 y.o. [name] Primary Care Physician: Bethann Punches Other Clinician: Referring Physician: Treating Physician/Extender: [name] in Treatment: [address] [place], [place] J ([id number]) [id number]  Page 6 of 8 Education Assessment Education Provided To: Patient Education Topics Provided Wound Debridement: Handouts: Wound Debridement Methods: Explain/Verbal Responses: State content correctly Wound/Skin Impairment: Handouts: Caring for Your Ulcer Methods: Explain/Verbal Responses: State content correctly Electronic Signature(s) Signed: 06/29/2022 4:40:27 PM By: Midge Aver MSN RN CNS WTA Entered By: Midge Aver on  06/27/2022 14:09:30 -------------------------------------------------------------------------------- Wound Assessment Details Patient Name: Date of Service: [name], [name] 06/27/2022 1:15 PM Medical Record Number: [id number] Patient Account Number: [id number] Date of Birth/Sex: Treating RN: 1933-07-26 (89 y.o. [name] Primary Care Ahmad Vanwey: Bethann Punches Other Clinician: Referring Larene Ascencio: Treating Welcome Fults/Extender: [name] in Treatment: 5 Wound Status Wound Number: 1 Primary Etiology: Pressure Ulcer Wound Location: Right Calcaneus Wound Status: Open Wounding Event: Pressure Injury Comorbid History: Hypertension, Rheumatoid Arthritis Date Acquired: 03/25/2022 Weeks Of Treatment: 5 Clustered Wound: No Photos Wound Measurements Length: (cm) 1.5 Width: (cm) 1.8 Depth: (cm) 0.3 Area: (cm) 2.121 Volume: (cm) 0.636 Omeara, Aela J ([id number]) Wound Description Classification: Category/Stage IV Exudate Amount: Medium Exudate Type: Serosanguineous Exudate Color: red, brown Foul Odor After Cleansing: Slough/Fibrino % Reduction in Area: -5.9% % Reduction in Volume: -218% Epithelialization: None [id number] Page 7 of 8 No Yes Wound Bed Granulation Amount: None Present (0%) Exposed Structure Necrotic Amount: Large (67-100%) Fascia Exposed: No Necrotic Quality: Adherent Slough Fat Layer (Subcutaneous Tissue) Exposed: No Tendon Exposed: No Muscle Exposed: No Joint Exposed: No Bone Exposed: No Treatment Notes Wound #1 (Calcaneus) Wound Laterality: Right Cleanser Byram Ancillary Kit - 15 Day Supply Discharge Instruction: Use supplies as instructed; Kit contains: (15) Saline Bullets; (15) 3x3 Gauze; 15 pr Gloves Dakin 16 (oz) 0.25 Discharge Instruction: Use as directed. Soap and Water Discharge Instruction: Gently cleanse wound with antibacterial soap, rinse and pat dry prior to dressing wounds Peri-Wound  Care Topical Santyl Collagenase Ointment, 30 (gm), tube Discharge Instruction: apply nickel thick to wound bed only Primary Dressing Gauze Discharge Instruction: moistened with saline Secondary Dressing (BORDER) Zetuvit Plus SILICONE BORDER Dressing 4x4 (in/in) Discharge Instruction: Please do not put silicone bordered dressings under wraps. Use non-bordered dressing only. Secured With Compression Wrap Compression Stockings Facilities manager) Signed: 06/29/2022 4:40:27 PM By: Midge Aver MSN RN CNS WTA Entered By: Midge Aver on 06/27/2022 13:53:03 -------------------------------------------------------------------------------- Vitals Details Patient Name: Date of Service: [name], [name] 06/27/2022 1:15 PM Medical Record Number: [id number] Patient Account Number: [id number] Date of Birth/Sex: Treating RN: Jun 21, 1933 (89 y.o. [name] Primary Care Angalena Cousineau: Bethann Punches Other Clinician: Referring Roran Wegner: Treating Elaina Cara/Extender: [name] in Treatment: 5 [place], [place] J ([id number]) [id number] Page 8 of 8 Vital Signs Time Taken: 13:34 Temperature (F): 97.5 Height (in): 62 Pulse (bpm): 94 Weight (lbs): 118 Respiratory Rate (breaths/min): 18 Body Mass Index (BMI): 21.6 Blood Pressure (mmHg): 143/85 Reference Range: 80 - 120 mg / dl Electronic Signature(s) Signed: 06/29/2022 4:40:27 PM By: Midge Aver MSN RN CNS WTA Entered By: Midge Aver on 06/27/2022 13:37:03

## 2022-07-04 ENCOUNTER — Encounter: Payer: Medicare Other | Admitting: Physician Assistant

## 2022-07-04 DIAGNOSIS — L89610 Pressure ulcer of right heel, unstageable: Secondary | ICD-10-CM | POA: Diagnosis not present

## 2022-07-04 NOTE — Progress Notes (Addendum)
[name], [name] ([id number]) [id number] Page 1 of 7 Visit Report for 07/04/2022 Chief Complaint Document Details Patient Name: Date of Service: [facility], Monica RMA J. 07/04/2022 1:15 PM Medical Record Number: [id number] Patient Account Number: [id number] Date of Birth/Sex: Treating RN: [birth year] (89 y.o. [name] Primary Care Provider: Bethann Punches Other Clinician: Referring Provider: Treating Provider/Extender: [name] in Treatment: 6 Information Obtained from: Patient Chief Complaint Right heel pressure ulcer Electronic Signature(s) Signed: 07/04/2022 1:04:22 PM By: Allen Derry PA-C Entered By: Allen Derry on 07/04/2022 13:04:22 -------------------------------------------------------------------------------- Debridement Details Patient Name: Date of Service: [name], Monica RMA J. 07/04/2022 1:15 PM Medical Record Number: [id number] Patient Account Number: [id number] Date of Birth/Sex: Treating RN: 08/23/1933 (89 y.o. [name] Primary Care Provider: Bethann Punches Other Clinician: Referring Provider: Treating Provider/Extender: [name] in Treatment: 6 Debridement Performed for Assessment: Wound #1 Right Calcaneus Performed By: Physician Allen Derry, PA-C Debridement Type: Debridement Level of Consciousness (Pre-procedure): Awake and Alert Pre-procedure Verification/Time Out Yes - 14:21 Taken: Start Time: 14:21 Pain Control: Lidocaine 4% T opical Solution Percent of Wound Bed Debrided: 100% T Area Debrided (cm): otal 3.53 Tissue and other material debrided: Viable, Non-Viable, Slough, Subcutaneous, Slough Level: Skin/Subcutaneous Tissue Debridement Description: Excisional Instrument: Curette Bleeding: Moderate Hemostasis Achieved: Pressure Procedural Pain: 3 Post Procedural Pain: 3 Response to Treatment: Procedure was tolerated well [name], [name] ([id number]) [id number]  Page 2 of 7 Level of Consciousness (Post- Awake and Alert procedure): Post Debridement Measurements of Total Wound Length: (cm) 1.8 Stage: Category/Stage IV Width: (cm) 2.5 Depth: (cm) 0.8 Volume: (cm) 2.827 Character of Wound/Ulcer Post Debridement: Stable Post Procedure Diagnosis Same as Pre-procedure Electronic Signature(s) Signed: 07/04/2022 2:41:28 PM By: Monica Aver MSN RN CNS WTA Signed: 07/04/2022 5:11:00 PM By: Allen Derry PA-C Entered By: [name] on 07/04/2022 14:22:20 -------------------------------------------------------------------------------- HPI Details Patient Name: Date of Service: [name], Monica RMA J. 07/04/2022 1:15 PM Medical Record Number: [id number] Patient Account Number: [id number] Date of Birth/Sex: Treating RN: 05/22/1933 (89 y.o. [name] Primary Care Provider: Bethann Punches Other Clinician: Referring Provider: Treating Provider/Extender: [name] in Treatment: 6 History of Present Illness HPI Description: 05-22-2022 upon evaluation today patient appears to be having issues with the wound on her right heel. This actually occurred during a surgical intervention for her left knee she had a knee replacement. She went in with Monica issues and when she got home or even while she was staying overnight before being discharged home noted to have an issue with a discolored area on her right heel. She is unsure how this happened but knows it was not present prior to the surgery. She has been monitoring since it is now an eschar covered region that obviously is going to need to be removed in order to allow this to heal. With that being said her ABIs were not registering very well NuShield today it appears that she may have a issue with good arterial flow this could be somewhat accounting for what is going on with the heel. Nonetheless I do think she is probably going to be seen by a vein and vascular for arterial studies. She had the surgery  on Aug 17, 2022 and came home on a Saturday. This was a very short stay. Patient does have a history of rheumatoid arthritis but really Monica other major medical problems. 05-29-2022 upon evaluation today patient appears to be doing well currently in regard to her wound. In fact she  is telling me that she is doing quite well at this point. Fortunately there does not appear to be any signs of active infection locally nor systemically at this time which is great news. Monica fevers, chills, nausea, vomiting, or diarrhea. The good news that she did get the Santyl although it took until Friday. 06-15-2022 upon evaluation today patient appears to be doing well currently in regard to her heel ulcer. We are slowly getting this to loosen up and clearway. Fortunately I do not see any signs of active infection at this time which is great news I think that we are definitely ready to clear away some of the necrotic debris today. 06-20-2022 upon evaluation today patient's wound is actually showing signs of good improvement with regard to the heel. She has been tolerating the dressing changes without complication and currently I am going to recommend that we go ahead and continue with the plan using the Santyl to help clean up the surface of this wound. Really she seems to be doing quite well with that. 06-27-2022 upon evaluation patient actually appears to be doing better she is slowly showing signs of improvement. With that being said the wound is a bit deeper but this is only because we are getting to the base of the wound as far as cleaning out the necrotic tissue is concerned. Fortunately I think the Santyl is loosening things up so we can actually do this. I think we are going to switch to using Dakin's moistened gauze over top of the Santyl as of today. 07-04-2022 upon evaluation today patient appears to be doing well currently in regard to her wound. She has been tolerating the dressing changes without complication.  Fortunately I do not see any signs of active infection at this time which is great news and in general I do believe that removing in the appropriate direction here. She is going to require some debridement today. Electronic Signature(s) Signed: 07/04/2022 2:26:42 PM By: Allen Derry PA-C Entered By: Allen Derry on 07/04/2022 14:26:42 [name] ([id number]) [id number] Page 3 of 7 -------------------------------------------------------------------------------- Physical Exam Details Patient Name: Date of Service: [name], Monica RMA J. 07/04/2022 1:15 PM Medical Record Number: [id number] Patient Account Number: [id number] Date of Birth/Sex: Treating RN: October 18, 1933 (89 y.o. [name] Primary Care Provider: Bethann Punches Other Clinician: Referring Provider: Treating Provider/Extender: [name] in Treatment: 6 Constitutional Well-nourished and well-hydrated in Monica acute distress. Respiratory normal breathing without difficulty. Psychiatric this patient is able to make decisions and demonstrates good insight into disease process. Alert and Oriented x 3. pleasant and cooperative. Notes Upon inspection patient's wound bed showed evidence of good granulation and epithelization at this point. Fortunately I do not see any signs of active infection which is great news and in general I do believe that we are making good headway towards closure. I did perform debridement clearway necrotic debris she tolerated that without complication postdebridement wound bed appears to be much better. Electronic Signature(s) Signed: 07/04/2022 2:27:03 PM By: Allen Derry PA-C Entered By: Allen Derry on 07/04/2022 14:27:03 -------------------------------------------------------------------------------- Physician Orders Details Patient Name: Date of Service: [name], Monica RMA J. 07/04/2022 1:15 PM Medical Record Number: [id number] Patient Account Number: [id number] Date  of Birth/Sex: Treating RN: Aug 24, 1933 (89 y.o. [name] Primary Care Provider: Bethann Punches Other Clinician: Referring Provider: Treating Provider/Extender: [name] in Treatment: 6 Verbal / Phone Orders: Monica Diagnosis Coding ICD-10 Coding Code Description L89.610 Pressure ulcer of right  heel, unstageable M05.80 Other rheumatoid arthritis with rheumatoid factor of unspecified site Follow-up Appointments Return Appointment in 1 week. [name], [name] ([id number]) [id number] Page 4 of 7 Bathing/ [employer] wounds with antibacterial soap and water. Doylene Canning dial Anesthetic (Use 'Patient Medications' Section for Anesthetic Order Entry) Lidocaine applied to wound bed Wound Treatment Wound #1 - Calcaneus Wound Laterality: Right Cleanser: Byram Ancillary Kit - 15 Day Supply 1 x Per Day/30 Days Discharge Instructions: Use supplies as instructed; Kit contains: (15) Saline Bullets; (15) 3x3 Gauze; 15 pr Gloves Cleanser: Dakin 16 (oz) 0.25 1 x Per Day/30 Days Discharge Instructions: Use as directed. Cleanser: Soap and Water 1 x Per Day/30 Days Discharge Instructions: Gently cleanse wound with antibacterial soap, rinse and pat dry prior to dressing wounds Topical: Santyl Collagenase Ointment, 30 (gm), tube 1 x Per Day/30 Days Discharge Instructions: apply nickel thick to wound bed only Prim Dressing: Gauze (Generic) 1 x Per Day/30 Days ary Discharge Instructions: moistened with saline Secondary Dressing: (BORDER) Zetuvit Plus SILICONE BORDER Dressing 4x4 (in/in) (Generic) 1 x Per Day/30 Days Discharge Instructions: Please do not put silicone bordered dressings under wraps. Use non-bordered dressing only. Electronic Signature(s) Signed: 07/04/2022 2:41:28 PM By: Monica Aver MSN RN CNS WTA Signed: 07/04/2022 5:11:00 PM By: Allen Derry PA-C Previous Signature: 07/04/2022 2:10:35 PM Version By: Monica Aver MSN RN CNS WTA Entered By:  [name] on 07/04/2022 14:22:39 -------------------------------------------------------------------------------- Problem List Details Patient Name: Date of Service: [name], Monica RMA J. 07/04/2022 1:15 PM Medical Record Number: [id number] Patient Account Number: [id number] Date of Birth/Sex: Treating RN: 03/01/1933 (89 y.o. [name] Primary Care Provider: Bethann Punches Other Clinician: Referring Provider: Treating Provider/Extender: [name] in Treatment: 6 Active Problems ICD-10 Encounter Code Description Active Date MDM Diagnosis L89.610 Pressure ulcer of right heel, unstageable 05/22/2022 Monica Yes M05.80 Other rheumatoid arthritis with rheumatoid factor of unspecified site 05/22/2022 Monica Yes Inactive Problems Resolved Problems [name], [name] ([id number]) [id number] Page 5 of 7 Electronic Signature(s) Signed: 07/04/2022 1:04:19 PM By: Allen Derry PA-C Entered By: Allen Derry on 07/04/2022 13:04:18 -------------------------------------------------------------------------------- Progress Note Details Patient Name: Date of Service: [name], Monica RMA J. 07/04/2022 1:15 PM Medical Record Number: [id number] Patient Account Number: [id number] Date of Birth/Sex: Treating RN: [birth year] (89 y.o. [name] Primary Care Provider: Bethann Punches Other Clinician: Referring Provider: Treating Provider/Extender: [name] in Treatment: 6 Subjective Chief Complaint Information obtained from Patient Right heel pressure ulcer History of Present Illness (HPI) 05-22-2022 upon evaluation today patient appears to be having issues with the wound on her right heel. This actually occurred during a surgical intervention for her left knee she had a knee replacement. She went in with Monica issues and when she got home or even while she was staying overnight before being discharged home noted to have an issue with a discolored area on her  right heel. She is unsure how this happened but knows it was not present prior to the surgery. She has been monitoring since it is now an eschar covered region that obviously is going to need to be removed in order to allow this to heal. With that being said her ABIs were not registering very well NuShield today it appears that she may have a issue with good arterial flow this could be somewhat accounting for what is going on with the heel. Nonetheless I do think she is probably going to be seen by a vein and vascular for  arterial studies. She had the surgery on Friday and came home on a Saturday. This was a very short stay. Patient does have a history of rheumatoid arthritis but really Monica other major medical problems. 05-29-2022 upon evaluation today patient appears to be doing well currently in regard to her wound. In fact she is telling me that she is doing quite well at this point. Fortunately there does not appear to be any signs of active infection locally nor systemically at this time which is great news. Monica fevers, chills, nausea, vomiting, or diarrhea. The good news that she did get the Santyl although it took until Friday. 06-15-2022 upon evaluation today patient appears to be doing well currently in regard to her heel ulcer. We are slowly getting this to loosen up and clearway. Fortunately I do not see any signs of active infection at this time which is great news I think that we are definitely ready to clear away some of the necrotic debris today. 06-20-2022 upon evaluation today patient's wound is actually showing signs of good improvement with regard to the heel. She has been tolerating the dressing changes without complication and currently I am going to recommend that we go ahead and continue with the plan using the Santyl to help clean up the surface of this wound. Really she seems to be doing quite well with that. 06-27-2022 upon evaluation patient actually appears to be doing better she  is slowly showing signs of improvement. With that being said the wound is a bit deeper but this is only because we are getting to the base of the wound as far as cleaning out the necrotic tissue is concerned. Fortunately I think the Santyl is loosening things up so we can actually do this. I think we are going to switch to using Dakin's moistened gauze over top of the Santyl as of today. 07-04-2022 upon evaluation today patient appears to be doing well currently in regard to her wound. She has been tolerating the dressing changes without complication. Fortunately I do not see any signs of active infection at this time which is great news and in general I do believe that removing in the appropriate direction here. She is going to require some debridement today. Objective Constitutional Well-nourished and well-hydrated in Monica acute distress. Vitals Time Taken: 1:47 PM, Height: 62 in, Weight: 118 lbs, BMI: 21.6, Temperature: 97.8 F, Pulse: 89 bpm, Respiratory Rate: 18 breaths/min, Blood Pressure: 114/67 mmHg. Respiratory normal breathing without difficulty. [name], [name] ([id number]) [id number] Page 6 of 7 Psychiatric this patient is able to make decisions and demonstrates good insight into disease process. Alert and Oriented x 3. pleasant and cooperative. General Notes: Upon inspection patient's wound bed showed evidence of good granulation and epithelization at this point. Fortunately I do not see any signs of active infection which is great news and in general I do believe that we are making good headway towards closure. I did perform debridement clearway necrotic debris she tolerated that without complication postdebridement wound bed appears to be much better. Integumentary (Hair, Skin) Wound #1 status is Open. Original cause of wound was Pressure Injury. The date acquired was: 03/25/2022. The wound has been in treatment 6 weeks. The wound is located on the Right  Calcaneus. The wound measures 1.8cm length x 2.5cm width x 0.8cm depth; 3.534cm^2 area and 2.827cm^3 volume. There is Fat Layer (Subcutaneous Tissue) exposed. There is a medium amount of serosanguineous drainage noted. There is Monica granulation within the wound bed. There  is a large (67-100%) amount of necrotic tissue within the wound bed including Adherent Slough. Assessment Active Problems ICD-10 Pressure ulcer of right heel, unstageable Other rheumatoid arthritis with rheumatoid factor of unspecified site Procedures Wound #1 Pre-procedure diagnosis of Wound #1 is a Pressure Ulcer located on the Right Calcaneus . There was a Excisional Skin/Subcutaneous Tissue Debridement with a total area of 3.53 sq cm performed by Allen Derry, PA-C. With the following instrument(s): Curette to remove Viable and Non-Viable tissue/material. Material removed includes Subcutaneous Tissue and Slough and after achieving pain control using Lidocaine 4% T opical Solution. Monica specimens were taken. A time out was conducted at 14:21, prior to the start of the procedure. A Moderate amount of bleeding was controlled with Pressure. The procedure was tolerated well with a pain level of 3 throughout and a pain level of 3 following the procedure. Post Debridement Measurements: 1.8cm length x 2.5cm width x 0.8cm depth; 2.827cm^3 volume. Post debridement Stage noted as Category/Stage IV. Character of Wound/Ulcer Post Debridement is stable. Post procedure Diagnosis Wound #1: Same as Pre-Procedure Plan Follow-up Appointments: Return Appointment in 1 week. Bathing/ Shower/ Hygiene: Wash wounds with antibacterial soap and water. Doylene Canning dial Anesthetic (Use 'Patient Medications' Section for Anesthetic Order Entry): Lidocaine applied to wound bed WOUND #1: - Calcaneus Wound Laterality: Right Cleanser: Byram Ancillary Kit - 15 Day Supply 1 x Per Day/30 Days Discharge Instructions: Use supplies as instructed; Kit contains: (15)  Saline Bullets; (15) 3x3 Gauze; 15 pr Gloves Cleanser: Dakin 16 (oz) 0.25 1 x Per Day/30 Days Discharge Instructions: Use as directed. Cleanser: Soap and Water 1 x Per Day/30 Days Discharge Instructions: Gently cleanse wound with antibacterial soap, rinse and pat dry prior to dressing wounds Topical: Santyl Collagenase Ointment, 30 (gm), tube 1 x Per Day/30 Days Discharge Instructions: apply nickel thick to wound bed only Prim Dressing: Gauze (Generic) 1 x Per Day/30 Days ary Discharge Instructions: moistened with saline Secondary Dressing: (BORDER) Zetuvit Plus SILICONE BORDER Dressing 4x4 (in/in) (Generic) 1 x Per Day/30 Days Discharge Instructions: Please do not put silicone bordered dressings under wraps. Use non-bordered dressing only. 1. I would recommend that we have the patient continue to monitor for any signs of infection or worsening. Based on what I am seeing I do think that we are having good results with the dressing changes currently with the Santyl and Dakin's moistened gauze which is really cleaning this up quite well. 2. I am good recommend as well the patient should continue to monitor for any evidence of infection or worsening. Based on what I am seeing I think that we are making good headway towards closure, I am going to have the patient continue to utilize the same dressings over the next 2 weeks she states she cannot be here next week so she is going to see [place] back in 2 weeks. We will see patient back for reevaluation in 2 weeks here in the clinic. If anything worsens or changes patient will contact our office for additional recommendations. Electronic Signature(s) Signed: 07/04/2022 2:27:47 PM By: Allen Derry PA-C Entered By: Allen Derry on 07/04/2022 14:27:46 [name] ([id number]) [id number] Page 7 of 7 -------------------------------------------------------------------------------- SuperBill Details Patient Name: Date of  Service: [name], Monica RMA J. 07/04/2022 Medical Record Number: [id number] Patient Account Number: [id number] Date of Birth/Sex: Treating RN: [birth year] (89 y.o. [name] Primary Care Provider: Bethann Punches Other Clinician: Referring Provider: Treating Provider/Extender: [name] in Treatment: 6 Diagnosis Coding ICD-10  Codes Code Description L89.610 Pressure ulcer of right heel, unstageable M05.80 Other rheumatoid arthritis with rheumatoid factor of unspecified site Facility Procedures : CPT4 Code: [id number] 1 Description: 1042 - DEB SUBQ TISSUE 20 SQ CM/< ICD-10 Diagnosis Description L89.610 Pressure ulcer of right heel, unstageable Modifier: Quantity: 1 Physician Procedures : CPT4 Code Description Modifier [id number] 11042 - WC PHYS SUBQ TISS 20 SQ CM ICD-10 Diagnosis Description L89.610 Pressure ulcer of right heel, unstageable Quantity: 1 Electronic Signature(s) Signed: 07/04/2022 2:46:48 PM By: Monica Aver MSN RN CNS WTA Signed: 07/04/2022 5:11:00 PM By: Allen Derry PA-C Previous Signature: 07/04/2022 2:27:55 PM Version By: Allen Derry PA-C Entered By: [name] on 07/04/2022 14:46:48

## 2022-07-04 NOTE — Progress Notes (Addendum)
BEE, MARCHIANO ([id number]) [id number] Page 1 of 9 Visit Report for 07/04/2022 Arrival Information Details Patient Name: Date of Service: [facility], [name]. 07/04/2022 1:15 PM Medical Record Number: [id number] Patient Account Number: [id number] Date of Birth/Sex: Treating RN: [birth year] (89 y.o. [name] Primary Care [name]: [name] Other Clinician: Referring [name]: Treating [name]/Extender: [name] in Treatment: 6 Visit Information History Since Last Visit Added or deleted any medications: Monica Patient Arrived: Ambulatory Has Dressing in Place as Prescribed: Yes Arrival Time: 13:45 Pain Present Now: Monica Accompanied By: sister Transfer Assistance: None Patient Identification Verified: Yes Secondary Verification Process Completed: Yes Patient Requires Transmission-Based Precautions: Monica Patient Has Alerts: Yes Patient Alerts: NOT Diabetic PAD Bilateral ABI L1.08TBI .70 05/25/22 ABI R .97 TBI .70 05/25/22 Electronic Signature(s) Signed: 07/04/2022 2:41:28 PM By: Midge Aver MSN RN CNS WTA Entered By: Midge Aver on 07/04/2022 13:45:44 -------------------------------------------------------------------------------- Clinic Level of Care Assessment Details Patient Name: Date of Service: [facility], [name]. 07/04/2022 1:15 PM Medical Record Number: [id number] Patient Account Number: [id number] Date of Birth/Sex: Treating RN: September 21, 1933 (89 y.o. [name] Primary Care Kordelia Severin: [name] Other Clinician: Referring [name]: Treating [name]/Extender: [name] in Treatment: 6 Clinic Level of Care Assessment Items TOOL 4 Quantity Score X- 1 0 Use when only an EandM is performed on FOLLOW-UP visit ASSESSMENTS - Nursing Assessment / Reassessment X- 1 10 Reassessment of Co-morbidities (includes updates in patient status) X- 1 5 Reassessment of Adherence to Treatment Plan ASSESSMENTS - Wound and  Skin Assessment / Reassessment [name], [name] ([id number]) [id number] Page 2 of 9 X- 1 5 Simple Wound Assessment / Reassessment - one wound []  - 0 Complex Wound Assessment / Reassessment - multiple wounds []  - 0 Dermatologic / Skin Assessment (not related to wound area) ASSESSMENTS - Focused Assessment []  - 0 Circumferential Edema Measurements - multi extremities []  - 0 Nutritional Assessment / Counseling / Intervention []  - 0 Lower Extremity Assessment (monofilament, tuning fork, pulses) []  - 0 Peripheral Arterial Disease Assessment (using hand held doppler) ASSESSMENTS - Ostomy and/or Continence Assessment and Care []  - 0 Incontinence Assessment and Management []  - 0 Ostomy Care Assessment and Management (repouching, etc.) PROCESS - Coordination of Care X - Simple Patient / Family Education for ongoing care 1 15 []  - 0 Complex (extensive) Patient / Family Education for ongoing care X- 1 10 Staff obtains Chiropractor, Records, T Results / Process Orders est []  - 0 Staff telephones HHA, Nursing Homes / Clarify orders / etc []  - 0 Routine Transfer to another Facility (non-emergent condition) []  - 0 Routine Hospital Admission (non-emergent condition) []  - 0 New Admissions / Manufacturing engineer / Ordering NPWT Apligraf, etc. , []  - 0 Emergency Hospital Admission (emergent condition) X- 1 10 Simple Discharge Coordination []  - 0 Complex (extensive) Discharge Coordination PROCESS - Special Needs []  - 0 Pediatric / Minor Patient Management []  - 0 Isolation Patient Management []  - 0 Hearing / Language / Visual special needs []  - 0 Assessment of Community assistance (transportation, D/C planning, etc.) []  - 0 Additional assistance / Altered mentation []  - 0 Support Surface(s) Assessment (bed, cushion, seat, etc.) INTERVENTIONS - Wound Cleansing / Measurement X - Simple Wound Cleansing - one wound 1 5 []  - 0 Complex Wound Cleansing -  multiple wounds X- 1 5 Wound Imaging (photographs - any number of wounds) []  - 0 Wound Tracing (instead of photographs) X- 1 5 Simple Wound Measurement -  one wound []  - 0 Complex Wound Measurement - multiple wounds INTERVENTIONS - Wound Dressings X - Small Wound Dressing one or multiple wounds 1 10 []  - 0 Medium Wound Dressing one or multiple wounds []  - 0 Large Wound Dressing one or multiple wounds []  - 0 Application of Medications - topical []  - 0 Application of Medications - injection INTERVENTIONS - Miscellaneous []  - 0 External ear exam []  - 0 Specimen Collection (cultures, biopsies, blood, body fluids, etc.) [name], [name] ([id number]) [id number] Page 3 of 9 []  - 0 Specimen(s) / Culture(s) sent or taken to Lab for analysis []  - 0 Patient Transfer (multiple staff / Michiel Sites Lift / Similar devices) []  - 0 Simple Staple / Suture removal (25 or less) []  - 0 Complex Staple / Suture removal (26 or more) []  - 0 Hypo / Hyperglycemic Management (close monitor of Blood Glucose) []  - 0 Ankle / Brachial Index (ABI) - do not check if billed separately X- 1 5 Vital Signs Has the patient been seen at the hospital within the last three years: Yes Total Score: 85 Level Of Care: New/Established - Level 3 Electronic Signature(s) Signed: 07/04/2022 2:41:28 PM By: Midge Aver MSN RN CNS WTA Entered By: Midge Aver on 07/04/2022 14:22:48 -------------------------------------------------------------------------------- Encounter Discharge Information Details Patient Name: Date of Service: [name], [name]. 07/04/2022 1:15 PM Medical Record Number: [id number] Patient Account Number: [id number] Date of Birth/Sex: Treating RN: 1933-07-17 (89 y.o. [name] Primary Care Crimson Beer: [name] Other Clinician: Referring [name]: Treating [name]: [name] in Treatment: 6 Encounter Discharge Information Items Post Procedure  Vitals Discharge Condition: Stable Temperature (F): 97.8 Ambulatory Status: Ambulatory Pulse (bpm): 69 Discharge Destination: Home Respiratory Rate (breaths/min): 18 Transportation: Private Auto Blood Pressure (mmHg): 114/67 Accompanied By: sister Schedule Follow-up Appointment: Yes Clinical Summary of Care: Electronic Signature(s) Signed: 07/04/2022 2:47:34 PM By: Midge Aver MSN RN CNS WTA Previous Signature: 07/04/2022 2:41:28 PM Version By: Midge Aver MSN RN CNS WTA Previous Signature: 07/04/2022 2:13:17 PM Version By: Midge Aver MSN RN CNS WTA Entered By: Midge Aver on 07/04/2022 14:47:34 -------------------------------------------------------------------------------- Lower Extremity Assessment Details Patient Name: Date of Service: [name], [name]. 07/04/2022 1:15 PM [name] ([id number]) [id number] Page 4 of 9 Medical Record Number: [id number] Patient Account Number: [id number] Date of Birth/Sex: Treating RN: 01/07/1934 (89 y.o. [name] Primary Care Damaree Sargent: [name] Other Clinician: Referring Alphonsine Minium: Treating Marciano Mundt/Extender: [name] in Treatment: 6 Edema Assessment Assessed: [Left: Monica] [Right: Monica] Edema: [Left: N] [Right: o] Calf Left: Right: Point of Measurement: 32 cm From Medial Instep 32 cm Ankle Left: Right: Point of Measurement: 10 cm From Medial Instep 21 cm Vascular Assessment Pulses: Dorsalis Pedis Palpable: [Right:Yes] Electronic Signature(s) Signed: 07/04/2022 2:41:28 PM By: Midge Aver MSN RN CNS WTA Entered By: Midge Aver on 07/04/2022 14:02:55 -------------------------------------------------------------------------------- Multi Wound Chart Details Patient Name: Date of Service: [name], [name]. 07/04/2022 1:15 PM Medical Record Number: [id number] Patient Account Number: [id number] Date of Birth/Sex: Treating RN: 1933-08-04 (89 y.o. [name] Primary Care Lucine Bilski:  [name] Other Clinician: Referring Kalina Morabito: Treating Charlina Dwight/Extender: [name] in Treatment: 6 Vital Signs Height(in): 62 Pulse(bpm): 89 Weight(lbs): 118 Blood Pressure(mmHg): 114/67 Body Mass Index(BMI): 21.6 Temperature(F): 97.8 Respiratory Rate(breaths/min): 18 [1:Photos:] [N/A:N/A] Right Calcaneus N/A N/A Wound Location: Pressure Injury N/A N/A Wounding Event: Pressure Ulcer N/A N/A Primary EtiologyAUTUMNE, [name] ([id number]) [id number] Page 5 of 9 Hypertension, Rheumatoid  Arthritis N/A N/A Comorbid History: 03/25/2022 N/A N/A Date Acquired: 6 N/A N/A Weeks of Treatment: Open N/A N/A Wound Status: Monica N/A N/A Wound Recurrence: 1.8x2.5x0.8 N/A N/A Measurements L x W x D (cm) 3.534 N/A N/A A (cm) : rea 2.827 N/A N/A Volume (cm) : -76.40% N/A N/A % Reduction in A rea: -1313.50% N/A N/A % Reduction in Volume: Category/Stage IV N/A N/A Classification: Medium N/A N/A Exudate A mount: Serosanguineous N/A N/A Exudate Type: red, brown N/A N/A Exudate Color: None Present (0%) N/A N/A Granulation A mount: Large (67-100%) N/A N/A Necrotic A mount: Fat Layer (Subcutaneous Tissue): Yes N/A N/A Exposed Structures: Fascia: Monica Tendon: Monica Muscle: Monica Joint: Monica Bone: Monica None N/A N/A Epithelialization: Treatment Notes Electronic Signature(s) Signed: 07/04/2022 2:41:28 PM By: Midge Aver MSN RN CNS WTA Entered By: Midge Aver on 07/04/2022 14:03:13 -------------------------------------------------------------------------------- Multi-Disciplinary Care Plan Details Patient Name: Date of Service: [name], [name]. 07/04/2022 1:15 PM Medical Record Number: [id number] Patient Account Number: [id number] Date of Birth/Sex: Treating RN: May 23, 1933 (89 y.o. [name] Primary Care Renezmae Canlas: [name] Other Clinician: Referring Montgomery Favor: Treating Zeeshan Korte/Extender: [name] in  Treatment: 6 Active Inactive Necrotic Tissue Nursing Diagnoses: Impaired tissue integrity related to necrotic/devitalized tissue Knowledge deficit related to management of necrotic/devitalized tissue Goals: Necrotic/devitalized tissue will be minimized in the wound bed Date Initiated: 05/22/2022 Date Inactivated: 06/27/2022 Target Resolution Date: 06/22/2022 Goal Status: Met Patient/caregiver will verbalize understanding of reason and process for debridement of necrotic tissue Date Initiated: 05/22/2022 Target Resolution Date: 07/22/2022 Goal Status: Active Interventions: Assess patient pain level pre-, during and post procedure and prior to discharge Provide education on necrotic tissue and debridement process Treatment Activities: Apply topical anesthetic as ordered : 05/22/2022 [name], [name] ([id number]) [id number] Page 6 of 9 Enzymatic debridement : 05/22/2022 Notes: Wound/Skin Impairment Nursing Diagnoses: Impaired tissue integrity Knowledge deficit related to ulceration/compromised skin integrity Goals: Patient/caregiver will verbalize understanding of skin care regimen Date Initiated: 05/22/2022 Date Inactivated: 06/27/2022 Target Resolution Date: 06/22/2022 Goal Status: Met Ulcer/skin breakdown will have a volume reduction of 30% by week 4 Date Initiated: 05/22/2022 Date Inactivated: 06/27/2022 Target Resolution Date: 06/22/2022 Goal Status: Met Ulcer/skin breakdown will have a volume reduction of 50% by week 8 Date Initiated: 05/22/2022 Target Resolution Date: 07/22/2022 Goal Status: Active Ulcer/skin breakdown will have a volume reduction of 80% by week 12 Date Initiated: 05/22/2022 Target Resolution Date: 08/22/2022 Goal Status: Active Ulcer/skin breakdown will heal within 14 weeks Date Initiated: 05/22/2022 Target Resolution Date: 09/05/2022 Goal Status: Active Interventions: Assess patient/caregiver ability to obtain necessary supplies Assess  patient/caregiver ability to perform ulcer/skin care regimen upon admission and as needed Assess ulceration(s) every visit Provide education on ulcer and skin care Treatment Activities: Referred to DME Raissa Dam for dressing supplies : 05/22/2022 Skin care regimen initiated : 05/22/2022 Topical wound management initiated : 05/22/2022 Notes: Electronic Signature(s) Signed: 07/04/2022 2:47:03 PM By: Midge Aver MSN RN CNS WTA Previous Signature: 07/04/2022 2:11:58 PM Version By: Midge Aver MSN RN CNS WTA Entered By: Midge Aver on 07/04/2022 14:47:03 -------------------------------------------------------------------------------- Pain Assessment Details Patient Name: Date of Service: [name], [name]. 07/04/2022 1:15 PM Medical Record Number: [id number] Patient Account Number: [id number] Date of Birth/Sex: Treating RN: 1933/09/07 (89 y.o. [name] Primary Care Brittnee Gaetano: [name] Other Clinician: Referring Iesha Summerhill: Treating Nayleah Gamel/Extender: [name] in Treatment: 6 Active Problems Location of Pain Severity and Description of Pain Patient Has Paino Monica Site Locations [place], [place]  Robertson ([id number]) [id number] Page 7 of 9 Pain Management and Medication Current Pain Management: Electronic Signature(s) Signed: 07/04/2022 2:41:28 PM By: Midge Aver MSN RN CNS WTA Entered By: Midge Aver on 07/04/2022 13:51:45 -------------------------------------------------------------------------------- Patient/Caregiver Education Details Patient Name: Date of Service: [name], [name]. 6/25/2024andnbsp1:15 PM Medical Record Number: [id number] Patient Account Number: [id number] Date of Birth/Gender: Treating RN: 10-Jun-1933 (89 y.o. [name] Primary Care Physician: [name] Other Clinician: Referring Physician: Treating Physician/Extender: [name] in Treatment: 6 Education Assessment Education Provided  To: Patient Education Topics Provided Wound/Skin Impairment: Handouts: Caring for Your Ulcer Methods: Explain/Verbal Responses: State content correctly Electronic Signature(s) Signed: 07/04/2022 2:41:28 PM By: Midge Aver MSN RN CNS WTA Entered By: Midge Aver on 07/04/2022 14:12:10 [name] ([id number]) [id number] Page 8 of 9 -------------------------------------------------------------------------------- Wound Assessment Details Patient Name: Date of Service: [name], [name]. 07/04/2022 1:15 PM Medical Record Number: [id number] Patient Account Number: [id number] Date of Birth/Sex: Treating RN: [birth year] (89 y.o. [name] Primary Care Lorraina Spring: [name] Other Clinician: Referring Yocelyn Brocious: Treating Bertice Risse/Extender: [name] in Treatment: 6 Wound Status Wound Number: 1 Primary Etiology: Pressure Ulcer Wound Location: Right Calcaneus Wound Status: Open Wounding Event: Pressure Injury Comorbid History: Hypertension, Rheumatoid Arthritis Date Acquired: 03/25/2022 Weeks Of Treatment: 6 Clustered Wound: Monica Photos Wound Measurements Length: (cm) 1.8 Width: (cm) 2.5 Depth: (cm) 0.8 Area: (cm) 3.534 Volume: (cm) 2.827 % Reduction in Area: -76.4% % Reduction in Volume: -1313.5% Epithelialization: None Wound Description Classification: Category/Stage IV Exudate Amount: Medium Exudate Type: Serosanguineous Exudate Color: red, brown Foul Odor After Cleansing: Monica Slough/Fibrino Yes Wound Bed Granulation Amount: None Present (0%) Exposed Structure Necrotic Amount: Large (67-100%) Fascia Exposed: Monica Necrotic Quality: Adherent Slough Fat Layer (Subcutaneous Tissue) Exposed: Yes Tendon Exposed: Monica Muscle Exposed: Monica Joint Exposed: Monica Bone Exposed: Monica Treatment Notes Wound #1 (Calcaneus) Wound Laterality: Right Cleanser Byram Ancillary Kit - 15 Day Supply Discharge Instruction: Use supplies as instructed;  Kit contains: (15) Saline Bullets; (15) 3x3 Gauze; 15 pr Gloves Dakin 16 (oz) 0.25 [name], [name] ([id number]) [id number] Page 9 of 9 Discharge Instruction: Use as directed. Soap and Water Discharge Instruction: Gently cleanse wound with antibacterial soap, rinse and pat dry prior to dressing wounds Peri-Wound Care Topical Santyl Collagenase Ointment, 30 (gm), tube Discharge Instruction: apply nickel thick to wound bed only Primary Dressing Gauze Discharge Instruction: moistened with saline Secondary Dressing (BORDER) Zetuvit Plus SILICONE BORDER Dressing 4x4 (in/in) Discharge Instruction: Please do not put silicone bordered dressings under wraps. Use non-bordered dressing only. Secured With Compression Wrap Compression Stockings Facilities manager) Signed: 07/04/2022 2:41:28 PM By: Midge Aver MSN RN CNS WTA Entered By: Midge Aver on 07/04/2022 14:02:28 -------------------------------------------------------------------------------- Vitals Details Patient Name: Date of Service: [name], [name]. 07/04/2022 1:15 PM Medical Record Number: [id number] Patient Account Number: [id number] Date of Birth/Sex: Treating RN: 1933/08/31 (89 y.o. [name] Primary Care Kathelyn Gombos: [name] Other Clinician: Referring Ameah Chanda: Treating Romeo Zielinski/Extender: [name] in Treatment: 6 Vital Signs Time Taken: 13:47 Temperature (F): 97.8 Height (in): 62 Pulse (bpm): 89 Weight (lbs): 118 Respiratory Rate (breaths/min): 18 Body Mass Index (BMI): 21.6 Blood Pressure (mmHg): 114/67 Reference Range: 80 - 120 mg / dl Electronic Signature(s) Signed: 07/04/2022 2:41:28 PM By: Midge Aver MSN RN CNS WTA Entered By: Midge Aver on 07/04/2022 13:51:38

## 2022-07-11 ENCOUNTER — Ambulatory Visit: Payer: Medicare Other | Admitting: Physician Assistant

## 2022-07-18 ENCOUNTER — Encounter: Payer: Medicare Other | Attending: Physician Assistant | Admitting: Physician Assistant

## 2022-07-18 DIAGNOSIS — Z96652 Presence of left artificial knee joint: Secondary | ICD-10-CM | POA: Diagnosis not present

## 2022-07-18 DIAGNOSIS — M0580 Other rheumatoid arthritis with rheumatoid factor of unspecified site: Secondary | ICD-10-CM | POA: Diagnosis not present

## 2022-07-18 DIAGNOSIS — L89610 Pressure ulcer of right heel, unstageable: Secondary | ICD-10-CM | POA: Diagnosis present

## 2022-07-18 NOTE — Progress Notes (Addendum)
[name], [name] ([id number]) [id number] Page 1 of 7 Visit Report for 07/18/2022 Chief Complaint Document Details Patient Name: Date of Service: [facility], [name] 07/18/2022 1:15 PM Medical Record Number: [id number] Patient Account Number: [id number] Date of Birth/Sex: Treating RN: 1933-11-27 (89 y.o. [name] Primary Care Provider: Bethann Robertson Other Clinician: Referring Provider: Treating Provider/Extender: [name] in Treatment: 8 Information Obtained from: Patient Chief Complaint Right heel pressure ulcer Electronic Signature(s) Signed: 07/18/2022 7:04:22 PM By: Monica Derry PA-C Signed: 07/20/2022 5:16:05 PM By: Monica Aver MSN RN CNS WTA Previous Signature: 07/18/2022 1:27:28 PM Version By: Monica Derry PA-C Entered By: [name] on 07/18/2022 13:52:19 -------------------------------------------------------------------------------- Debridement Details Patient Name: Date of Service: [name], [name] 07/18/2022 1:15 PM Medical Record Number: [id number] Patient Account Number: [id number] Date of Birth/Sex: Treating RN: January 03, 1934 (89 y.o. [name] Primary Care Provider: Bethann Robertson Other Clinician: Referring Provider: Treating Provider/Extender: [name] in Treatment: 8 Debridement Performed for Assessment: Wound #1 Right Calcaneus Performed By: Physician Monica Derry, PA-C Debridement Type: Debridement Level of Consciousness (Pre-procedure): Awake and Alert Pre-procedure Verification/Time Out Yes - 13:48 Taken: Start Time: 13:48 Pain Control: Lidocaine 4% T opical Solution Percent of Wound Bed Debrided: 100% T Area Debrided (cm): otal 0.82 Tissue and other material debrided: Viable, Non-Viable, Slough, Subcutaneous, Slough Level: Skin/Subcutaneous Tissue Debridement Description: Excisional Instrument: Curette Bleeding: Minimum Hemostasis Achieved: Pressure Procedural Pain: 2 [name], [name]  ([id number]) [id number] Page 2 of 7 Post Procedural Pain: 2 Response to Treatment: Procedure was tolerated well Level of Consciousness (Post- Awake and Alert procedure): Post Debridement Measurements of Total Wound Length: (cm) 0.7 Stage: Category/Stage IV Width: (cm) 1.5 Depth: (cm) 0.7 Volume: (cm) 0.577 Character of Wound/Ulcer Post Debridement: Stable Post Procedure Diagnosis Same as Pre-procedure Electronic Signature(s) Signed: 07/18/2022 7:04:22 PM By: Monica Derry PA-C Signed: 07/20/2022 5:16:05 PM By: Monica Aver MSN RN CNS WTA Entered By: [name] on 07/18/2022 13:50:07 -------------------------------------------------------------------------------- HPI Details Patient Name: Date of Service: [name], [name] 07/18/2022 1:15 PM Medical Record Number: [id number] Patient Account Number: [id number] Date of Birth/Sex: Treating RN: 1933/07/28 (89 y.o. [name] Primary Care Provider: Bethann Robertson Other Clinician: Referring Provider: Treating Provider/Extender: [name] in Treatment: 8 History of Present Illness HPI Description: 05-22-2022 upon evaluation today patient appears to be having issues with the wound on her right heel. This actually occurred during a surgical intervention for her left knee she had a knee replacement. She went in with no issues and when she got home or even while she was staying overnight before being discharged home noted to have an issue with a discolored area on her right heel. She is unsure how this happened but knows it was not present prior to the surgery. She has been monitoring since it is now an eschar covered region that obviously is going to need to be removed in order to allow this to heal. With that being said her ABIs were not registering very well NuShield today it appears that she may have a issue with good arterial flow this could be somewhat accounting for what is going on with the  heel. Nonetheless I do think she is probably going to be seen by a vein and vascular for arterial studies. She had the surgery on Friday and came home on a Saturday. This was a very short stay. Patient does have a history of rheumatoid arthritis but really no other major  medical problems. 05-29-2022 upon evaluation today patient appears to be doing well currently in regard to her wound. In fact she is telling me that she is doing quite well at this point. Fortunately there does not appear to be any signs of active infection locally nor systemically at this time which is great news. No fevers, chills, nausea, vomiting, or diarrhea. The good news that she did get the Santyl although it took until Friday. 06-15-2022 upon evaluation today patient appears to be doing well currently in regard to her heel ulcer. We are slowly getting this to loosen up and clearway. Fortunately I do not see any signs of active infection at this time which is great news I think that we are definitely ready to clear away some of the necrotic debris today. 06-20-2022 upon evaluation today patient's wound is actually showing signs of good improvement with regard to the heel. She has been tolerating the dressing changes without complication and currently I am going to recommend that we go ahead and continue with the plan using the Santyl to help clean up the surface of this wound. Really she seems to be doing quite well with that. 06-27-2022 upon evaluation patient actually appears to be doing better she is slowly showing signs of improvement. With that being said the wound is a bit deeper but this is only because we are getting to the base of the wound as far as cleaning out the necrotic tissue is concerned. Fortunately I think the Santyl is loosening things up so we can actually do this. I think we are going to switch to using Dakin's moistened gauze over top of the Santyl as of today. 07-04-2022 upon evaluation today patient appears  to be doing well currently in regard to her wound. She has been tolerating the dressing changes without complication. Fortunately I do not see any signs of active infection at this time which is great news and in general I do believe that removing in the appropriate direction here. She is going to require some debridement today. 07-18-2022 upon evaluation today patient appears to be doing well currently in regard to her wound. In fact this is showing signs of improvement which is great news. Fortunately I do not see any evidence of active infection locally or systemically which is great news. No fevers, chills, nausea, vomiting, or diarrhea. [name], [name] ([id number]) [id number] Page 3 of 7 Electronic Signature(s) Signed: 07/18/2022 1:54:35 PM By: Monica Derry PA-C Entered By: [name] on 07/18/2022 13:54:35 -------------------------------------------------------------------------------- Physical Exam Details Patient Name: Date of Service: [name], [name] 07/18/2022 1:15 PM Medical Record Number: [id number] Patient Account Number: [id number] Date of Birth/Sex: Treating RN: 21-Feb-1933 (89 y.o. [name] Primary Care Provider: Bethann Robertson Other Clinician: Referring Provider: Treating Provider/Extender: [name] in Treatment: 8 Constitutional Well-nourished and well-hydrated in no acute distress. Respiratory normal breathing without difficulty. Psychiatric this patient is able to make decisions and demonstrates good insight into disease process. Alert and Oriented x 3. pleasant and cooperative. Notes Upon inspection patient's wound bed actually showed signs of good granulation epithelization at this point. Fortunately I do not see any evidence of worsening overall and I do believe that the patient is making good headway towards closure. I am informed debridement clearway necrotic debris today she tolerated that debridement without  complication and postdebridement the wound bed is significantly improved which is great news. Electronic Signature(s) Signed: 07/18/2022 1:55:00 PM By: Monica Derry PA-C Entered By: [name]  on 07/18/2022 13:55:00 -------------------------------------------------------------------------------- Physician Orders Details Patient Name: Date of Service: [facility], [name] 07/18/2022 1:15 PM Medical Record Number: [id number] Patient Account Number: [id number] Date of Birth/Sex: Treating RN: [birth year] (89 y.o. [name] Primary Care Provider: Bethann Robertson Other Clinician: Referring Provider: Treating Provider/Extender: [name] in Treatment: 8 Verbal / Phone Orders: No Diagnosis Coding ICD-10 Coding Code Description L89.610 Pressure ulcer of right heel, ANISIA, LEIJA ([id number]) [id number] Page 4 of 7 M05.80 Other rheumatoid arthritis with rheumatoid factor of unspecified site Follow-up Appointments Return Appointment in 1 week. Bathing/ [employer] wounds with antibacterial soap and water. [name] Anesthetic (Use 'Patient Medications' Section for Anesthetic Order Entry) Lidocaine applied to wound bed Wound Treatment Wound #1 - Calcaneus Wound Laterality: Right Cleanser: Byram Ancillary Kit - 15 Day Supply 1 x Per Day/30 Days Discharge Instructions: Use supplies as instructed; Kit contains: (15) Saline Bullets; (15) 3x3 Gauze; 15 pr Gloves Cleanser: Dakin 16 (oz) 0.25 1 x Per Day/30 Days Discharge Instructions: Use as directed. Cleanser: Soap and Water 1 x Per Day/30 Days Discharge Instructions: Gently cleanse wound with antibacterial soap, rinse and pat dry prior to dressing wounds Topical: Santyl Collagenase Ointment, 30 (gm), tube 1 x Per Day/30 Days Discharge Instructions: apply nickel thick to wound bed only Prim Dressing: Gauze (Generic) 1 x Per Day/30 Days ary Discharge Instructions: moistened with  saline Secondary Dressing: (BORDER) Zetuvit Plus SILICONE BORDER Dressing 4x4 (in/in) (Generic) 1 x Per Day/30 Days Discharge Instructions: Please do not put silicone bordered dressings under wraps. Use non-bordered dressing only. Electronic Signature(s) Signed: 07/18/2022 7:04:22 PM By: Monica Derry PA-C Signed: 07/20/2022 5:16:05 PM By: Monica Aver MSN RN CNS WTA Entered By: [name] on 07/18/2022 13:50:46 -------------------------------------------------------------------------------- Problem List Details Patient Name: Date of Service: [name], [name] 07/18/2022 1:15 PM Medical Record Number: [id number] Patient Account Number: [id number] Date of Birth/Sex: Treating RN: Jun 24, 1933 (89 y.o. [name] Primary Care Provider: Bethann Robertson Other Clinician: Referring Provider: Treating Provider/Extender: [name] in Treatment: 8 Active Problems ICD-10 Encounter Code Description Active Date MDM Diagnosis L89.610 Pressure ulcer of right heel, unstageable 05/22/2022 No Yes M05.80 Other rheumatoid arthritis with rheumatoid factor of unspecified site 05/22/2022 No Yes [name], [name] ([id number]) [id number] Page 5 of 7 Inactive Problems Resolved Problems Electronic Signature(s) Signed: 07/18/2022 7:04:22 PM By: Monica Derry PA-C Signed: 07/20/2022 5:16:05 PM By: Monica Aver MSN RN CNS WTA Previous Signature: 07/18/2022 1:27:22 PM Version By: Monica Derry PA-C Entered By: [name] on 07/18/2022 13:52:14 -------------------------------------------------------------------------------- Progress Note Details Patient Name: Date of Service: [name], [name] 07/18/2022 1:15 PM Medical Record Number: [id number] Patient Account Number: [id number] Date of Birth/Sex: Treating RN: 1933-04-29 (89 y.o. [name] Primary Care Provider: Bethann Robertson Other Clinician: Referring Provider: Treating Provider/Extender: [name] in  Treatment: 8 Subjective Chief Complaint Information obtained from Patient Right heel pressure ulcer History of Present Illness (HPI) 05-22-2022 upon evaluation today patient appears to be having issues with the wound on her right heel. This actually occurred during a surgical intervention for her left knee she had a knee replacement. She went in with no issues and when she got home or even while she was staying overnight before being discharged home noted to have an issue with a discolored area on her right heel. She is unsure how this happened but knows it was not present prior to the surgery. She  has been monitoring since it is now an eschar covered region that obviously is going to need to be removed in order to allow this to heal. With that being said her ABIs were not registering very well NuShield today it appears that she may have a issue with good arterial flow this could be somewhat accounting for what is going on with the heel. Nonetheless I do think she is probably going to be seen by a vein and vascular for arterial studies. She had the surgery on Friday and came home on a Saturday. This was a very short stay. Patient does have a history of rheumatoid arthritis but really no other major medical problems. 05-29-2022 upon evaluation today patient appears to be doing well currently in regard to her wound. In fact she is telling me that she is doing quite well at this point. Fortunately there does not appear to be any signs of active infection locally nor systemically at this time which is great news. No fevers, chills, nausea, vomiting, or diarrhea. The good news that she did get the Santyl although it took until Friday. 06-15-2022 upon evaluation today patient appears to be doing well currently in regard to her heel ulcer. We are slowly getting this to loosen up and clearway. Fortunately I do not see any signs of active infection at this time which is great news I think that we are definitely  ready to clear away some of the necrotic debris today. 06-20-2022 upon evaluation today patient's wound is actually showing signs of good improvement with regard to the heel. She has been tolerating the dressing changes without complication and currently I am going to recommend that we go ahead and continue with the plan using the Santyl to help clean up the surface of this wound. Really she seems to be doing quite well with that. 06-27-2022 upon evaluation patient actually appears to be doing better she is slowly showing signs of improvement. With that being said the wound is a bit deeper but this is only because we are getting to the base of the wound as far as cleaning out the necrotic tissue is concerned. Fortunately I think the Santyl is loosening things up so we can actually do this. I think we are going to switch to using Dakin's moistened gauze over top of the Santyl as of today. 07-04-2022 upon evaluation today patient appears to be doing well currently in regard to her wound. She has been tolerating the dressing changes without complication. Fortunately I do not see any signs of active infection at this time which is great news and in general I do believe that removing in the appropriate direction here. She is going to require some debridement today. 07-18-2022 upon evaluation today patient appears to be doing well currently in regard to her wound. In fact this is showing signs of improvement which is great news. Fortunately I do not see any evidence of active infection locally or systemically which is great news. No fevers, chills, nausea, vomiting, or diarrhea. [address] [name], [name] ([id number]) [id number] Page 6 of 7 Constitutional Well-nourished and well-hydrated in no acute distress. Vitals Time Taken: 1:25 PM, Height: 62 in, Weight: 118 lbs, BMI: 21.6, Temperature: 97.7 F, Pulse: 99 bpm, Respiratory Rate: 18 breaths/min, Blood Pressure: 131/83  mmHg. Respiratory normal breathing without difficulty. Psychiatric this patient is able to make decisions and demonstrates good insight into disease process. Alert and Oriented x 3. pleasant and cooperative. General Notes: Upon inspection patient's wound bed actually  showed signs of good granulation epithelization at this point. Fortunately I do not see any evidence of worsening overall and I do believe that the patient is making good headway towards closure. I am informed debridement clearway necrotic debris today she tolerated that debridement without complication and postdebridement the wound bed is significantly improved which is great news. Integumentary (Hair, Skin) Wound #1 status is Open. Original cause of wound was Pressure Injury. The date acquired was: 03/25/2022. The wound has been in treatment 8 weeks. The wound is located on the Right Calcaneus. The wound measures 0.7cm length x 1.5cm width x 0.7cm depth; 0.825cm^2 area and 0.577cm^3 volume. There is Fat Layer (Subcutaneous Tissue) exposed. There is a medium amount of serosanguineous drainage noted. There is no granulation within the wound bed. There is a large (67-100%) amount of necrotic tissue within the wound bed including Adherent Slough. Assessment Active Problems ICD-10 Pressure ulcer of right heel, unstageable Other rheumatoid arthritis with rheumatoid factor of unspecified site Procedures Wound #1 Pre-procedure diagnosis of Wound #1 is a Pressure Ulcer located on the Right Calcaneus . There was a Excisional Skin/Subcutaneous Tissue Debridement with a total area of 0.82 sq cm performed by Monica Derry, PA-C. With the following instrument(s): Curette to remove Viable and Non-Viable tissue/material. Material removed includes Subcutaneous Tissue and Slough and after achieving pain control using Lidocaine 4% T opical Solution. No specimens were taken. A time out was conducted at 13:48, prior to the start of the procedure. A  Minimum amount of bleeding was controlled with Pressure. The procedure was tolerated well with a pain level of 2 throughout and a pain level of 2 following the procedure. Post Debridement Measurements: 0.7cm length x 1.5cm width x 0.7cm depth; 0.577cm^3 volume. Post debridement Stage noted as Category/Stage IV. Character of Wound/Ulcer Post Debridement is stable. Post procedure Diagnosis Wound #1: Same as Pre-Procedure Plan Follow-up Appointments: Return Appointment in 1 week. Bathing/ Shower/ Hygiene: Wash wounds with antibacterial soap and water. [name] Anesthetic (Use 'Patient Medications' Section for Anesthetic Order Entry): Lidocaine applied to wound bed WOUND #1: - Calcaneus Wound Laterality: Right Cleanser: Byram Ancillary Kit - 15 Day Supply 1 x Per Day/30 Days Discharge Instructions: Use supplies as instructed; Kit contains: (15) Saline Bullets; (15) 3x3 Gauze; 15 pr Gloves Cleanser: Dakin 16 (oz) 0.25 1 x Per Day/30 Days Discharge Instructions: Use as directed. Cleanser: Soap and Water 1 x Per Day/30 Days Discharge Instructions: Gently cleanse wound with antibacterial soap, rinse and pat dry prior to dressing wounds Topical: Santyl Collagenase Ointment, 30 (gm), tube 1 x Per Day/30 Days Discharge Instructions: apply nickel thick to wound bed only Prim Dressing: Gauze (Generic) 1 x Per Day/30 Days ary Discharge Instructions: moistened with saline Secondary Dressing: (BORDER) Zetuvit Plus SILICONE BORDER Dressing 4x4 (in/in) (Generic) 1 x Per Day/30 Days Discharge Instructions: Please do not put silicone bordered dressings under wraps. Use non-bordered dressing only. 1. I am going to recommend that we have the patient continue to monitor for any evidence of worsening or infection. Based on what I am seeing I do think that we are making good headway towards complete closure. 2. I am good recommend as well that the patient should continue to utilize the Santyl and Dakin's  moistened gauze behind which I think is doing quite well. 3. I am also can recommend we continue with bordered foam dressing to cover and she should be changing this daily. [name], [name] ([id number]) [id number] Page 7 of 7 We will see  patient back for reevaluation in 1 week here in the clinic. If anything worsens or changes patient will contact our office for additional recommendations. Electronic Signature(s) Signed: 07/18/2022 1:55:29 PM By: Monica Derry PA-C Entered By: [name] on 07/18/2022 13:55:29 -------------------------------------------------------------------------------- SuperBill Details Patient Name: Date of Service: [name], [name] 07/18/2022 Medical Record Number: [id number] Patient Account Number: [id number] Date of Birth/Sex: Treating RN: [birth year] (89 y.o. [name] Primary Care Provider: Bethann Robertson Other Clinician: Referring Provider: Treating Provider/Extender: [name] in Treatment: 8 Diagnosis Coding ICD-10 Codes Code Description L89.610 Pressure ulcer of right heel, unstageable M05.80 Other rheumatoid arthritis with rheumatoid factor of unspecified site Facility Procedures : CPT4 Code: [id number] Description: 11042 - DEB SUBQ TISSUE 20 SQ CM/< ICD-10 Diagnosis Description L89.610 Pressure ulcer of right heel, unstageable Modifier: Quantity: 1 Physician Procedures : CPT4 Code Description Modifier [id number] 11042 - WC PHYS SUBQ TISS 20 SQ CM ICD-10 Diagnosis Description L89.610 Pressure ulcer of right heel, unstageable Quantity: 1 Electronic Signature(s) Signed: 07/18/2022 1:55:35 PM By: Monica Derry PA-C Entered By: [name] on 07/18/2022 13:55:35

## 2022-07-21 NOTE — Progress Notes (Signed)
[name], [name] ([id number]) [id number] Page 1 of 9 Visit Report for 07/18/2022 Arrival Information Details Patient Name: Date of Service: [name], [name]. 07/18/2022 1:15 PM Medical Record Number: [id number] Patient Account Number: [id number] Date of Birth/Sex: Treating RN: 26-Jul-1933 (89 y.o. [name] Primary Care [name]: Bethann Punches Other Clinician: Referring [name]: Treating [name]/Extender: [name] in Treatment: 8 Visit Information History Since Last Visit Added or deleted any medications: Monica Patient Arrived: Ambulatory Any new allergies or adverse reactions: Monica Arrival Time: 13:21 Has Dressing in Place as Prescribed: Yes Accompanied By: sister Pain Present Now: Monica Transfer Assistance: None Patient Identification Verified: Yes Secondary Verification Process Completed: Yes Patient Requires Transmission-Based Precautions: Monica Patient Has Alerts: Yes Patient Alerts: NOT Diabetic PAD Bilateral ABI L1.08TBI .70 05/25/22 ABI R .97 TBI .70 05/25/22 Electronic Signature(s) Signed: 07/20/2022 5:16:05 PM By: Midge Aver MSN RN CNS WTA Entered By: Midge Aver on 07/18/2022 13:25:50 -------------------------------------------------------------------------------- Clinic Level of Care Assessment Details Patient Name: Date of Service: [facility], [name]. 07/18/2022 1:15 PM Medical Record Number: [id number] Patient Account Number: [id number] Date of Birth/Sex: Treating RN: 1933-07-06 (89 y.o. [name] Primary Care [name]: Bethann Punches Other Clinician: Referring [name]: Treating [name]/Extender: [name] in Treatment: 8 Clinic Level of Care Assessment Items TOOL 4 Quantity Score []  - 0 Use when only an EandM is performed on FOLLOW-UP visit ASSESSMENTS - Nursing Assessment / Reassessment []  - 0 Reassessment of Co-morbidities (includes updates in patient status) []  - 0 Reassessment of Adherence to  Treatment Plan ASSESSMENTS - Wound and Skin Assessment / Reassessment [name], [name] ([id number]) [id number] Page 2 of 9 []  - 0 Simple Wound Assessment / Reassessment - one wound []  - 0 Complex Wound Assessment / Reassessment - multiple wounds []  - 0 Dermatologic / Skin Assessment (not related to wound area) ASSESSMENTS - Focused Assessment []  - 0 Circumferential Edema Measurements - multi extremities []  - 0 Nutritional Assessment / Counseling / Intervention []  - 0 Lower Extremity Assessment (monofilament, tuning fork, pulses) []  - 0 Peripheral Arterial Disease Assessment (using hand held doppler) ASSESSMENTS - Ostomy and/or Continence Assessment and Care []  - 0 Incontinence Assessment and Management []  - 0 Ostomy Care Assessment and Management (repouching, etc.) PROCESS - Coordination of Care []  - 0 Simple Patient / Family Education for ongoing care []  - 0 Complex (extensive) Patient / Family Education for ongoing care []  - 0 Staff obtains Chiropractor, Records, T Results / Process Orders est []  - 0 Staff telephones HHA, Nursing Homes / Clarify orders / etc []  - 0 Routine Transfer to another Facility (non-emergent condition) []  - 0 Routine Hospital Admission (non-emergent condition) []  - 0 New Admissions / Manufacturing engineer / Ordering NPWT Apligraf, etc. , []  - 0 Emergency Hospital Admission (emergent condition) []  - 0 Simple Discharge Coordination []  - 0 Complex (extensive) Discharge Coordination PROCESS - Special Needs []  - 0 Pediatric / Minor Patient Management []  - 0 Isolation Patient Management []  - 0 Hearing / Language / Visual special needs []  - 0 Assessment of Community assistance (transportation, D/C planning, etc.) []  - 0 Additional assistance / Altered mentation []  - 0 Support Surface(s) Assessment (bed, cushion, seat, etc.) INTERVENTIONS - Wound Cleansing / Measurement []  - 0 Simple Wound Cleansing - one wound []   - 0 Complex Wound Cleansing - multiple wounds []  - 0 Wound Imaging (photographs - any number of wounds) []  - 0 Wound Tracing (instead of photographs) []  -  0 Simple Wound Measurement - one wound []  - 0 Complex Wound Measurement - multiple wounds INTERVENTIONS - Wound Dressings []  - 0 Small Wound Dressing one or multiple wounds []  - 0 Medium Wound Dressing one or multiple wounds []  - 0 Large Wound Dressing one or multiple wounds []  - 0 Application of Medications - topical []  - 0 Application of Medications - injection INTERVENTIONS - Miscellaneous []  - 0 External ear exam []  - 0 Specimen Collection (cultures, biopsies, blood, body fluids, etc.) [name], [name] ([id number]) [id number] Page 3 of 9 []  - 0 Specimen(s) / Culture(s) sent or taken to Lab for analysis []  - 0 Patient Transfer (multiple staff / Michiel Sites Lift / Similar devices) []  - 0 Simple Staple / Suture removal (25 or less) []  - 0 Complex Staple / Suture removal (26 or more) []  - 0 Hypo / Hyperglycemic Management (close monitor of Blood Glucose) []  - 0 Ankle / Brachial Index (ABI) - do not check if billed separately []  - 0 Vital Signs Has the patient been seen at the hospital within the last three years: Yes Total Score: 0 Level Of Care: ____ Electronic Signature(s) Signed: 07/20/2022 5:16:05 PM By: Midge Aver MSN RN CNS WTA Entered By: Midge Aver on 07/18/2022 13:51:11 -------------------------------------------------------------------------------- Encounter Discharge Information Details Patient Name: Date of Service: [name], [name]. 07/18/2022 1:15 PM Medical Record Number: [id number] Patient Account Number: [id number] Date of Birth/Sex: Treating RN: [birth year] (89 y.o. [name] Primary Care [name]: Bethann Punches Other Clinician: Referring [name]: Treating [name]/Extender: [name] in Treatment: 8 Encounter Discharge Information Items Post  Procedure Vitals Discharge Condition: Stable Temperature (F): 97.7 Ambulatory Status: Ambulatory Pulse (bpm): 99 Discharge Destination: Home Respiratory Rate (breaths/min): 18 Transportation: Private Auto Blood Pressure (mmHg): 131/83 Accompanied By: sister Schedule Follow-up Appointment: Yes Clinical Summary of Care: Electronic Signature(s) Signed: 07/20/2022 5:16:05 PM By: Midge Aver MSN RN CNS WTA Entered By: Midge Aver on 07/18/2022 13:53:04 -------------------------------------------------------------------------------- Lower Extremity Assessment Details Patient Name: Date of Service: [name], [name]. 07/18/2022 1:15 PM Medical Record Number: [id number] Patient Account Number: [id number] [name], [name] ([id number]) [id number] Page 4 of 9 Date of Birth/Sex: Treating RN: 1933/05/29 (89 y.o. [name] Primary Care Findley Vi: Other Clinician: Bethann Punches Referring Jerika Wales: Treating Kruz Chiu/Extender: [name] in Treatment: 8 Edema Assessment Assessed: [Left: Monica] [Right: Monica] [Left: Edema] [Right: :] Calf Left: Right: Point of Measurement: 32 cm From Medial Instep 32 cm Ankle Left: Right: Point of Measurement: 10 cm From Medial Instep 21 cm Vascular Assessment Pulses: Dorsalis Pedis Palpable: [Right:Yes] Electronic Signature(s) Signed: 07/20/2022 5:16:05 PM By: Midge Aver MSN RN CNS WTA Entered By: Midge Aver on 07/18/2022 13:39:34 -------------------------------------------------------------------------------- Multi Wound Chart Details Patient Name: Date of Service: [name], [name]. 07/18/2022 1:15 PM Medical Record Number: [id number] Patient Account Number: [id number] Date of Birth/Sex: Treating RN: 10-10-1933 (89 y.o. [name] Primary Care Caasi Giglia: Bethann Punches Other Clinician: Referring Primo Innis: Treating Trevaughn Schear/Extender: [name] in Treatment: 8 Vital Signs Height(in):  62 Pulse(bpm): 99 Weight(lbs): 118 Blood Pressure(mmHg): 131/83 Body Mass Index(BMI): 21.6 Temperature(F): 97.7 Respiratory Rate(breaths/min): 18 [1:Photos:] [N/A:N/A] Right Calcaneus N/A N/A Wound Location: Pressure Injury N/A N/A Wounding Event: Pressure Ulcer N/A N/A Primary Etiology: Hypertension, Rheumatoid Arthritis N/A N/A Comorbid HistoryLISAANN, [name] ([id number]) [id number] Page 5 of 9 03/25/2022 N/A N/A Date Acquired: 8 N/A N/A Weeks of Treatment: Open N/A N/A Wound Status: Monica N/A N/A Wound Recurrence:  0.7x1.5x0.7 N/A N/A Measurements L x W x D (cm) 0.825 N/A N/A A (cm) : rea 0.577 N/A N/A Volume (cm) : 58.80% N/A N/A % Reduction in A rea: -188.50% N/A N/A % Reduction in Volume: Category/Stage IV N/A N/A Classification: Medium N/A N/A Exudate A mount: Serosanguineous N/A N/A Exudate Type: red, brown N/A N/A Exudate Color: None Present (0%) N/A N/A Granulation A mount: Large (67-100%) N/A N/A Necrotic A mount: Fat Layer (Subcutaneous Tissue): Yes N/A N/A Exposed Structures: Fascia: Monica Tendon: Monica Muscle: Monica Joint: Monica Bone: Monica None N/A N/A Epithelialization: Treatment Notes Electronic Signature(s) Signed: 07/20/2022 5:16:05 PM By: Midge Aver MSN RN CNS WTA Entered By: Midge Aver on 07/18/2022 13:46:40 -------------------------------------------------------------------------------- Multi-Disciplinary Care Plan Details Patient Name: Date of Service: [name], [name]. 07/18/2022 1:15 PM Medical Record Number: [id number] Patient Account Number: [id number] Date of Birth/Sex: Treating RN: 01-04-1934 (89 y.o. [name] Primary Care Demontae Antunes: Bethann Punches Other Clinician: Referring Tara Wich: Treating Devaunte Gasparini/Extender: [name] in Treatment: 8 Active Inactive Necrotic Tissue Nursing Diagnoses: Impaired tissue integrity related to necrotic/devitalized tissue Knowledge deficit related to  management of necrotic/devitalized tissue Goals: Necrotic/devitalized tissue will be minimized in the wound bed Date Initiated: 05/22/2022 Date Inactivated: 06/27/2022 Target Resolution Date: 06/22/2022 Goal Status: Met Patient/caregiver will verbalize understanding of reason and process for debridement of necrotic tissue Date Initiated: 05/22/2022 Target Resolution Date: 08/09/2022 Goal Status: Active Interventions: Assess patient pain level pre-, during and post procedure and prior to discharge Provide education on necrotic tissue and debridement process Treatment Activities: Apply topical anesthetic as ordered : 05/22/2022 Enzymatic debridement : 05/22/2022 [name], [name] ([id number]) [id number] Page 6 of 9 Notes: Wound/Skin Impairment Nursing Diagnoses: Impaired tissue integrity Knowledge deficit related to ulceration/compromised skin integrity Goals: Patient/caregiver will verbalize understanding of skin care regimen Date Initiated: 05/22/2022 Date Inactivated: 06/27/2022 Target Resolution Date: 06/22/2022 Goal Status: Met Ulcer/skin breakdown will have a volume reduction of 30% by week 4 Date Initiated: 05/22/2022 Date Inactivated: 06/27/2022 Target Resolution Date: 06/22/2022 Goal Status: Met Ulcer/skin breakdown will have a volume reduction of 50% by week 8 Date Initiated: 05/22/2022 Date Inactivated: 07/18/2022 Target Resolution Date: 07/22/2022 Goal Status: Met Ulcer/skin breakdown will have a volume reduction of 80% by week 12 Date Initiated: 05/22/2022 Target Resolution Date: 08/22/2022 Goal Status: Active Ulcer/skin breakdown will heal within 14 weeks Date Initiated: 05/22/2022 Target Resolution Date: 09/05/2022 Goal Status: Active Interventions: Assess patient/caregiver ability to obtain necessary supplies Assess patient/caregiver ability to perform ulcer/skin care regimen upon admission and as needed Assess ulceration(s) every visit Provide  education on ulcer and skin care Treatment Activities: Referred to DME Tressy Kunzman for dressing supplies : 05/22/2022 Skin care regimen initiated : 05/22/2022 Topical wound management initiated : 05/22/2022 Notes: Electronic Signature(s) Signed: 07/20/2022 5:16:05 PM By: Midge Aver MSN RN CNS WTA Entered By: Midge Aver on 07/18/2022 13:51:43 -------------------------------------------------------------------------------- Pain Assessment Details Patient Name: Date of Service: [name], [name]. 07/18/2022 1:15 PM Medical Record Number: [id number] Patient Account Number: [id number] Date of Birth/Sex: Treating RN: [birth year] (89 y.o. [name] Primary Care Kaedin Hicklin: Bethann Punches Other Clinician: Referring Reeda Soohoo: Treating Shuree Brossart/Extender: [name] in Treatment: 8 Active Problems Location of Pain Severity and Description of Pain Patient Has Paino Monica Site Locations [place], [place] Robertson ([id number]) [id number] Page 7 of 9 Pain Management and Medication Current Pain Management: Electronic Signature(s) Signed: 07/20/2022 5:16:05 PM By: Midge Aver MSN RN CNS WTA Entered By: Midge Aver on 07/18/2022 13:29:36 -------------------------------------------------------------------------------- Patient/Caregiver  Education Details Patient Name: Date of Service: [name], [name]. 7/9/2024andnbsp1:15 PM Medical Record Number: [id number] Patient Account Number: [id number] Date of Birth/Gender: Treating RN: [birth year] (89 y.o. [name] Primary Care Physician: Bethann Punches Other Clinician: Referring Physician: Treating Physician/Extender: [name] in Treatment: 8 Education Assessment Education Provided To: Patient Education Topics Provided Wound Debridement: Handouts: Wound Debridement Methods: Explain/Verbal Responses: State content correctly Wound/Skin Impairment: Handouts: Caring for Your Ulcer Methods:  Explain/Verbal Responses: State content correctly Electronic Signature(s) Signed: 07/20/2022 5:16:05 PM By: Midge Aver MSN RN CNS WTA Entered By: Midge Aver on 07/18/2022 13:52:04 [name] ([id number]) [id number] Page 8 of 9 -------------------------------------------------------------------------------- Wound Assessment Details Patient Name: Date of Service: [name], [name]. 07/18/2022 1:15 PM Medical Record Number: [id number] Patient Account Number: [id number] Date of Birth/Sex: Treating RN: 05-19-1933 (89 y.o. [name] Primary Care Tomesha Sargent: Bethann Punches Other Clinician: Referring Trypp Heckmann: Treating Stephane Junkins/Extender: [name] in Treatment: 8 Wound Status Wound Number: 1 Primary Etiology: Pressure Ulcer Wound Location: Right Calcaneus Wound Status: Open Wounding Event: Pressure Injury Comorbid History: Hypertension, Rheumatoid Arthritis Date Acquired: 03/25/2022 Weeks Of Treatment: 8 Clustered Wound: Monica Photos Wound Measurements Length: (cm) 0.7 Width: (cm) 1.5 Depth: (cm) 0.7 Area: (cm) 0.825 Volume: (cm) 0.577 % Reduction in Area: 58.8% % Reduction in Volume: -188.5% Epithelialization: None Wound Description Classification: Category/Stage IV Exudate Amount: Medium Exudate Type: Serosanguineous Exudate Color: red, brown Foul Odor After Cleansing: Monica Slough/Fibrino Yes Wound Bed Granulation Amount: None Present (0%) Exposed Structure Necrotic Amount: Large (67-100%) Fascia Exposed: Monica Necrotic Quality: Adherent Slough Fat Layer (Subcutaneous Tissue) Exposed: Yes Tendon Exposed: Monica Muscle Exposed: Monica Joint Exposed: Monica Bone Exposed: Monica Treatment Notes Wound #1 (Calcaneus) Wound Laterality: Right Cleanser Byram Ancillary Kit - 15 Day Supply Discharge Instruction: Use supplies as instructed; Kit contains: (15) Saline Bullets; (15) 3x3 Gauze; 15 pr Gloves Dakin 16 (oz) 0.25 [name], [name] ([id number])  [id number] Page 9 of 9 Discharge Instruction: Use as directed. Soap and Water Discharge Instruction: Gently cleanse wound with antibacterial soap, rinse and pat dry prior to dressing wounds Peri-Wound Care Topical Santyl Collagenase Ointment, 30 (gm), tube Discharge Instruction: apply nickel thick to wound bed only Primary Dressing Gauze Discharge Instruction: moistened with saline Secondary Dressing (BORDER) Zetuvit Plus SILICONE BORDER Dressing 4x4 (in/in) Discharge Instruction: Please do not put silicone bordered dressings under wraps. Use non-bordered dressing only. Secured With Compression Wrap Compression Stockings Facilities manager) Signed: 07/20/2022 5:16:05 PM By: Midge Aver MSN RN CNS WTA Entered By: Midge Aver on 07/18/2022 13:39:16 -------------------------------------------------------------------------------- Vitals Details Patient Name: Date of Service: [name], [name]. 07/18/2022 1:15 PM Medical Record Number: [id number] Patient Account Number: [id number] Date of Birth/Sex: Treating RN: [birth year] (89 y.o. [name] Primary Care Hena Ewalt: Bethann Punches Other Clinician: Referring Tashi Andujo: Treating Virgene Tirone/Extender: [name] in Treatment: 8 Vital Signs Time Taken: 13:25 Temperature (F): 97.7 Height (in): 62 Pulse (bpm): 99 Weight (lbs): 118 Respiratory Rate (breaths/min): 18 Body Mass Index (BMI): 21.6 Blood Pressure (mmHg): 131/83 Reference Range: 80 - 120 mg / dl Electronic Signature(s) Signed: 07/20/2022 5:16:05 PM By: Midge Aver MSN RN CNS WTA Entered By: Midge Aver on 07/18/2022 13:29:29

## 2022-07-25 ENCOUNTER — Encounter: Payer: Medicare Other | Admitting: Physician Assistant

## 2022-07-25 DIAGNOSIS — L89610 Pressure ulcer of right heel, unstageable: Secondary | ICD-10-CM | POA: Diagnosis not present

## 2022-07-25 NOTE — Progress Notes (Signed)
[name], [name] ([id number]) [id number] Page 1 of 9 Visit Report for 07/25/2022 Arrival Information Details Patient Name: Date of Service: [place], [name] 07/25/2022 1:15 PM Medical Record Number: [id number] Patient Account Number: [id number] Date of Birth/Sex: Treating RN: [birth year] (89 y.o. [name] Primary Care Hakop Humbarger: Bethann Punches Other Clinician: Referring Heylee Tant: Treating Edwards Mckelvie/Extender: [name] in Treatment: 9 Visit Information History Since Last Visit All ordered tests and consults were completed: No Patient Arrived: Ambulatory Added or deleted any medications: No Arrival Time: 13:24 Any new allergies or adverse reactions: No Accompanied By: sister Has Dressing in Place as Prescribed: Yes Transfer Assistance: None Pain Present Now: No Patient Identification Verified: Yes Secondary Verification Process Completed: Yes Patient Requires Transmission-Based Precautions: No Patient Has Alerts: Yes Patient Alerts: NOT Diabetic PAD Bilateral ABI L1.08TBI .70 05/25/22 ABI R .97 TBI .70 05/25/22 Electronic Signature(s) Signed: 07/25/2022 1:41:36 PM By: Midge Aver MSN RN CNS WTA Entered By: Midge Aver on 07/25/2022 13:41:36 -------------------------------------------------------------------------------- Clinic Level of Care Assessment Details Patient Name: Date of Service: [name], [name] 07/25/2022 1:15 PM Medical Record Number: [id number] Patient Account Number: [id number] Date of Birth/Sex: Treating RN: Oct 31, 1933 (89 y.o. [name] Primary Care Marajade Lei: Bethann Punches Other Clinician: Referring Yuepheng Schaller: Treating Brallan Denio/Extender: [name] in Treatment: 9 Clinic Level of Care Assessment Items TOOL 1 Quantity Score []  - 0 Use when EandM and Procedure is performed on INITIAL visit ASSESSMENTS - Nursing Assessment / Reassessment []  - 0 General Physical Exam (combine w/ comprehensive  assessment (listed just below) when performed on new pt. evals) []  - 0 Comprehensive Assessment (HX, ROS, Risk Assessments, Wounds Hx, etc.) ASSESSMENTS - Wound and Skin Assessment / Reassessment ELZORA, CULLINS ([id number]) [id number] Page 2 of 9 []  - 0 Dermatologic / Skin Assessment (not related to wound area) ASSESSMENTS - Ostomy and/or Continence Assessment and Care []  - 0 Incontinence Assessment and Management []  - 0 Ostomy Care Assessment and Management (repouching, etc.) PROCESS - Coordination of Care []  - 0 Simple Patient / Family Education for ongoing care []  - 0 Complex (extensive) Patient / Family Education for ongoing care []  - 0 Staff obtains Chiropractor, Records, T Results / Process Orders est []  - 0 Staff telephones HHA, Nursing Homes / Clarify orders / etc []  - 0 Routine Transfer to another Facility (non-emergent condition) []  - 0 Routine [facility] Admission (non-emergent condition) []  - 0 New Admissions / Manufacturing engineer / Ordering NPWT Apligraf, etc. , []  - 0 Emergency [facility] Admission (emergent condition) PROCESS - Special Needs []  - 0 Pediatric / Minor Patient Management []  - 0 Isolation Patient Management []  - 0 Hearing / Language / Visual special needs []  - 0 Assessment of Community assistance (transportation, D/C planning, etc.) []  - 0 Additional assistance / Altered mentation []  - 0 Support Surface(s) Assessment (bed, cushion, seat, etc.) INTERVENTIONS - Miscellaneous []  - 0 External ear exam []  - 0 Patient Transfer (multiple staff / Nurse, adult / Similar devices) []  - 0 Simple Staple / Suture removal (25 or less) []  - 0 Complex Staple / Suture removal (26 or more) []  - 0 Hypo/Hyperglycemic Management (do not check if billed separately) []  - 0 Ankle / Brachial Index (ABI) - do not check if billed separately Has the patient been seen at the [facility] within the last three years: Yes Total Score: 0 Level Of  Care: ____ Electronic Signature(s) Signed: 07/25/2022 2:53:57 PM By: Midge Aver MSN RN CNS WTA  Entered By: Midge Aver on 07/25/2022 14:07:56 -------------------------------------------------------------------------------- Encounter Discharge Information Details Patient Name: Date of Service: [name], [name] 07/25/2022 1:15 PM Medical Record Number: [id number] Patient Account Number: [id number] Date of Birth/Sex: Treating RN: [birth year] (89 y.o. [name] Primary Care Vincenzina Jagoda: Bethann Punches Other Clinician: Referring Emryn Flanery: Treating Arik Husmann/Extender: [name] in Treatment: [address] [place] J ([id number]) [id number] Page 3 of 9 Encounter Discharge Information Items Post Procedure Vitals Discharge Condition: Stable Temperature (F): 97.9 Ambulatory Status: Ambulatory Pulse (bpm): 104 Discharge Destination: Home Respiratory Rate (breaths/min): 18 Transportation: Private Auto Blood Pressure (mmHg): 116/96 Accompanied By: sister Schedule Follow-up Appointment: Yes Clinical Summary of Care: Electronic Signature(s) Signed: 07/25/2022 2:53:57 PM By: Midge Aver MSN RN CNS WTA Entered By: Midge Aver on 07/25/2022 14:24:22 -------------------------------------------------------------------------------- Lower Extremity Assessment Details Patient Name: Date of Service: [name], [name] 07/25/2022 1:15 PM Medical Record Number: [id number] Patient Account Number: [id number] Date of Birth/Sex: Treating RN: Jul 23, 1933 (89 y.o. [name] Primary Care [name]: Bethann Punches Other Clinician: Referring Heidemarie Goodnow: Treating Ricarda Atayde/Extender: [name] in Treatment: 9 Edema Assessment Assessed: [Left: No] [Right: No] [Left: Edema] [Right: :] Calf Left: Right: Point of Measurement: 32 cm From Medial Instep 32 cm Ankle Left: Right: Point of Measurement: 10 cm From Medial Instep 21 cm Electronic  Signature(s) Signed: 07/25/2022 1:41:52 PM By: Midge Aver MSN RN CNS WTA Entered By: Midge Aver on 07/25/2022 13:41:52 -------------------------------------------------------------------------------- Multi Wound Chart Details Patient Name: Date of Service: [name], [name] 07/25/2022 1:15 PM Medical Record Number: [id number] Patient Account Number: [id number] Date of Birth/Sex: Treating RN: 1933/12/13 (89 y.o. [name] Primary Care Jule Whitsel: Bethann Punches Other Clinician: ANGI, GOODELL ([id number]) [id number] Page 4 of 9 Referring Tharun Cappella: Treating Anselmo Reihl/Extender: [name] in Treatment: 9 Vital Signs Height(in): 62 Pulse(bpm): 104 Weight(lbs): 118 Blood Pressure(mmHg): 116/96 Body Mass Index(BMI): 21.6 Temperature(F): 97.9 Respiratory Rate(breaths/min): 18 [1:Photos:] [N/A:N/A] Right Calcaneus N/A N/A Wound Location: Pressure Injury N/A N/A Wounding Event: Pressure Ulcer N/A N/A Primary Etiology: Hypertension, Rheumatoid Arthritis N/A N/A Comorbid History: 03/25/2022 N/A N/A Date Acquired: 9 N/A N/A Weeks of Treatment: Open N/A N/A Wound Status: No N/A N/A Wound Recurrence: 1.2x1.3x0.8 N/A N/A Measurements L x W x D (cm) 1.225 N/A N/A A (cm) : rea 0.98 N/A N/A Volume (cm) : 38.80% N/A N/A % Reduction in A rea: -390.00% N/A N/A % Reduction in Volume: Category/Stage IV N/A N/A Classification: Medium N/A N/A Exudate A mount: Serosanguineous N/A N/A Exudate Type: red, brown N/A N/A Exudate Color: None Present (0%) N/A N/A Granulation A mount: Large (67-100%) N/A N/A Necrotic A mount: Fat Layer (Subcutaneous Tissue): Yes N/A N/A Exposed Structures: Fascia: No Tendon: No Muscle: No Joint: No Bone: No None N/A N/A Epithelialization: Treatment Notes Electronic Signature(s) Signed: 07/25/2022 2:53:57 PM By: Midge Aver MSN RN CNS WTA Previous Signature: 07/25/2022 1:42:28 PM Version By: Midge Aver MSN RN CNS WTA Entered By: Midge Aver on 07/25/2022 14:01:10 -------------------------------------------------------------------------------- Multi-Disciplinary Care Plan Details Patient Name: Date of Service: [name], [name] 07/25/2022 1:15 PM Medical Record Number: [id number] Patient Account Number: [id number] Date of Birth/Sex: Treating RN: 1934-01-03 (89 y.o. [name] Primary Care Hoke Baer: Bethann Punches Other Clinician: Referring Joanthony Hamza: Treating Amarianna Abplanalp/Extender: [name] in Treatment: [address] [place] J ([id number]) [id number] Page 5 of 9 Active Inactive Necrotic Tissue Nursing Diagnoses: Impaired tissue integrity related to necrotic/devitalized tissue Knowledge deficit related to management  of necrotic/devitalized tissue Goals: Necrotic/devitalized tissue will be minimized in the wound bed Date Initiated: 05/22/2022 Date Inactivated: 06/27/2022 Target Resolution Date: 06/22/2022 Goal Status: Met Patient/caregiver will verbalize understanding of reason and process for debridement of necrotic tissue Date Initiated: 05/22/2022 Target Resolution Date: 08/09/2022 Goal Status: Active Interventions: Assess patient pain level pre-, during and post procedure and prior to discharge Provide education on necrotic tissue and debridement process Treatment Activities: Apply topical anesthetic as ordered : 05/22/2022 Enzymatic debridement : 05/22/2022 Notes: Wound/Skin Impairment Nursing Diagnoses: Impaired tissue integrity Knowledge deficit related to ulceration/compromised skin integrity Goals: Patient/caregiver will verbalize understanding of skin care regimen Date Initiated: 05/22/2022 Date Inactivated: 06/27/2022 Target Resolution Date: 06/22/2022 Goal Status: Met Ulcer/skin breakdown will have a volume reduction of 30% by week 4 Date Initiated: 05/22/2022 Date Inactivated: 06/27/2022 Target Resolution Date:  06/22/2022 Goal Status: Met Ulcer/skin breakdown will have a volume reduction of 50% by week 8 Date Initiated: 05/22/2022 Date Inactivated: 07/18/2022 Target Resolution Date: 07/22/2022 Goal Status: Met Ulcer/skin breakdown will have a volume reduction of 80% by week 12 Date Initiated: 05/22/2022 Target Resolution Date: 08/22/2022 Goal Status: Active Ulcer/skin breakdown will heal within 14 weeks Date Initiated: 05/22/2022 Target Resolution Date: 09/05/2022 Goal Status: Active Interventions: Assess patient/caregiver ability to obtain necessary supplies Assess patient/caregiver ability to perform ulcer/skin care regimen upon admission and as needed Assess ulceration(s) every visit Provide education on ulcer and skin care Treatment Activities: Referred to DME Kreed Kauffman for dressing supplies : 05/22/2022 Skin care regimen initiated : 05/22/2022 Topical wound management initiated : 05/22/2022 Notes: Electronic Signature(s) Signed: 07/25/2022 2:53:57 PM By: Midge Aver MSN RN CNS WTA Entered By: Midge Aver on 07/25/2022 14:23:00 [employer] ([id number]) [id number] Page 6 of 9 -------------------------------------------------------------------------------- Pain Assessment Details Patient Name: Date of Service: [name], [name] 07/25/2022 1:15 PM Medical Record Number: [id number] Patient Account Number: [id number] Date of Birth/Sex: Treating RN: [birth year] (89 y.o. [name] Primary Care Kadrian Partch: Bethann Punches Other Clinician: Referring Fines Kimberlin: Treating Thorsten Climer/Extender: [name] in Treatment: 9 Active Problems Location of Pain Severity and Description of Pain Patient Has Paino No Site Locations Pain Management and Medication Current Pain Management: Electronic Signature(s) Signed: 07/25/2022 1:41:44 PM By: Midge Aver MSN RN CNS WTA Entered By: Midge Aver on 07/25/2022  13:41:44 -------------------------------------------------------------------------------- Patient/Caregiver Education Details Patient Name: Date of Service: [name], [name] 7/16/2024andnbsp1:15 PM Medical Record Number: [id number] Patient Account Number: [id number] Date of Birth/Gender: Treating RN: 12/13/1933 (89 y.o. [name] Primary Care Physician: Bethann Punches Other Clinician: Referring Physician: Treating Physician/Extender: [name] in Treatment: [address], [place] J ([id number]) [id number] Page 7 of 9 Education Assessment Education Provided To: Patient Education Topics Provided Wound/Skin Impairment: Handouts: Caring for Your Ulcer Methods: Explain/Verbal Responses: State content correctly Electronic Signature(s) Signed: 07/25/2022 2:53:57 PM By: Midge Aver MSN RN CNS WTA Entered By: Midge Aver on 07/25/2022 14:23:12 -------------------------------------------------------------------------------- Wound Assessment Details Patient Name: Date of Service: [name], [name] 07/25/2022 1:15 PM Medical Record Number: [id number] Patient Account Number: [id number] Date of Birth/Sex: Treating RN: 01-26-1933 (89 y.o. [name] Primary Care Eudell Julian: Bethann Punches Other Clinician: Referring Chasta Deshpande: Treating Lindzie Boxx/Extender: [name] in Treatment: 9 Wound Status Wound Number: 1 Primary Etiology: Pressure Ulcer Wound Location: Right Calcaneus Wound Status: Open Wounding Event: Pressure Injury Comorbid History: Hypertension, Rheumatoid Arthritis Date Acquired: 03/25/2022 Weeks Of Treatment: 9 Clustered Wound: No Photos Wound Measurements Length: (cm) 1.2 Width: (cm) 1.3 Depth: (  cm) 0.8 Area: (cm) 1.225 Volume: (cm) 0.98 % Reduction in Area: 38.8% % Reduction in Volume: -390% Epithelialization: None Wound Description Classification: Category/Stage IV Exudate Amount: Medium Exudate Type:  Serosanguineous Spittler, Tanayah J ([id number]) Exudate Color: red, brown Foul Odor After Cleansing: No Slough/Fibrino Yes [id number] Page 8 of 9 Wound Bed Granulation Amount: None Present (0%) Exposed Structure Necrotic Amount: Large (67-100%) Fascia Exposed: No Necrotic Quality: Adherent Slough Fat Layer (Subcutaneous Tissue) Exposed: Yes Tendon Exposed: No Muscle Exposed: No Joint Exposed: No Bone Exposed: No Treatment Notes Wound #1 (Calcaneus) Wound Laterality: Right Cleanser Byram Ancillary Kit - 15 Day Supply Discharge Instruction: Use supplies as instructed; Kit contains: (15) Saline Bullets; (15) 3x3 Gauze; 15 pr Gloves Dakin 16 (oz) 0.25 Discharge Instruction: Use as directed. Soap and Water Discharge Instruction: Gently cleanse wound with antibacterial soap, rinse and pat dry prior to dressing wounds Peri-Wound Care Topical Santyl Collagenase Ointment, 30 (gm), tube Discharge Instruction: apply nickel thick to wound bed only Primary Dressing Gauze Discharge Instruction: moistened with saline Secondary Dressing (BORDER) Zetuvit Plus SILICONE BORDER Dressing 4x4 (in/in) Discharge Instruction: Please do not put silicone bordered dressings under wraps. Use non-bordered dressing only. Secured With Compression Wrap Compression Stockings Facilities manager) Signed: 07/25/2022 2:53:57 PM By: Midge Aver MSN RN CNS WTA Entered By: Midge Aver on 07/25/2022 13:34:51 -------------------------------------------------------------------------------- Vitals Details Patient Name: Date of Service: [name], [name] 07/25/2022 1:15 PM Medical Record Number: [id number] Patient Account Number: [id number] Date of Birth/Sex: Treating RN: 11/17/1933 (89 y.o. [name] Primary Care Veronica Fretz: Bethann Punches Other Clinician: Referring Zell Doucette: Treating Margaret Cockerill/Extender: [name] in Treatment: 9 Vital Signs Time Taken:  13:26 Temperature (F): 97.9 ADRIAHNA, SHEARMAN ([id number]) [id number] Page 9 of 9 Height (in): 62 Pulse (bpm): 104 Weight (lbs): 118 Respiratory Rate (breaths/min): 18 Body Mass Index (BMI): 21.6 Blood Pressure (mmHg): 116/96 Reference Range: 80 - 120 mg / dl Electronic Signature(s) Signed: 07/25/2022 1:41:40 PM By: Midge Aver MSN RN CNS WTA Entered By: Midge Aver on 07/25/2022 13:41:40

## 2022-07-25 NOTE — Progress Notes (Signed)
ASA, FATH ([id number]) [id number] Page 1 of 7 Visit Report for 07/25/2022 Chief Complaint Document Details Patient Name: Date of Service: [facility], Monica RMA J. 07/25/2022 1:15 PM Medical Record Number: [id number] Patient Account Number: [id number] Date of Birth/Sex: Treating RN: 03/09/1933 (89 y.o. [name] Primary Care Provider: Bethann Punches Other Clinician: Referring Provider: Treating Provider/Extender: [name] in Treatment: 9 Information Obtained from: Patient Chief Complaint Right heel pressure ulcer Electronic Signature(s) Signed: 07/25/2022 1:32:37 PM By: Allen Derry PA-C Entered By: Allen Derry on 07/25/2022 13:32:37 -------------------------------------------------------------------------------- Debridement Details Patient Name: Date of Service: [name], Monica RMA J. 07/25/2022 1:15 PM Medical Record Number: [id number] Patient Account Number: [id number] Date of Birth/Sex: Treating RN: Mar 24, 1933 (89 y.o. [name] Primary Care Provider: Bethann Punches Other Clinician: Referring Provider: Treating Provider/Extender: [name] in Treatment: 9 Debridement Performed for Assessment: Wound #1 Right Calcaneus Performed By: Physician Allen Derry, PA-C Debridement Type: Debridement Level of Consciousness (Pre-procedure): Awake and Alert Pre-procedure Verification/Time Out Yes - 14:02 Taken: Start Time: 14:02 Pain Control: Lidocaine 4% T opical Solution Percent of Wound Bed Debrided: 100% T Area Debrided (cm): otal 1.22 Tissue and other material debrided: Viable, Non-Viable, Callus, Slough, Subcutaneous, Slough Level: Skin/Subcutaneous Tissue Debridement Description: Excisional Instrument: Curette Bleeding: Minimum Hemostasis Achieved: Pressure Procedural Pain: 2 Post Procedural Pain: 2 Response to Treatment: Procedure was tolerated well [name], [name] ([id number])  [id number] Page 2 of 7 Level of Consciousness (Post- Awake and Alert procedure): Post Debridement Measurements of Total Wound Length: (cm) 1.2 Stage: Category/Stage IV Width: (cm) 1.3 Depth: (cm) 0.2 Volume: (cm) 0.245 Character of Wound/Ulcer Post Debridement: Stable Post Procedure Diagnosis Same as Pre-procedure Electronic Signature(s) Signed: 07/25/2022 2:53:57 PM By: Midge Aver MSN RN CNS WTA Signed: 07/25/2022 5:59:11 PM By: Allen Derry PA-C Entered By: Midge Aver on 07/25/2022 14:03:28 -------------------------------------------------------------------------------- HPI Details Patient Name: Date of Service: [name], Monica RMA J. 07/25/2022 1:15 PM Medical Record Number: [id number] Patient Account Number: [id number] Date of Birth/Sex: Treating RN: 11-02-1933 (89 y.o. [name] Primary Care Provider: Bethann Punches Other Clinician: Referring Provider: Treating Provider/Extender: [name] in Treatment: 9 History of Present Illness HPI Description: 05-22-2022 upon evaluation today patient appears to be having issues with the wound on her right heel. This actually occurred during a surgical intervention for her left knee she had a knee replacement. She went in with Monica issues and when she got home or even while she was staying overnight before being discharged home noted to have an issue with a discolored area on her right heel. She is unsure how this happened but knows it was not present prior to the surgery. She has been monitoring since it is now an eschar covered region that obviously is going to need to be removed in order to allow this to heal. With that being said her ABIs were not registering very well NuShield today it appears that she may have a issue with good arterial flow this could be somewhat accounting for what is going on with the heel. Nonetheless I do think she is probably going to be seen by a vein and vascular for  arterial studies. She had the surgery on Friday and came home on a Saturday. This was a very short stay. Patient does have a history of rheumatoid arthritis but really Monica other major medical problems. 05-29-2022 upon evaluation today patient appears to be doing well currently in regard to her wound. In fact  she is telling me that she is doing quite well at this point. Fortunately there does not appear to be any signs of active infection locally nor systemically at this time which is great news. Monica fevers, chills, nausea, vomiting, or diarrhea. The good news that she did get the Santyl although it took until Friday. 06-15-2022 upon evaluation today patient appears to be doing well currently in regard to her heel ulcer. We are slowly getting this to loosen up and clearway. Fortunately I do not see any signs of active infection at this time which is great news I think that we are definitely ready to clear away some of the necrotic debris today. 06-20-2022 upon evaluation today patient's wound is actually showing signs of good improvement with regard to the heel. She has been tolerating the dressing changes without complication and currently I am going to recommend that we go ahead and continue with the plan using the Santyl to help clean up the surface of this wound. Really she seems to be doing quite well with that. 06-27-2022 upon evaluation patient actually appears to be doing better she is slowly showing signs of improvement. With that being said the wound is a bit deeper but this is only because we are getting to the base of the wound as far as cleaning out the necrotic tissue is concerned. Fortunately I think the Santyl is loosening things up so we can actually do this. I think we are going to switch to using Dakin's moistened gauze over top of the Santyl as of today. 07-04-2022 upon evaluation today patient appears to be doing well currently in regard to her wound. She has been tolerating the dressing  changes without complication. Fortunately I do not see any signs of active infection at this time which is great news and in general I do believe that removing in the appropriate direction here. She is going to require some debridement today. 07-18-2022 upon evaluation today patient appears to be doing well currently in regard to her wound. In fact this is showing signs of improvement which is great news. Fortunately I do not see any evidence of active infection locally or systemically which is great news. Monica fevers, chills, nausea, vomiting, or diarrhea. 07-25-2022 upon evaluation today patient appears to be doing well currently in regard to her heel. She is slowly making good progress towards complete closure which is excellent news. I do not see any signs of active infection at this time which is great news as well. [name], [name] ([id number]) [id number] Page 3 of 7 Electronic Signature(s) Signed: 07/26/2022 4:41:00 PM By: Allen Derry PA-C Entered By: Allen Derry on 07/26/2022 16:41:00 -------------------------------------------------------------------------------- Physical Exam Details Patient Name: Date of Service: [name], Monica RMA J. 07/25/2022 1:15 PM Medical Record Number: [id number] Patient Account Number: [id number] Date of Birth/Sex: Treating RN: 1933/01/12 (89 y.o. [name] Primary Care Provider: Bethann Punches Other Clinician: Referring Provider: Treating Provider/Extender: [name] in Treatment: 9 Constitutional Well-nourished and well-hydrated in Monica acute distress. Respiratory normal breathing without difficulty. Psychiatric this patient is able to make decisions and demonstrates good insight into disease process. Alert and Oriented x 3. pleasant and cooperative. Notes Upon inspection patient's wound bed actually showed signs of need for sharp debridement I did perform debridement today to clearway some of the necrotic debris  she tolerated this without complication and postdebridement wound bed appears to be doing much better which is great news. Electronic Signature(s) Signed: 07/26/2022 4:41:21 PM By:  Allen Derry PA-C Entered By: Allen Derry on 07/26/2022 16:41:21 -------------------------------------------------------------------------------- Physician Orders Details Patient Name: Date of Service: [name], Monica RMA J. 07/25/2022 1:15 PM Medical Record Number: [id number] Patient Account Number: [id number] Date of Birth/Sex: Treating RN: 02/11/1933 (89 y.o. [name] Primary Care Provider: Bethann Punches Other Clinician: Referring Provider: Treating Provider/Extender: [name] in Treatment: 9 Verbal / Phone Orders: Monica Diagnosis Coding ICD-10 Coding Code Description L89.610 Pressure ulcer of right heel, unstageable M05.80 Other rheumatoid arthritis with rheumatoid factor of unspecified site [name], [name] ([id number]) [id number] Page 4 of 7 Follow-up Appointments Return Appointment in 1 week. Bathing/ [employer] wounds with antibacterial soap and water. Doylene Canning dial Anesthetic (Use 'Patient Medications' Section for Anesthetic Order Entry) Lidocaine applied to wound bed Wound Treatment Wound #1 - Calcaneus Wound Laterality: Right Cleanser: Byram Ancillary Kit - 15 Day Supply 1 x Per Day/30 Days Discharge Instructions: Use supplies as instructed; Kit contains: (15) Saline Bullets; (15) 3x3 Gauze; 15 pr Gloves Cleanser: Dakin 16 (oz) 0.25 1 x Per Day/30 Days Discharge Instructions: Use as directed. Cleanser: Soap and Water 1 x Per Day/30 Days Discharge Instructions: Gently cleanse wound with antibacterial soap, rinse and pat dry prior to dressing wounds Topical: Santyl Collagenase Ointment, 30 (gm), tube 1 x Per Day/30 Days Discharge Instructions: apply nickel thick to wound bed only Prim Dressing: Gauze (Generic) 1 x Per Day/30  Days ary Discharge Instructions: moistened with saline Secondary Dressing: (BORDER) Zetuvit Plus SILICONE BORDER Dressing 4x4 (in/in) (Generic) 1 x Per Day/30 Days Discharge Instructions: Please do not put silicone bordered dressings under wraps. Use non-bordered dressing only. Electronic Signature(s) Signed: 07/25/2022 2:53:57 PM By: Midge Aver MSN RN CNS WTA Signed: 07/25/2022 5:59:11 PM By: Allen Derry PA-C Entered By: Midge Aver on 07/25/2022 14:07:45 -------------------------------------------------------------------------------- Problem List Details Patient Name: Date of Service: [name], Monica RMA J. 07/25/2022 1:15 PM Medical Record Number: [id number] Patient Account Number: [id number] Date of Birth/Sex: Treating RN: [birth year] (89 y.o. [name] Primary Care Provider: Bethann Punches Other Clinician: Referring Provider: Treating Provider/Extender: [name] in Treatment: 9 Active Problems ICD-10 Encounter Code Description Active Date MDM Diagnosis L89.610 Pressure ulcer of right heel, unstageable 05/22/2022 Monica Yes M05.80 Other rheumatoid arthritis with rheumatoid factor of unspecified site 05/22/2022 Monica Yes Inactive Problems [name], [name] ([id number]) [id number] Page 5 of 7 Resolved Problems Electronic Signature(s) Signed: 07/25/2022 1:32:30 PM By: Allen Derry PA-C Entered By: Allen Derry on 07/25/2022 13:32:30 -------------------------------------------------------------------------------- Progress Note Details Patient Name: Date of Service: [name], Monica RMA J. 07/25/2022 1:15 PM Medical Record Number: [id number] Patient Account Number: [id number] Date of Birth/Sex: Treating RN: [birth year] (89 y.o. [name] Primary Care Provider: Bethann Punches Other Clinician: Referring Provider: Treating Provider/Extender: [name] in Treatment: 9 Subjective Chief Complaint Information obtained from  Patient Right heel pressure ulcer History of Present Illness (HPI) 05-22-2022 upon evaluation today patient appears to be having issues with the wound on her right heel. This actually occurred during a surgical intervention for her left knee she had a knee replacement. She went in with Monica issues and when she got home or even while she was staying overnight before being discharged home noted to have an issue with a discolored area on her right heel. She is unsure how this happened but knows it was not present prior to the surgery. She has been monitoring since it is now an eschar covered region that obviously is  going to need to be removed in order to allow this to heal. With that being said her ABIs were not registering very well NuShield today it appears that she may have a issue with good arterial flow this could be somewhat accounting for what is going on with the heel. Nonetheless I do think she is probably going to be seen by a vein and vascular for arterial studies. She had the surgery on Friday and came home on a Saturday. This was a very short stay. Patient does have a history of rheumatoid arthritis but really Monica other major medical problems. 05-29-2022 upon evaluation today patient appears to be doing well currently in regard to her wound. In fact she is telling me that she is doing quite well at this point. Fortunately there does not appear to be any signs of active infection locally nor systemically at this time which is great news. Monica fevers, chills, nausea, vomiting, or diarrhea. The good news that she did get the Santyl although it took until Friday. 06-15-2022 upon evaluation today patient appears to be doing well currently in regard to her heel ulcer. We are slowly getting this to loosen up and clearway. Fortunately I do not see any signs of active infection at this time which is great news I think that we are definitely ready to clear away some of the necrotic debris today. 06-20-2022  upon evaluation today patient's wound is actually showing signs of good improvement with regard to the heel. She has been tolerating the dressing changes without complication and currently I am going to recommend that we go ahead and continue with the plan using the Santyl to help clean up the surface of this wound. Really she seems to be doing quite well with that. 06-27-2022 upon evaluation patient actually appears to be doing better she is slowly showing signs of improvement. With that being said the wound is a bit deeper but this is only because we are getting to the base of the wound as far as cleaning out the necrotic tissue is concerned. Fortunately I think the Santyl is loosening things up so we can actually do this. I think we are going to switch to using Dakin's moistened gauze over top of the Santyl as of today. 07-04-2022 upon evaluation today patient appears to be doing well currently in regard to her wound. She has been tolerating the dressing changes without complication. Fortunately I do not see any signs of active infection at this time which is great news and in general I do believe that removing in the appropriate direction here. She is going to require some debridement today. 07-18-2022 upon evaluation today patient appears to be doing well currently in regard to her wound. In fact this is showing signs of improvement which is great news. Fortunately I do not see any evidence of active infection locally or systemically which is great news. Monica fevers, chills, nausea, vomiting, or diarrhea. 07-25-2022 upon evaluation today patient appears to be doing well currently in regard to her heel. She is slowly making good progress towards complete closure which is excellent news. I do not see any signs of active infection at this time which is great news as well. [address] JADAMARIE, [name] ([id number]) [id number] Page 6 of 7 Constitutional Well-nourished and  well-hydrated in Monica acute distress. Vitals Time Taken: 1:26 PM, Height: 62 in, Weight: 118 lbs, BMI: 21.6, Temperature: 97.9 F, Pulse: 104 bpm, Respiratory Rate: 18 breaths/min, Blood Pressure: 116/96 mmHg. Respiratory normal breathing  without difficulty. Psychiatric this patient is able to make decisions and demonstrates good insight into disease process. Alert and Oriented x 3. pleasant and cooperative. General Notes: Upon inspection patient's wound bed actually showed signs of need for sharp debridement I did perform debridement today to clearway some of the necrotic debris she tolerated this without complication and postdebridement wound bed appears to be doing much better which is great news. Integumentary (Hair, Skin) Wound #1 status is Open. Original cause of wound was Pressure Injury. The date acquired was: 03/25/2022. The wound has been in treatment 9 weeks. The wound is located on the Right Calcaneus. The wound measures 1.2cm length x 1.3cm width x 0.8cm depth; 1.225cm^2 area and 0.98cm^3 volume. There is Fat Layer (Subcutaneous Tissue) exposed. There is a medium amount of serosanguineous drainage noted. There is Monica granulation within the wound bed. There is a large (67-100%) amount of necrotic tissue within the wound bed including Adherent Slough. Assessment Active Problems ICD-10 Pressure ulcer of right heel, unstageable Other rheumatoid arthritis with rheumatoid factor of unspecified site Procedures Wound #1 Pre-procedure diagnosis of Wound #1 is a Pressure Ulcer located on the Right Calcaneus . There was a Excisional Skin/Subcutaneous Tissue Debridement with a total area of 1.22 sq cm performed by Allen Derry, PA-C. With the following instrument(s): Curette to remove Viable and Non-Viable tissue/material. Material removed includes Callus, Subcutaneous Tissue, and Slough after achieving pain control using Lidocaine 4% T opical Solution. Monica specimens were taken. A time out was  conducted at 14:02, prior to the start of the procedure. A Minimum amount of bleeding was controlled with Pressure. The procedure was tolerated well with a pain level of 2 throughout and a pain level of 2 following the procedure. Post Debridement Measurements: 1.2cm length x 1.3cm width x 0.2cm depth; 0.245cm^3 volume. Post debridement Stage noted as Category/Stage IV. Character of Wound/Ulcer Post Debridement is stable. Post procedure Diagnosis Wound #1: Same as Pre-Procedure Plan Follow-up Appointments: Return Appointment in 1 week. Bathing/ Shower/ Hygiene: Wash wounds with antibacterial soap and water. Doylene Canning dial Anesthetic (Use 'Patient Medications' Section for Anesthetic Order Entry): Lidocaine applied to wound bed WOUND #1: - Calcaneus Wound Laterality: Right Cleanser: Byram Ancillary Kit - 15 Day Supply 1 x Per Day/30 Days Discharge Instructions: Use supplies as instructed; Kit contains: (15) Saline Bullets; (15) 3x3 Gauze; 15 pr Gloves Cleanser: Dakin 16 (oz) 0.25 1 x Per Day/30 Days Discharge Instructions: Use as directed. Cleanser: Soap and Water 1 x Per Day/30 Days Discharge Instructions: Gently cleanse wound with antibacterial soap, rinse and pat dry prior to dressing wounds Topical: Santyl Collagenase Ointment, 30 (gm), tube 1 x Per Day/30 Days Discharge Instructions: apply nickel thick to wound bed only Prim Dressing: Gauze (Generic) 1 x Per Day/30 Days ary Discharge Instructions: moistened with saline Secondary Dressing: (BORDER) Zetuvit Plus SILICONE BORDER Dressing 4x4 (in/in) (Generic) 1 x Per Day/30 Days Discharge Instructions: Please do not put silicone bordered dressings under wraps. Use non-bordered dressing only. 1. I am going to recommend that we have the patient continue to monitor for any evidence of infection or worsening. Based on what I am seeing I do think that she is really doing well with regard to the heel I think we are getting very close to complete  resolution. 2. I am also can recommend that she should continue to offload the heels she is doing a great job with this. 3 we will also continue with the Santyl followed by the Dakin's moistened gauze  which I think is really helping to clean this up quite nicely. We will see patient back for reevaluation in 1 week here in the clinic. If anything worsens or changes patient will contact our office for additional recommendations. [name], [name] ([id number]) [id number] Page 7 of 7 Electronic Signature(s) Signed: 07/26/2022 4:41:45 PM By: Allen Derry PA-C Entered By: Allen Derry on 07/26/2022 16:41:45 -------------------------------------------------------------------------------- SuperBill Details Patient Name: Date of Service: [name], Monica RMA J. 07/25/2022 Medical Record Number: [id number] Patient Account Number: [id number] Date of Birth/Sex: Treating RN: 03/20/1933 (89 y.o. [name] Primary Care Provider: Bethann Punches Other Clinician: Referring Provider: Treating Provider/Extender: [name] in Treatment: 9 Diagnosis Coding ICD-10 Codes Code Description L89.610 Pressure ulcer of right heel, unstageable M05.80 Other rheumatoid arthritis with rheumatoid factor of unspecified site Facility Procedures : CPT4 Code: [id number] Description: 11042 - DEB SUBQ TISSUE 20 SQ CM/< ICD-10 Diagnosis Description L89.610 Pressure ulcer of right heel, unstageable Modifier: Quantity: 1 Physician Procedures : CPT4 Code Description Modifier [id number] 11042 - WC PHYS SUBQ TISS 20 SQ CM ICD-10 Diagnosis Description L89.610 Pressure ulcer of right heel, unstageable Quantity: 1 Electronic Signature(s) Signed: 07/25/2022 5:47:59 PM By: Allen Derry PA-C Entered By: Allen Derry on 07/25/2022 17:47:59

## 2022-08-01 ENCOUNTER — Encounter: Payer: Medicare Other | Admitting: Internal Medicine

## 2022-08-01 DIAGNOSIS — L89610 Pressure ulcer of right heel, unstageable: Secondary | ICD-10-CM | POA: Diagnosis not present

## 2022-08-03 NOTE — Progress Notes (Signed)
Monica, Robertson ([id number]) [id number] Page 1 of 7 Visit Report for 08/01/2022 Debridement Details Patient Name: Date of Service: [facility], Monica RMA J. 08/01/2022 1:15 PM Medical Record Number: [id number] Patient Account Number: [id number] Date of Birth/Sex: Treating RN: [birth year] (89 y.o. [name] Primary Care Provider: Bethann Punches Other Clinician: Referring Provider: Treating Provider/Extender: Chauncey Mann, MICHA EL Mila Homer in Treatment: 10 Debridement Performed for Assessment: Wound #1 Right Calcaneus Performed By: Physician Maxwell Caul, MD Debridement Type: Debridement Level of Consciousness (Pre-procedure): Awake and Alert Pre-procedure Verification/Time Out Yes - 14:04 Taken: Start Time: 14:04 Percent of Wound Bed Debrided: 100% T Area Debrided (cm): otal 0.52 Tissue and other material debrided: Viable, Non-Viable, Slough, Subcutaneous, Slough Level: Skin/Subcutaneous Tissue Debridement Description: Excisional Instrument: Curette Bleeding: Minimum Hemostasis Achieved: Pressure Procedural Pain: 0 Post Procedural Pain: 0 Response to Treatment: Procedure was tolerated well Level of Consciousness (Post- Awake and Alert procedure): Post Debridement Measurements of Total Wound Length: (cm) 0.6 Stage: Category/Stage IV Width: (cm) 1.1 Depth: (cm) 0.6 Volume: (cm) 0.311 Character of Wound/Ulcer Post Debridement: Stable Post Procedure Diagnosis Same as Pre-procedure Electronic Signature(s) Signed: 08/01/2022 4:43:46 PM By: Baltazar Najjar MD Signed: 08/03/2022 4:43:06 PM By: Midge Aver MSN RN CNS WTA Entered By: Midge Aver on 08/01/2022 14:07:27 HPI Details -------------------------------------------------------------------------------- [name] ([id number]) [id number] Page 2 of 7 Patient Name: Date of Service: [name], Monica RMA J. 08/01/2022 1:15 PM Medical Record Number: [id number] Patient  Account Number: [id number] Date of Birth/Sex: Treating RN: 1933/05/16 (89 y.o. [name] Primary Care Provider: Bethann Punches Other Clinician: Referring Provider: Treating Provider/Extender: RO BSO N, MICHA EL Mila Homer in Treatment: 10 History of Present Illness HPI Description: 05-22-2022 upon evaluation today patient appears to be having issues with the wound on her right heel. This actually occurred during a surgical intervention for her left knee she had a knee replacement. She went in with Monica issues and when she got home or even while she was staying overnight before being discharged home noted to have an issue with a discolored area on her right heel. She is unsure how this happened but knows it was not present prior to the surgery. She has been monitoring since it is now an eschar covered region that obviously is going to need to be removed in order to allow this to heal. With that being said her ABIs were not registering very well NuShield today it appears that she may have a issue with good arterial flow this could be somewhat accounting for what is going on with the heel. Nonetheless I do think she is probably going to be seen by a vein and vascular for arterial studies. She had the surgery on Friday and came home on a Saturday. This was a very short stay. Patient does have a history of rheumatoid arthritis but really Monica other major medical problems. 05-29-2022 upon evaluation today patient appears to be doing well currently in regard to her wound. In fact she is telling me that she is doing quite well at this point. Fortunately there does not appear to be any signs of active infection locally nor systemically at this time which is great news. Monica fevers, chills, nausea, vomiting, or diarrhea. The good news that she did get the Santyl although it took until Friday. 06-15-2022 upon evaluation today patient appears to be doing well currently in regard to her heel ulcer. We are  slowly getting this to loosen up and  clearway. Fortunately I do not see any signs of active infection at this time which is great news I think that we are definitely ready to clear away some of the necrotic debris today. 06-20-2022 upon evaluation today patient's wound is actually showing signs of good improvement with regard to the heel. She has been tolerating the dressing changes without complication and currently I am going to recommend that we go ahead and continue with the plan using the Santyl to help clean up the surface of this wound. Really she seems to be doing quite well with that. 06-27-2022 upon evaluation patient actually appears to be doing better she is slowly showing signs of improvement. With that being said the wound is a bit deeper but this is only because we are getting to the base of the wound as far as cleaning out the necrotic tissue is concerned. Fortunately I think the Santyl is loosening things up Monica we can actually do this. I think we are going to switch to using Dakin's moistened gauze over top of the Santyl as of today. 07-04-2022 upon evaluation today patient appears to be doing well currently in regard to her wound. She has been tolerating the dressing changes without complication. Fortunately I do not see any signs of active infection at this time which is great news and in general I do believe that removing in the appropriate direction here. She is going to require some debridement today. 07-18-2022 upon evaluation today patient appears to be doing well currently in regard to her wound. In fact this is showing signs of improvement which is great news. Fortunately I do not see any evidence of active infection locally or systemically which is great news. Monica fevers, chills, nausea, vomiting, or diarrhea. 07-25-2022 upon evaluation today patient appears to be doing well currently in regard to her heel. She is slowly making good progress towards complete closure which is  excellent news. I do not see any signs of active infection at this time which is great news as well. 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 We have been using Santyl Dakin's gauze Zetuvit and a border foam. She is making nice Insurance claims handler) Signed: 08/01/2022 4:43:46 PM By: Baltazar Najjar MD Entered By: Baltazar Najjar on 08/01/2022 14:11:20 -------------------------------------------------------------------------------- Physical Exam Details Patient Name: Date of Service: [name], Monica RMA J. 08/01/2022 1:15 PM Medical Record Number: [id number] Patient Account Number: [id number] Date of Birth/Sex: Treating RN: 08-12-1933 (89 y.o. [name] Primary Care Provider: Bethann Punches Other Clinician: Referring Provider: Treating Provider/Extender: RO BSO N, MICHA EL [name], [name] in Treatment: 10 Constitutional Sitting or standing Blood Pressure is within target range for patient.. Pulse regular and within target range for patient.Marland Kitchen Respirations regular, non-labored and within target range.. Temperature is normal and within the target range for the patient.Marland Kitchen appears in Monica distress. [name], [name] ([id number]) [id number] Page 3 of 7 Notes Wound exam; near the tip of the right heel. Small wound however with some relative depth. Fibrinous surface required mechanical debridement with a #3 curette hemostasis with direct pressure cleans up fairly nicely. Monica evidence of surrounding infection there is Monica bone palpable Electronic Signature(s) Signed: 08/01/2022 4:43:46 PM By: Baltazar Najjar MD Entered By: Baltazar Najjar on 08/01/2022 14:12:16 -------------------------------------------------------------------------------- Physician Orders Details Patient Name: Date of Service: [name], Monica RMA J. 08/01/2022 1:15 PM Medical Record Number: [id number] Patient Account Number: [id number] Date of  Birth/Sex: Treating RN: 08-16-1933 (  89 y.o. [name] Primary Care Provider: Bethann Punches Other Clinician: Referring Provider: Treating Provider/Extender: RO BSO N, MICHA EL Mila Homer in Treatment: 10 Verbal / Phone Orders: Monica Diagnosis Coding Follow-up Appointments Return Appointment in 1 week. Bathing/ [employer] wounds with antibacterial soap and water. Doylene Canning dial Anesthetic (Use 'Patient Medications' Section for Anesthetic Order Entry) Lidocaine applied to wound bed Wound Treatment Wound #1 - Calcaneus Wound Laterality: Right Cleanser: Byram Ancillary Kit - 15 Day Supply 1 x Per Day/30 Days Discharge Instructions: Use supplies as instructed; Kit contains: (15) Saline Bullets; (15) 3x3 Gauze; 15 pr Gloves Cleanser: Soap and Water 1 x Per Day/30 Days Discharge Instructions: Gently cleanse wound with antibacterial soap, rinse and pat dry prior to dressing wounds Topical: Santyl Collagenase Ointment, 30 (gm), tube 1 x Per Day/30 Days Discharge Instructions: apply nickel thick to wound bed only Prim Dressing: Gauze (Generic) 1 x Per Day/30 Days ary Discharge Instructions: moistened with saline Secondary Dressing: (BORDER) Zetuvit Plus SILICONE BORDER Dressing 4x4 (in/in) (Generic) 1 x Per Day/30 Days Discharge Instructions: Please do not put silicone bordered dressings under wraps. Use non-bordered dressing only. Electronic Signature(s) Signed: 08/01/2022 4:43:46 PM By: Baltazar Najjar MD Signed: 08/03/2022 4:43:06 PM By: Midge Aver MSN RN CNS WTA Entered By: Midge Aver on 08/01/2022 14:08:17 [name] ([id number]) [id number] Page 4 of 7 -------------------------------------------------------------------------------- Problem List Details Patient Name: Date of Service: [name], Monica RMA J. 08/01/2022 1:15 PM Medical Record Number: [id number] Patient Account Number: [id number] Date of Birth/Sex: Treating RN: 12-Sep-1933 (89 y.o.  [name] Primary Care Provider: Bethann Punches Other Clinician: Referring Provider: Treating Provider/Extender: RO BSO Dorris Carnes, MICHA EL [name], [name] in Treatment: 10 Active Problems ICD-10 Encounter Code Description Active Date MDM Diagnosis L89.610 Pressure ulcer of right heel, unstageable 05/22/2022 Monica Yes M05.80 Other rheumatoid arthritis with rheumatoid factor of unspecified site 05/22/2022 Monica Yes Inactive Problems Resolved Problems Electronic Signature(s) Signed: 08/01/2022 4:43:46 PM By: Baltazar Najjar MD Entered By: Baltazar Najjar on 08/01/2022 14:10:04 -------------------------------------------------------------------------------- Progress Note Details Patient Name: Date of Service: [name], Monica RMA J. 08/01/2022 1:15 PM Medical Record Number: [id number] Patient Account Number: [id number] Date of Birth/Sex: Treating RN: May 13, 1933 (89 y.o. [name] Primary Care Provider: Bethann Punches Other Clinician: Referring Provider: Treating Provider/Extender: Chauncey Mann, MICHA EL Mila Homer in Treatment: 10 Subjective History of Present Illness (HPI) 05-22-2022 upon evaluation today patient appears to be having issues with the wound on her right heel. This actually occurred during a surgical intervention for her left knee she had a knee replacement. She went in with Monica issues and when she got home or even while she was staying overnight before being discharged home noted to have an issue with a discolored area on her right heel. She is unsure how this happened but knows it was not present prior to the surgery. She has been monitoring since it is now an eschar covered region that obviously is going to need to be removed in order to allow this to heal. With that being said her ABIs were not registering very well NuShield today it appears that she may have a issue with good arterial flow this could be somewhat accounting for what is going on with the heel. Nonetheless I  do think she is probably going to be seen by a vein and vascular for arterial studies. She had the surgery on Friday [name], [name] ([id number]) [id number] Page 5 of  7 and came home on a Saturday. This was a very short stay. Patient does have a history of rheumatoid arthritis but really Monica other major medical problems. 05-29-2022 upon evaluation today patient appears to be doing well currently in regard to her wound. In fact she is telling me that she is doing quite well at this point. Fortunately there does not appear to be any signs of active infection locally nor systemically at this time which is great news. Monica fevers, chills, nausea, vomiting, or diarrhea. The good news that she did get the Santyl although it took until Friday. 06-15-2022 upon evaluation today patient appears to be doing well currently in regard to her heel ulcer. We are slowly getting this to loosen up and clearway. Fortunately I do not see any signs of active infection at this time which is great news I think that we are definitely ready to clear away some of the necrotic debris today. 06-20-2022 upon evaluation today patient's wound is actually showing signs of good improvement with regard to the heel. She has been tolerating the dressing changes without complication and currently I am going to recommend that we go ahead and continue with the plan using the Santyl to help clean up the surface of this wound. Really she seems to be doing quite well with that. 06-27-2022 upon evaluation patient actually appears to be doing better she is slowly showing signs of improvement. With that being said the wound is a bit deeper but this is only because we are getting to the base of the wound as far as cleaning out the necrotic tissue is concerned. Fortunately I think the Santyl is loosening things up Monica we can actually do this. I think we are going to switch to using Dakin's moistened gauze over top of the Santyl as  of today. 07-04-2022 upon evaluation today patient appears to be doing well currently in regard to her wound. She has been tolerating the dressing changes without complication. Fortunately I do not see any signs of active infection at this time which is great news and in general I do believe that removing in the appropriate direction here. She is going to require some debridement today. 07-18-2022 upon evaluation today patient appears to be doing well currently in regard to her wound. In fact this is showing signs of improvement which is great news. Fortunately I do not see any evidence of active infection locally or systemically which is great news. Monica fevers, chills, nausea, vomiting, or diarrhea. 07-25-2022 upon evaluation today patient appears to be doing well currently in regard to her heel. She is slowly making good progress towards complete closure which is excellent news. I do not see any signs of active infection at this time which is great news as well. 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 We have been using Santyl Dakin's gauze Zetuvit and a border foam. She is making nice progress Objective Constitutional Sitting or standing Blood Pressure is within target range for patient.. Pulse regular and within target range for patient.Marland Kitchen Respirations regular, non-labored and within target range.. Temperature is normal and within the target range for the patient.Marland Kitchen appears in Monica distress. Vitals Time Taken: 1:41 PM, Height: 62 in, Weight: 118 lbs, BMI: 21.6, Temperature: 97.5 F, Pulse: 98 bpm, Respiratory Rate: 18 breaths/min, Blood Pressure: 116/68 mmHg. General Notes: Wound exam; near the tip of the right heel. Small wound however with some relative depth. Fibrinous surface required mechanical  debridement with a #3 curette hemostasis with direct pressure cleans up fairly nicely. Monica evidence of surrounding infection there is Monica bone  palpable Integumentary (Hair, Skin) Wound #1 status is Open. Original cause of wound was Pressure Injury. The date acquired was: 03/25/2022. The wound has been in treatment 10 Robertson. The wound is located on the Right Calcaneus. The wound measures 0.6cm length x 1.1cm width x 0.6cm depth; 0.518cm^2 area and 0.311cm^3 volume. There is Fat Layer (Subcutaneous Tissue) exposed. There is a medium amount of serosanguineous drainage noted. There is Monica granulation within the wound bed. There is a large (67-100%) amount of necrotic tissue within the wound bed including Adherent Slough. Assessment Active Problems ICD-10 Pressure ulcer of right heel, unstageable Other rheumatoid arthritis with rheumatoid factor of unspecified site Procedures Wound #1 Pre-procedure diagnosis of Wound #1 is a Pressure Ulcer located on the Right Calcaneus . There was a Excisional Skin/Subcutaneous Tissue Debridement with a total area of 0.52 sq cm performed by Maxwell Caul, MD. With the following instrument(s): Curette to remove Viable and Non-Viable tissue/material. Material removed includes Subcutaneous Tissue and Slough and. Monica specimens were taken. A time out was conducted at 14:04, prior to the start of the procedure. A Minimum amount of bleeding was controlled with Pressure. The procedure was tolerated well with a pain level of 0 throughout and a pain level of 0 following the procedure. Post Debridement Measurements: 0.6cm length x 1.1cm width x 0.6cm depth; 0.311cm^3 volume. Post debridement Stage noted as Category/Stage IV. Character of Wound/Ulcer Post Debridement is stable. Post procedure Diagnosis Wound #1: Same as Pre-Procedure KHADY, VANDENBERG ([id number]) [id number] Page 6 of 7 Plan Follow-up Appointments: Return Appointment in 1 week. Bathing/ Shower/ Hygiene: Wash wounds with antibacterial soap and water. Doylene Canning dial Anesthetic (Use 'Patient Medications' Section for  Anesthetic Order Entry): Lidocaine applied to wound bed WOUND #1: - Calcaneus Wound Laterality: Right Cleanser: Byram Ancillary Kit - 15 Day Supply 1 x Per Day/30 Days Discharge Instructions: Use supplies as instructed; Kit contains: (15) Saline Bullets; (15) 3x3 Gauze; 15 pr Gloves Cleanser: Soap and Water 1 x Per Day/30 Days Discharge Instructions: Gently cleanse wound with antibacterial soap, rinse and pat dry prior to dressing wounds Topical: Santyl Collagenase Ointment, 30 (gm), tube 1 x Per Day/30 Days Discharge Instructions: apply nickel thick to wound bed only Prim Dressing: Gauze (Generic) 1 x Per Day/30 Days ary Discharge Instructions: moistened with saline Secondary Dressing: (BORDER) Zetuvit Plus SILICONE BORDER Dressing 4x4 (in/in) (Generic) 1 x Per Day/30 Days Discharge Instructions: Please do not put silicone bordered dressings under wraps. Use non-bordered dressing only. 1. We continue with the Santyl with covering most moist gauze. I do not believe she requires the Dakin's 2. Debridement as noted surface cleans up quite nicely. 3. Seems to be progressively improving Monica evidence of concurrent infection Electronic Signature(s) Signed: 08/01/2022 4:43:46 PM By: Baltazar Najjar MD Entered By: Baltazar Najjar on 08/01/2022 14:13:33 -------------------------------------------------------------------------------- SuperBill Details Patient Name: Date of Service: [name], Monica RMA J. 08/01/2022 Medical Record Number: [id number] Patient Account Number: [id number] Date of Birth/Sex: Treating RN: 02-Jun-1933 (89 y.o. [name] Primary Care Provider: Bethann Punches Other Clinician: Referring Provider: Treating Provider/Extender: RO BSO N, MICHA EL [name], [name] in Treatment: 10 Diagnosis Coding ICD-10 Codes Code Description L89.610 Pressure ulcer of right heel, unstageable M05.80 Other rheumatoid arthritis with rheumatoid factor of unspecified site Facility  Procedures : CPT4 Code: [id number] Description: 11042 - DEB SUBQ TISSUE 20 SQ  CM/< ICD-10 Diagnosis Description L89.610 Pressure ulcer of right heel, unstageable Modifier: Quantity: 1 Physician Procedures : CPT4 Code Description Modifier [id number] 11042 - WC PHYS SUBQ TISS 20 SQ CM SOPHONIE, [name] ([id number]) [id number] ICD-10 Diagnosis Description L89.610 Pressure ulcer of right heel, unstageable Quantity: 1 f Page 7 of 7 Electronic Signature(s) Signed: 08/01/2022 4:43:46 PM By: Baltazar Najjar MD Entered By: Baltazar Najjar on 08/01/2022 14:13:04

## 2022-08-03 NOTE — Progress Notes (Signed)
[name], [name] ([id number]) [id number] Page 1 of 9 Visit Report for 08/01/2022 Arrival Information Details Patient Name: Date of Service: [facility], [name] 08/01/2022 1:15 PM Medical Record Number: [id number] Patient Account Number: [id number] Date of Birth/Sex: Treating RN: September 11, 1933 (89 y.o. [name] Primary Care Brettney Ficken: Bethann Punches Other Clinician: Referring Perina Salvaggio: Treating Nader Boys/Extender: RO BSO Dorris Carnes, MICHA EL Betsy Pries, Mark Weeks in Treatment: 10 Visit Information History Since Last Visit Added or deleted any medications: No Patient Arrived: Ambulatory Any new allergies or adverse reactions: No Arrival Time: 13:39 Has Dressing in Place as Prescribed: Yes Accompanied By: self Pain Present Now: No Transfer Assistance: None Patient Identification Verified: Yes Secondary Verification Process Completed: Yes Patient Requires Transmission-Based Precautions: No Patient Has Alerts: Yes Patient Alerts: NOT Diabetic PAD Bilateral ABI L1.08TBI .70 05/25/22 ABI R .97 TBI .70 05/25/22 Electronic Signature(s) Signed: 08/03/2022 4:43:06 PM By: Midge Aver MSN RN CNS WTA Entered By: Midge Aver on 08/01/2022 13:39:38 -------------------------------------------------------------------------------- Clinic Level of Care Assessment Details Patient Name: Date of Service: [facility], [name] 08/01/2022 1:15 PM Medical Record Number: [id number] Patient Account Number: [id number] Date of Birth/Sex: Treating RN: Sep 29, 1933 (89 y.o. [name] Primary Care Bruce Mayers: Bethann Punches Other Clinician: Referring Kambrea Carrasco: Treating Mikenna Bunkley/Extender: RO BSO Dorris Carnes, MICHA EL Mila Homer in Treatment: 10 Clinic Level of Care Assessment Items TOOL 1 Quantity Score []  - 0 Use when EandM and Procedure is performed on INITIAL visit ASSESSMENTS - Nursing Assessment / Reassessment []  - 0 General Physical Exam (combine w/ comprehensive assessment (listed just below)  when performed on new pt. evals) []  - 0 Comprehensive Assessment (HX, ROS, Risk Assessments, Wounds Hx, etc.) ASSESSMENTS - Wound and Skin Assessment / Reassessment [name], [name] ([id number]) [id number] Page 2 of 9 []  - 0 Dermatologic / Skin Assessment (not related to wound area) ASSESSMENTS - Ostomy and/or Continence Assessment and Care []  - 0 Incontinence Assessment and Management []  - 0 Ostomy Care Assessment and Management (repouching, etc.) PROCESS - Coordination of Care []  - 0 Simple Patient / Family Education for ongoing care []  - 0 Complex (extensive) Patient / Family Education for ongoing care []  - 0 Staff obtains Chiropractor, Records, T Results / Process Orders est []  - 0 Staff telephones HHA, Nursing Homes / Clarify orders / etc []  - 0 Routine Transfer to another Facility (non-emergent condition) []  - 0 Routine Hospital Admission (non-emergent condition) []  - 0 New Admissions / Manufacturing engineer / Ordering NPWT Apligraf, etc. , []  - 0 Emergency Hospital Admission (emergent condition) PROCESS - Special Needs []  - 0 Pediatric / Minor Patient Management []  - 0 Isolation Patient Management []  - 0 Hearing / Language / Visual special needs []  - 0 Assessment of Community assistance (transportation, D/C planning, etc.) []  - 0 Additional assistance / Altered mentation []  - 0 Support Surface(s) Assessment (bed, cushion, seat, etc.) INTERVENTIONS - Miscellaneous []  - 0 External ear exam []  - 0 Patient Transfer (multiple staff / Nurse, adult / Similar devices) []  - 0 Simple Staple / Suture removal (25 or less) []  - 0 Complex Staple / Suture removal (26 or more) []  - 0 Hypo/Hyperglycemic Management (do not check if billed separately) []  - 0 Ankle / Brachial Index (ABI) - do not check if billed separately Has the patient been seen at the hospital within the last three years: Yes Total Score: 0 Level Of Care: ____ Electronic  Signature(s) Signed: 08/03/2022 4:43:06 PM By: Midge Aver MSN RN CNS WTA  Entered By: Midge Aver on 08/01/2022 14:08:29 -------------------------------------------------------------------------------- Encounter Discharge Information Details Patient Name: Date of Service: [facility], [name] 08/01/2022 1:15 PM Medical Record Number: [id number] Patient Account Number: [id number] Date of Birth/Sex: Treating RN: Apr 08, 1933 (89 y.o. [name] Primary Care Halona Amstutz: Bethann Punches Other Clinician: Referring Mycheal Veldhuizen: Treating Jahara Dail/Extender: Chauncey Mann, MICHA EL Mila Homer in Treatment: [address], [place] J ([id number]) [id number] Page 3 of 9 Encounter Discharge Information Items Post Procedure Vitals Discharge Condition: Stable Temperature (F): 97.5 Ambulatory Status: Ambulatory Pulse (bpm): 98 Discharge Destination: Home Respiratory Rate (breaths/min): 18 Transportation: Private Auto Blood Pressure (mmHg): 116/68 Accompanied By: sister Schedule Follow-up Appointment: Yes Clinical Summary of Care: Electronic Signature(s) Signed: 08/03/2022 4:43:06 PM By: Midge Aver MSN RN CNS WTA Entered By: Midge Aver on 08/01/2022 14:19:56 -------------------------------------------------------------------------------- Lower Extremity Assessment Details Patient Name: Date of Service: [name], [name] 08/01/2022 1:15 PM Medical Record Number: [id number] Patient Account Number: [id number] Date of Birth/Sex: Treating RN: Feb 13, 1933 (89 y.o. [name] Primary Care Yannis Broce: Bethann Punches Other Clinician: Referring Jazlyn Tippens: Treating Stevon Gough/Extender: RO BSO N, MICHA EL Betsy Pries, Mark Weeks in Treatment: 10 Edema Assessment Assessed: [Left: No] [Right: No] [Left: Edema] [Right: :] Calf Left: Right: Point of Measurement: 32 cm From Medial Instep 32 cm Ankle Left: Right: Point of Measurement: 10 cm From Medial Instep 21 cm Vascular  Assessment Pulses: Dorsalis Pedis Palpable: [Right:Yes] Electronic Signature(s) Signed: 08/03/2022 4:43:06 PM By: Midge Aver MSN RN CNS WTA Entered By: Midge Aver on 08/01/2022 13:52:43 [name], [name] ([id number]) [id number] Page 4 of 9 -------------------------------------------------------------------------------- Multi Wound Chart Details Patient Name: Date of Service: [name], [name] 08/01/2022 1:15 PM Medical Record Number: [id number] Patient Account Number: [id number] Date of Birth/Sex: Treating RN: 03-25-1933 (89 y.o. [name] Primary Care Lorice Lafave: Bethann Punches Other Clinician: Referring Leyani Gargus: Treating Derryl Uher/Extender: RO BSO N, MICHA EL Betsy Pries, Mark Weeks in Treatment: 10 Vital Signs Height(in): 62 Pulse(bpm): 98 Weight(lbs): 118 Blood Pressure(mmHg): 116/68 Body Mass Index(BMI): 21.6 Temperature(F): 97.5 Respiratory Rate(breaths/min): 18 [1:Photos:] [N/A:N/A] Right Calcaneus N/A N/A Wound Location: Pressure Injury N/A N/A Wounding Event: Pressure Ulcer N/A N/A Primary Etiology: Hypertension, Rheumatoid Arthritis N/A N/A Comorbid History: 03/25/2022 N/A N/A Date Acquired: 10 N/A N/A Weeks of Treatment: Open N/A N/A Wound Status: No N/A N/A Wound Recurrence: 0.6x1.1x0.6 N/A N/A Measurements L x W x D (cm) 0.518 N/A N/A A (cm) : rea 0.311 N/A N/A Volume (cm) : 74.10% N/A N/A % Reduction in A rea: -55.50% N/A N/A % Reduction in Volume: Category/Stage IV N/A N/A Classification: Medium N/A N/A Exudate A mount: Serosanguineous N/A N/A Exudate Type: red, brown N/A N/A Exudate Color: None Present (0%) N/A N/A Granulation A mount: Large (67-100%) N/A N/A Necrotic A mount: Fat Layer (Subcutaneous Tissue): Yes N/A N/A Exposed Structures: Fascia: No Tendon: No Muscle: No Joint: No Bone: No None N/A N/A Epithelialization: Debridement - Excisional N/A N/A Debridement: Pre-procedure Verification/Time Out  14:04 N/A N/A Taken: Subcutaneous, Slough N/A N/A Tissue Debrided: Skin/Subcutaneous Tissue N/A N/A Level: 0.52 N/A N/A Debridement A (sq cm): rea Curette N/A N/A Instrument: Minimum N/A N/A Bleeding: Pressure N/A N/A Hemostasis A chieved: 0 N/A N/A Procedural Pain: 0 N/A N/A Post Procedural Pain: Procedure was tolerated well N/A N/A Debridement Treatment Response: 0.6x1.1x0.6 N/A N/A Post Debridement Measurements L x W x D (cm) 0.311 N/A N/A Post Debridement Volume: (cm) Category/Stage IV N/A N/A Post Debridement Stage: Debridement N/A N/A Procedures Performed: Treatment Notes  Wound #1 (Calcaneus) Wound Laterality: Right Cleanser Byram Ancillary Kit - 15 Day Supply Discharge Instruction: Use supplies as instructed; Kit contains: (15) Saline Bullets; (15) 3x3 Gauze; 15 pr Gloves [name], [name] ([id number]) [id number] Page 5 of 9 Soap and Water Discharge Instruction: Gently cleanse wound with antibacterial soap, rinse and pat dry prior to dressing wounds Peri-Wound Care Topical Santyl Collagenase Ointment, 30 (gm), tube Discharge Instruction: apply nickel thick to wound bed only Primary Dressing Gauze Discharge Instruction: moistened with saline Secondary Dressing (BORDER) Zetuvit Plus SILICONE BORDER Dressing 4x4 (in/in) Discharge Instruction: Please do not put silicone bordered dressings under wraps. Use non-bordered dressing only. Secured With Compression Wrap Compression Stockings Facilities manager) Signed: 08/03/2022 4:43:06 PM By: Midge Aver MSN RN CNS WTA Entered By: Midge Aver on 08/01/2022 14:18:34 -------------------------------------------------------------------------------- Multi-Disciplinary Care Plan Details Patient Name: Date of Service: [name], [name] 08/01/2022 1:15 PM Medical Record Number: [id number] Patient Account Number: [id number] Date of Birth/Sex: Treating RN: [birth year] (89 y.o. [name] Primary Care Staysha Truby: Bethann Punches Other Clinician: Referring Nyair Depaulo: Treating Caoimhe Damron/Extender: RO BSO N, MICHA EL Betsy Pries, Mark Weeks in Treatment: 10 Active Inactive Necrotic Tissue Nursing Diagnoses: Impaired tissue integrity related to necrotic/devitalized tissue Knowledge deficit related to management of necrotic/devitalized tissue Goals: Necrotic/devitalized tissue will be minimized in the wound bed Date Initiated: 05/22/2022 Date Inactivated: 06/27/2022 Target Resolution Date: 06/22/2022 Goal Status: Met Patient/caregiver will verbalize understanding of reason and process for debridement of necrotic tissue Date Initiated: 05/22/2022 Target Resolution Date: 08/24/2022 Goal Status: Active Interventions: Assess patient pain level pre-, during and post procedure and prior to discharge Provide education on necrotic tissue and debridement process Treatment Activities: Apply topical anesthetic as ordered : 05/22/2022 [name], [name] ([id number]) [id number] Page 6 of 9 Enzymatic debridement : 05/22/2022 Notes: Wound/Skin Impairment Nursing Diagnoses: Impaired tissue integrity Knowledge deficit related to ulceration/compromised skin integrity Goals: Patient/caregiver will verbalize understanding of skin care regimen Date Initiated: 05/22/2022 Date Inactivated: 06/27/2022 Target Resolution Date: 06/22/2022 Goal Status: Met Ulcer/skin breakdown will have a volume reduction of 30% by week 4 Date Initiated: 05/22/2022 Date Inactivated: 06/27/2022 Target Resolution Date: 06/22/2022 Goal Status: Met Ulcer/skin breakdown will have a volume reduction of 50% by week 8 Date Initiated: 05/22/2022 Date Inactivated: 07/18/2022 Target Resolution Date: 07/22/2022 Goal Status: Met Ulcer/skin breakdown will have a volume reduction of 80% by week 12 Date Initiated: 05/22/2022 Target Resolution Date: 08/22/2022 Goal Status: Active Ulcer/skin breakdown will heal  within 14 weeks Date Initiated: 05/22/2022 Target Resolution Date: 09/05/2022 Goal Status: Active Interventions: Assess patient/caregiver ability to obtain necessary supplies Assess patient/caregiver ability to perform ulcer/skin care regimen upon admission and as needed Assess ulceration(s) every visit Provide education on ulcer and skin care Treatment Activities: Referred to DME Morrison Mcbryar for dressing supplies : 05/22/2022 Skin care regimen initiated : 05/22/2022 Topical wound management initiated : 05/22/2022 Notes: Electronic Signature(s) Signed: 08/03/2022 4:43:06 PM By: Midge Aver MSN RN CNS WTA Entered By: Midge Aver on 08/01/2022 14:19:14 -------------------------------------------------------------------------------- Pain Assessment Details Patient Name: Date of Service: [name], [name] 08/01/2022 1:15 PM Medical Record Number: [id number] Patient Account Number: [id number] Date of Birth/Sex: Treating RN: 09-17-1933 (89 y.o. [name] Primary Care Ebbie Sorenson: Bethann Punches Other Clinician: Referring Jaquelyne Firkus: Treating Demarrion Meiklejohn/Extender: RO BSO N, MICHA EL Betsy Pries, Mark Weeks in Treatment: 10 Active Problems Location of Pain Severity and Description of Pain Patient Has Paino Yes Site Locations Rate the pain. [name], [name] ([id number]) [id number] Page 7 of 9 Rate  the pain. Current Pain Level: 1 Pain Management and Medication Current Pain Management: Electronic Signature(s) Signed: 08/03/2022 4:43:06 PM By: Midge Aver MSN RN CNS WTA Entered By: Midge Aver on 08/01/2022 13:42:17 -------------------------------------------------------------------------------- Patient/Caregiver Education Details Patient Name: Date of Service: [name], [name] 7/23/2024andnbsp1:15 PM Medical Record Number: [id number] Patient Account Number: [id number] Date of Birth/Gender: Treating RN: Jun 26, 1933 (89 y.o. [name] Primary Care Physician: Bethann Punches  Other Clinician: Referring Physician: Treating Physician/Extender: RO BSO Dorris Carnes, MICHA EL Mila Homer in Treatment: 10 Education Assessment Education Provided To: Patient Education Topics Provided Wound Debridement: Handouts: Wound Debridement Methods: Explain/Verbal Responses: State content correctly Wound/Skin Impairment: Handouts: Caring for Your Ulcer Methods: Explain/Verbal Responses: State content correctly Electronic Signature(s) Signed: 08/03/2022 4:43:06 PM By: Midge Aver MSN RN CNS WTA Entered By: Midge Aver on 08/01/2022 14:19:32 [name] ([id number]) [id number] Page 8 of 9 -------------------------------------------------------------------------------- Wound Assessment Details Patient Name: Date of Service: [name], [name] 08/01/2022 1:15 PM Medical Record Number: [id number] Patient Account Number: [id number] Date of Birth/Sex: Treating RN: 1933/12/17 (89 y.o. [name] Primary Care Lona Six: Bethann Punches Other Clinician: Referring Sylvestre Rathgeber: Treating Deane Wattenbarger/Extender: RO BSO N, MICHA EL Betsy Pries, Mark Weeks in Treatment: 10 Wound Status Wound Number: 1 Primary Etiology: Pressure Ulcer Wound Location: Right Calcaneus Wound Status: Open Wounding Event: Pressure Injury Comorbid History: Hypertension, Rheumatoid Arthritis Date Acquired: 03/25/2022 Weeks Of Treatment: 10 Clustered Wound: No Photos Wound Measurements Length: (cm) 0.6 Width: (cm) 1.1 Depth: (cm) 0.6 Area: (cm) 0.518 Volume: (cm) 0.311 % Reduction in Area: 74.1% % Reduction in Volume: -55.5% Epithelialization: None Wound Description Classification: Category/Stage IV Exudate Amount: Medium Exudate Type: Serosanguineous Exudate Color: red, brown Foul Odor After Cleansing: No Slough/Fibrino Yes Wound Bed Granulation Amount: None Present (0%) Exposed Structure Necrotic Amount: Large (67-100%) Fascia Exposed: No Necrotic Quality: Adherent  Slough Fat Layer (Subcutaneous Tissue) Exposed: Yes Tendon Exposed: No Muscle Exposed: No Joint Exposed: No Bone Exposed: No Treatment Notes Wound #1 (Calcaneus) Wound Laterality: Right Cleanser Byram Ancillary Kit - 15 Day Supply Discharge Instruction: Use supplies as instructed; Kit contains: (15) Saline Bullets; (15) 3x3 Gauze; 15 pr Gloves Soap and [address] ZHARA, GIESKE ([id number]) [id number] Page 9 of 9 Discharge Instruction: Gently cleanse wound with antibacterial soap, rinse and pat dry prior to dressing wounds Peri-Wound Care Topical Santyl Collagenase Ointment, 30 (gm), tube Discharge Instruction: apply nickel thick to wound bed only Primary Dressing Gauze Discharge Instruction: moistened with saline Secondary Dressing (BORDER) Zetuvit Plus SILICONE BORDER Dressing 4x4 (in/in) Discharge Instruction: Please do not put silicone bordered dressings under wraps. Use non-bordered dressing only. Secured With Compression Wrap Compression Stockings Facilities manager) Signed: 08/03/2022 4:43:06 PM By: Midge Aver MSN RN CNS WTA Entered By: Midge Aver on 08/01/2022 13:52:21 -------------------------------------------------------------------------------- Vitals Details Patient Name: Date of Service: [name], [name] 08/01/2022 1:15 PM Medical Record Number: [id number] Patient Account Number: [id number] Date of Birth/Sex: Treating RN: 03-Feb-1933 (89 y.o. [name] Primary Care Onesti Bonfiglio: Bethann Punches Other Clinician: Referring Dawaun Brancato: Treating Brenson Hartman/Extender: RO BSO N, MICHA EL Betsy Pries, Mark Weeks in Treatment: 10 Vital Signs Time Taken: 13:41 Temperature (F): 97.5 Height (in): 62 Pulse (bpm): 98 Weight (lbs): 118 Respiratory Rate (breaths/min): 18 Body Mass Index (BMI): 21.6 Blood Pressure (mmHg): 116/68 Reference Range: 80 - 120 mg / dl Electronic Signature(s) Signed: 08/03/2022 4:43:06 PM By: Midge Aver MSN RN CNS  WTA Entered By: Midge Aver on 08/01/2022 13:41:44

## 2022-08-08 ENCOUNTER — Encounter: Payer: Medicare Other | Admitting: Physician Assistant

## 2022-08-08 DIAGNOSIS — L89610 Pressure ulcer of right heel, unstageable: Secondary | ICD-10-CM | POA: Diagnosis not present

## 2022-08-08 NOTE — Progress Notes (Signed)
[name], [name] ([id number]) [id number] Page 1 of 9 Visit Report for 08/08/2022 Arrival Information Details Patient Name: Date of Service: [facility], Monica RMA J. 08/08/2022 1:15 PM Medical Record Number: [id number] Patient Account Number: [id number] Date of Birth/Sex: Treating RN: [birth year] (89 y.o. [name] Primary Care Justus Droke: Bethann Punches Other Clinician: Referring Montoya Brandel: Treating Saoirse Legere/Extender: [name] in Treatment: 11 Visit Information History Since Last Visit Added or deleted any medications: Monica Patient Arrived: Ambulatory Any new allergies or adverse reactions: Monica Arrival Time: 13:27 Has Dressing in Place as Prescribed: Yes Accompanied By: sister Pain Present Now: Monica Transfer Assistance: None Patient Identification Verified: Yes Secondary Verification Process Completed: Yes Patient Requires Transmission-Based Precautions: Monica Patient Has Alerts: Yes Patient Alerts: NOT Diabetic PAD Bilateral ABI L1.08TBI .70 05/25/22 ABI R .97 TBI .70 05/25/22 Electronic Signature(s) Signed: 08/08/2022 4:41:19 PM By: Midge Aver MSN RN CNS WTA Entered By: Midge Aver on 08/08/2022 13:27:45 -------------------------------------------------------------------------------- Clinic Level of Care Assessment Details Patient Name: Date of Service: [name], Monica RMA J. 08/08/2022 1:15 PM Medical Record Number: [id number] Patient Account Number: [id number] Date of Birth/Sex: Treating RN: 07/13/1933 (89 y.o. [name] Primary Care Falicity Sheets: Bethann Punches Other Clinician: Referring Alonia Dibuono: Treating Brownie Gockel/Extender: [name] in Treatment: 11 Clinic Level of Care Assessment Items TOOL 1 Quantity Score []  - 0 Use when EandM and Procedure is performed on INITIAL visit ASSESSMENTS - Nursing Assessment / Reassessment []  - 0 General Physical Exam (combine w/ comprehensive assessment (listed just below) when performed on  new pt. evals) []  - 0 Comprehensive Assessment (HX, ROS, Risk Assessments, Wounds Hx, etc.) ASSESSMENTS - Wound and Skin Assessment / Reassessment SEJLA, FUST ([id number]) [id number] Page 2 of 9 []  - 0 Dermatologic / Skin Assessment (not related to wound area) ASSESSMENTS - Ostomy and/or Continence Assessment and Care []  - 0 Incontinence Assessment and Management []  - 0 Ostomy Care Assessment and Management (repouching, etc.) PROCESS - Coordination of Care []  - 0 Simple Patient / Family Education for ongoing care []  - 0 Complex (extensive) Patient / Family Education for ongoing care []  - 0 Staff obtains Chiropractor, Records, T Results / Process Orders est []  - 0 Staff telephones HHA, Nursing Homes / Clarify orders / etc []  - 0 Routine Transfer to another Facility (non-emergent condition) []  - 0 Routine Hospital Admission (non-emergent condition) []  - 0 New Admissions / Manufacturing engineer / Ordering NPWT Apligraf, etc. , []  - 0 Emergency Hospital Admission (emergent condition) PROCESS - Special Needs []  - 0 Pediatric / Minor Patient Management []  - 0 Isolation Patient Management []  - 0 Hearing / Language / Visual special needs []  - 0 Assessment of Community assistance (transportation, D/C planning, etc.) []  - 0 Additional assistance / Altered mentation []  - 0 Support Surface(s) Assessment (bed, cushion, seat, etc.) INTERVENTIONS - Miscellaneous []  - 0 External ear exam []  - 0 Patient Transfer (multiple staff / Nurse, adult / Similar devices) []  - 0 Simple Staple / Suture removal (25 or less) []  - 0 Complex Staple / Suture removal (26 or more) []  - 0 Hypo/Hyperglycemic Management (do not check if billed separately) []  - 0 Ankle / Brachial Index (ABI) - do not check if billed separately Has the patient been seen at the hospital within the last three years: Yes Total Score: 0 Level Of Care: ____ Electronic Signature(s) Signed:  08/08/2022 4:41:19 PM By: Midge Aver MSN RN CNS WTA Entered By: Midge Aver on 08/08/2022 14:18:49 --------------------------------------------------------------------------------  Encounter Discharge Information Details Patient Name: Date of Service: [name], Monica RMA J. 08/08/2022 1:15 PM Medical Record Number: [id number] Patient Account Number: [id number] Date of Birth/Sex: Treating RN: 07-15-1933 (89 y.o. [name] Primary Care Jaida Basurto: Bethann Punches Other Clinician: Referring Kaleena Corrow: Treating Cartel Mauss/Extender: [name] in Treatment: [address] [place] J ([id number]) [id number] Page 3 of 9 Encounter Discharge Information Items Post Procedure Vitals Discharge Condition: Stable Temperature (F): 97.7 Ambulatory Status: Ambulatory Pulse (bpm): 90 Discharge Destination: Home Respiratory Rate (breaths/min): 18 Transportation: Private Auto Blood Pressure (mmHg): 121/69 Accompanied By: sister Schedule Follow-up Appointment: Yes Clinical Summary of Care: Electronic Signature(s) Signed: 08/08/2022 4:41:19 PM By: Midge Aver MSN RN CNS WTA Entered By: Midge Aver on 08/08/2022 14:21:13 -------------------------------------------------------------------------------- Lower Extremity Assessment Details Patient Name: Date of Service: [name], Monica RMA J. 08/08/2022 1:15 PM Medical Record Number: [id number] Patient Account Number: [id number] Date of Birth/Sex: Treating RN: 08-13-1933 (89 y.o. [name] Primary Care Rilee Knoll: Bethann Punches Other Clinician: Referring Lum Stillinger: Treating Chenae Brager/Extender: [name] in Treatment: 11 Edema Assessment Assessed: [name]: Yes] Franne Forts: Monica] [Left: Edema] [Right: :] Calf Left: Right: Point of Measurement: 32 cm From Medial Instep 32 cm Ankle Left: Right: Point of Measurement: 10 cm From Medial Instep 21 cm Vascular Assessment Pulses: Dorsalis Pedis Palpable:  [Right:Yes] Extremity colors, hair growth, and conditions: Hair Growth on Extremity: [Right:Monica] Temperature of Extremity: [Right:Warm] Capillary Refill: [Right:< 3 seconds] Dependent Rubor: [Right:Monica] Blanched when Elevated: [Right:Monica Monica] Toe Nail Assessment Left: Right: Thick: Monica Discolored: Monica Deformed: Monica Improper Length and Hygiene: Monica Electronic Signature(s) Signed: 08/08/2022 4:41:19 PM By: Midge Aver MSN RN CNS WTA Entered By: Midge Aver on 08/08/2022 13:39:42 [name], [name] ([id number]) [id number] Page 4 of 9 -------------------------------------------------------------------------------- Multi Wound Chart Details Patient Name: Date of Service: [name], Monica RMA J. 08/08/2022 1:15 PM Medical Record Number: [id number] Patient Account Number: [id number] Date of Birth/Sex: Treating RN: Dec 08, 1933 (89 y.o. [name] Primary Care Shawndra Clute: Bethann Punches Other Clinician: Referring Travaris Kosh: Treating Aubrea Meixner/Extender: [name] in Treatment: 11 Vital Signs Height(in): 62 Pulse(bpm): 90 Weight(lbs): 118 Blood Pressure(mmHg): 121/69 Body Mass Index(BMI): 21.6 Temperature(F): 97.7 Respiratory Rate(breaths/min): 18 [1:Photos:] [N/A:N/A] Right Calcaneus N/A N/A Wound Location: Pressure Injury N/A N/A Wounding Event: Pressure Ulcer N/A N/A Primary Etiology: Hypertension, Rheumatoid Arthritis N/A N/A Comorbid History: 03/25/2022 N/A N/A Date Acquired: 11 N/A N/A Weeks of Treatment: Open N/A N/A Wound Status: Monica N/A N/A Wound Recurrence: 0.6x0.8x0.7 N/A N/A Measurements L x W x D (cm) 0.377 N/A N/A A (cm) : rea 0.264 N/A N/A Volume (cm) : 81.20% N/A N/A % Reduction in A rea: -32.00% N/A N/A % Reduction in Volume: Category/Stage IV N/A N/A Classification: Medium N/A N/A Exudate A mount: Serosanguineous N/A N/A Exudate Type: red, brown N/A N/A Exudate Color: None Present (0%) N/A N/A Granulation A  mount: Large (67-100%) N/A N/A Necrotic A mount: Fat Layer (Subcutaneous Tissue): Yes N/A N/A Exposed Structures: Fascia: Monica Tendon: Monica Muscle: Monica Joint: Monica Bone: Monica None N/A N/A Epithelialization: Treatment Notes Electronic Signature(s) Signed: 08/08/2022 4:41:19 PM By: Midge Aver MSN RN CNS WTA Entered By: Midge Aver on 08/08/2022 13:43:44 [name], [name] ([id number]) [id number] Page 5 of 9 -------------------------------------------------------------------------------- Multi-Disciplinary Care Plan Details Patient Name: Date of Service: [name], Monica RMA J. 08/08/2022 1:15 PM Medical Record Number: [id number] Patient Account Number: [id number] Date of Birth/Sex: Treating RN: 04-28-1933 (89 y.o. [name] Primary Care Carlissa Pesola: Bethann Punches Other Clinician:  Referring Takuma Cifelli: Treating Donnia Poplaski/Extender: [name] in Treatment: 11 Active Inactive Necrotic Tissue Nursing Diagnoses: Impaired tissue integrity related to necrotic/devitalized tissue Knowledge deficit related to management of necrotic/devitalized tissue Goals: Necrotic/devitalized tissue will be minimized in the wound bed Date Initiated: 05/22/2022 Date Inactivated: 06/27/2022 Target Resolution Date: 06/22/2022 Goal Status: Met Patient/caregiver will verbalize understanding of reason and process for debridement of necrotic tissue Date Initiated: 05/22/2022 Target Resolution Date: 08/24/2022 Goal Status: Active Interventions: Assess patient pain level pre-, during and post procedure and prior to discharge Provide education on necrotic tissue and debridement process Treatment Activities: Apply topical anesthetic as ordered : 05/22/2022 Enzymatic debridement : 05/22/2022 Notes: Wound/Skin Impairment Nursing Diagnoses: Impaired tissue integrity Knowledge deficit related to ulceration/compromised skin integrity Goals: Patient/caregiver will verbalize understanding  of skin care regimen Date Initiated: 05/22/2022 Date Inactivated: 06/27/2022 Target Resolution Date: 06/22/2022 Goal Status: Met Ulcer/skin breakdown will have a volume reduction of 30% by week 4 Date Initiated: 05/22/2022 Date Inactivated: 06/27/2022 Target Resolution Date: 06/22/2022 Goal Status: Met Ulcer/skin breakdown will have a volume reduction of 50% by week 8 Date Initiated: 05/22/2022 Date Inactivated: 07/18/2022 Target Resolution Date: 07/22/2022 Goal Status: Met Ulcer/skin breakdown will have a volume reduction of 80% by week 12 Date Initiated: 05/22/2022 Target Resolution Date: 08/22/2022 Goal Status: Active Ulcer/skin breakdown will heal within 14 weeks Date Initiated: 05/22/2022 Target Resolution Date: 09/05/2022 Goal Status: Active Interventions: Assess patient/caregiver ability to obtain necessary supplies Assess patient/caregiver ability to perform ulcer/skin care regimen upon admission and as needed Assess ulceration(s) every visit [name], [name] ([id number]) [id number] Page 6 of 9 Provide education on ulcer and skin care Treatment Activities: Referred to DME Delynn Pursley for dressing supplies : 05/22/2022 Skin care regimen initiated : 05/22/2022 Topical wound management initiated : 05/22/2022 Notes: Electronic Signature(s) Signed: 08/08/2022 4:41:19 PM By: Midge Aver MSN RN CNS WTA Entered By: Midge Aver on 08/08/2022 14:19:35 -------------------------------------------------------------------------------- Pain Assessment Details Patient Name: Date of Service: [name], Monica RMA J. 08/08/2022 1:15 PM Medical Record Number: [id number] Patient Account Number: [id number] Date of Birth/Sex: Treating RN: 01-Jan-1934 (89 y.o. [name] Primary Care Carron Mcmurry: Bethann Punches Other Clinician: Referring Ryelle Ruvalcaba: Treating Morad Tal/Extender: [name] in Treatment: 11 Active Problems Location of Pain Severity and Description of  Pain Patient Has Paino Monica Site Locations Pain Management and Medication Current Pain Management: Electronic Signature(s) Signed: 08/08/2022 4:41:19 PM By: Midge Aver MSN RN CNS WTA Entered By: Midge Aver on 08/08/2022 13:30:12 [name] ([id number]) [id number] Page 7 of 9 -------------------------------------------------------------------------------- Patient/Caregiver Education Details Patient Name: Date of Service: [name], Monica RMA J. 7/30/2024andnbsp1:15 PM Medical Record Number: [id number] Patient Account Number: [id number] Date of Birth/Gender: Treating RN: 09-08-1933 (89 y.o. [name] Primary Care Physician: Bethann Punches Other Clinician: Referring Physician: Treating Physician/Extender: [name] in Treatment: 11 Education Assessment Education Provided To: Patient Education Topics Provided Wound/Skin Impairment: Handouts: Caring for Your Ulcer Methods: Explain/Verbal Responses: State content correctly Electronic Signature(s) Signed: 08/08/2022 4:41:19 PM By: Midge Aver MSN RN CNS WTA Entered By: Midge Aver on 08/08/2022 14:19:55 -------------------------------------------------------------------------------- Wound Assessment Details Patient Name: Date of Service: [name], Monica RMA J. 08/08/2022 1:15 PM Medical Record Number: [id number] Patient Account Number: [id number] Date of Birth/Sex: Treating RN: November 01, 1933 (89 y.o. [name] Primary Care Kyrin Gratz: Bethann Punches Other Clinician: Referring Cesario Weidinger: Treating Lolamae Voisin/Extender: [name] in Treatment: 11 Wound Status Wound Number: 1 Primary Etiology: Pressure Ulcer Wound Location: Right Calcaneus Wound Status:  Open Wounding Event: Pressure Injury Comorbid History: Hypertension, Rheumatoid Arthritis Date Acquired: 03/25/2022 Weeks Of Treatment: 11 Clustered Wound: Monica Photos LIEL, LERER ([id number])  [id number] Page 8 of 9 Wound Measurements Length: (cm) 0.6 Width: (cm) 0.8 Depth: (cm) 0.7 Area: (cm) 0.377 Volume: (cm) 0.264 % Reduction in Area: 81.2% % Reduction in Volume: -32% Epithelialization: None Wound Description Classification: Category/Stage IV Exudate Amount: Medium Exudate Type: Serosanguineous Exudate Color: red, brown Foul Odor After Cleansing: Monica Slough/Fibrino Yes Wound Bed Granulation Amount: None Present (0%) Exposed Structure Necrotic Amount: Large (67-100%) Fascia Exposed: Monica Necrotic Quality: Adherent Slough Fat Layer (Subcutaneous Tissue) Exposed: Yes Tendon Exposed: Monica Muscle Exposed: Monica Joint Exposed: Monica Bone Exposed: Monica Treatment Notes Wound #1 (Calcaneus) Wound Laterality: Right Cleanser Byram Ancillary Kit - 15 Day Supply Discharge Instruction: Use supplies as instructed; Kit contains: (15) Saline Bullets; (15) 3x3 Gauze; 15 pr Gloves Soap and Water Discharge Instruction: Gently cleanse wound with antibacterial soap, rinse and pat dry prior to dressing wounds Peri-Wound Care Topical Primary Dressing Hydrofera Blue Ready Transfer Foam, 2.5x2.5 (in/in) Discharge Instruction: Apply Hydrofera Blue Ready to wound bed as directed T Adhesive [employer], 4x4 (in/in) elfa Secondary Dressing Secured With Compression Wrap Compression Stockings Facilities manager) Signed: 08/08/2022 4:41:19 PM By: Midge Aver MSN RN CNS WTA Entered By: Midge Aver on 08/08/2022 13:38:22 [name], [name] ([id number]) [id number] Page 9 of 9 -------------------------------------------------------------------------------- Vitals Details Patient Name: Date of Service: [name], Monica RMA J. 08/08/2022 1:15 PM Medical Record Number: [id number] Patient Account Number: [id number] Date of Birth/Sex: Treating RN: Oct 31, 1933 (89 y.o. [name] Primary Care Hava Massingale: Bethann Punches Other  Clinician: Referring Kaizen Ibsen: Treating Jerrelle Michelsen/Extender: [name] in Treatment: 11 Vital Signs Time Taken: 13:27 Temperature (F): 97.7 Height (in): 62 Pulse (bpm): 90 Weight (lbs): 118 Respiratory Rate (breaths/min): 18 Body Mass Index (BMI): 21.6 Blood Pressure (mmHg): 121/69 Reference Range: 80 - 120 mg / dl Electronic Signature(s) Signed: 08/08/2022 4:41:19 PM By: Midge Aver MSN RN CNS WTA Entered By: Midge Aver on 08/08/2022 13:29:53

## 2022-08-08 NOTE — Progress Notes (Addendum)
[place], TALK ([id number]) [id number] Page 1 of 7 Visit Report for 08/08/2022 Chief Complaint Document Details Patient Name: Date of Service: [name], [name] 08/08/2022 1:15 PM Medical Record Number: [id number] Patient Account Number: [id number] Date of Birth/Sex: Treating RN: 02/10/1933 (89 y.o. [name] Primary Care Provider: Bethann Punches Other Clinician: Referring Provider: Treating Provider/Extender: [name] in Treatment: 11 Information Obtained from: Patient Chief Complaint Right heel pressure ulcer Electronic Signature(s) Signed: 08/08/2022 1:53:07 PM By: Monica Derry PA-C Entered By: [name] on 08/08/2022 13:53:07 -------------------------------------------------------------------------------- Debridement Details Patient Name: Date of Service: [name], [name] 08/08/2022 1:15 PM Medical Record Number: [id number] Patient Account Number: [id number] Date of Birth/Sex: Treating RN: 05-May-1933 (89 y.o. [name] Primary Care Provider: Bethann Punches Other Clinician: Referring Provider: Treating Provider/Extender: [name] in Treatment: 11 Debridement Performed for Assessment: Wound #1 Right Calcaneus Performed By: Physician Monica Derry, PA-C Debridement Type: Debridement Level of Consciousness (Pre-procedure): Awake and Alert Pre-procedure Verification/Time Out Yes - 14:03 Taken: Start Time: 14:03 Pain Control: Lidocaine 4% T opical Solution Percent of Wound Bed Debrided: 100% T Area Debrided (cm): otal 0.38 Tissue and other material debrided: Viable, Non-Viable, Slough, Subcutaneous, Skin: Dermis , Skin: Epidermis, Slough Level: Skin/Subcutaneous Tissue Debridement Description: Excisional Instrument: Curette Bleeding: Moderate Hemostasis Achieved: Pressure Procedural Pain: 0 Post Procedural Pain: 0 Response to Treatment: Procedure was tolerated well [name] ([id number])  [id number] Page 2 of 7 Level of Consciousness (Post- Awake and Alert procedure): Post Debridement Measurements of Total Wound Length: (cm) 0.6 Stage: Category/Stage IV Width: (cm) 0.8 Depth: (cm) 0.7 Volume: (cm) 0.264 Character of Wound/Ulcer Post Debridement: Stable Post Procedure Diagnosis Same as Pre-procedure Electronic Signature(s) Signed: 08/08/2022 4:41:19 PM By: Midge Aver MSN RN CNS WTA Signed: 08/08/2022 4:58:57 PM By: Monica Derry PA-C Entered By: Midge Aver on 08/08/2022 14:04:42 -------------------------------------------------------------------------------- HPI Details Patient Name: Date of Service: [name], [name] 08/08/2022 1:15 PM Medical Record Number: [id number] Patient Account Number: [id number] Date of Birth/Sex: Treating RN: 1933-08-04 (89 y.o. [name] Primary Care Provider: Bethann Punches Other Clinician: Referring Provider: Treating Provider/Extender: [name] in Treatment: 11 History of Present Illness HPI Description: 05-22-2022 upon evaluation today patient appears to be having issues with the wound on her right heel. This actually occurred during a surgical intervention for her left knee she had a knee replacement. She went in with no issues and when she got home or even while she was staying overnight before being discharged home noted to have an issue with a discolored area on her right heel. She is unsure how this happened but knows it was not present prior to the surgery. She has been monitoring since it is now an eschar covered region that obviously is going to need to be removed in order to allow this to heal. With that being said her ABIs were not registering very well NuShield today it appears that she may have a issue with good arterial flow this could be somewhat accounting for what is going on with the heel. Nonetheless I do think she is probably going to be seen by a vein and vascular for  arterial studies. She had the surgery on Friday and came home on a Saturday. This was a very short stay. Patient does have a history of rheumatoid arthritis but really no other major medical problems. 05-29-2022 upon evaluation today patient appears to be doing well currently in regard to  her wound. In fact she is telling me that she is doing quite well at this point. Fortunately there does not appear to be any signs of active infection locally nor systemically at this time which is great news. No fevers, chills, nausea, vomiting, or diarrhea. The good news that she did get the Santyl although it took until Friday. 06-15-2022 upon evaluation today patient appears to be doing well currently in regard to her heel ulcer. We are slowly getting this to loosen up and clearway. Fortunately I do not see any signs of active infection at this time which is great news I think that we are definitely ready to clear away some of the necrotic debris today. 06-20-2022 upon evaluation today patient's wound is actually showing signs of good improvement with regard to the heel. She has been tolerating the dressing changes without complication and currently I am going to recommend that we go ahead and continue with the plan using the Santyl to help clean up the surface of this wound. Really she seems to be doing quite well with that. 06-27-2022 upon evaluation patient actually appears to be doing better she is slowly showing signs of improvement. With that being said the wound is a bit deeper but this is only because we are getting to the base of the wound as far as cleaning out the necrotic tissue is concerned. Fortunately I think the Santyl is loosening things up so we can actually do this. I think we are going to switch to using Dakin's moistened gauze over top of the Santyl as of today. 07-04-2022 upon evaluation today patient appears to be doing well currently in regard to her wound. She has been tolerating the dressing  changes without complication. Fortunately I do not see any signs of active infection at this time which is great news and in general I do believe that removing in the appropriate direction here. She is going to require some debridement today. 07-18-2022 upon evaluation today patient appears to be doing well currently in regard to her wound. In fact this is showing signs of improvement which is great news. Fortunately I do not see any evidence of active infection locally or systemically which is great news. No fevers, chills, nausea, vomiting, or diarrhea. 07-25-2022 upon evaluation today patient appears to be doing well currently in regard to her heel. She is slowly making good progress towards complete closure which is excellent news. I do not see any signs of active infection at this time which is great news as well. 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 We have been using Santyl Dakin's gauze Zetuvit and a border foam. ADILENNE, AHLES ([id number]) [id number] Page 3 of 7 She is making nice progress 08-08-2022 upon evaluation today patient appears to be doing well currently in regard to her wound. She has been tolerating the dressing changes without complication. With that being said I think it may be time for [place] to go and make a switch here as far as the wound is concerned to something different. She is in agreement with this plan I think Hydrofera Blue's can be an also option at this point. Electronic Signature(s) Signed: 08/08/2022 2:11:19 PM By: Monica Derry PA-C Entered By: [name] on 08/08/2022 14:11:19 -------------------------------------------------------------------------------- Physical Exam Details Patient Name: Date of Service: [name], [name] 08/08/2022 1:15 PM Medical Record Number: [id number] Patient Account Number: [id number] Date of Birth/Sex: Treating RN: 04/24/1933 (89 y.o. Karie Schwalbe,  Chip Boer Primary  Care Provider: Bethann Punches Other Clinician: Referring Provider: Treating Provider/Extender: [name] in Treatment: 11 Constitutional Well-nourished and well-hydrated in no acute distress. Respiratory normal breathing without difficulty. Psychiatric this patient is able to make decisions and demonstrates good insight into disease process. Alert and Oriented x 3. pleasant and cooperative. Notes Upon inspection patient's wound bed actually showed signs of good granulation epithelization at this point. Fortunately I do not see any signs of worsening overall and I do believe that we are making headway towards complete closure which is great news. No fevers, chills, nausea, vomiting, or diarrhea. I did perform debridement clearway some of the necrotic debris she tolerated that today without complication and postdebridement the wound bed is improved. Electronic Signature(s) Signed: 08/08/2022 2:11:41 PM By: Monica Derry PA-C Entered By: [name] on 08/08/2022 14:11:41 -------------------------------------------------------------------------------- Physician Orders Details Patient Name: Date of Service: [name], [name] 08/08/2022 1:15 PM Medical Record Number: [id number] Patient Account Number: [id number] Date of Birth/Sex: Treating RN: January 05, 1934 (89 y.o. [name] Primary Care Provider: Bethann Punches Other Clinician: Referring Provider: Treating Provider/Extender: [name] in Treatment: 11 Verbal / Phone Orders: No TYIANA, MACCHIO ([id number]) [id number] Page 4 of 7 Diagnosis Coding ICD-10 Coding Code Description L89.610 Pressure ulcer of right heel, unstageable M05.80 Other rheumatoid arthritis with rheumatoid factor of unspecified site Follow-up Appointments Return Appointment in 1 week. Bathing/ [employer] wounds with antibacterial soap and water. Doylene Canning dial Anesthetic (Use 'Patient  Medications' Section for Anesthetic Order Entry) Lidocaine applied to wound bed Wound Treatment Wound #1 - Calcaneus Wound Laterality: Right Cleanser: Byram Ancillary Kit - 15 Day Supply 1 x Per Day/30 Days Discharge Instructions: Use supplies as instructed; Kit contains: (15) Saline Bullets; (15) 3x3 Gauze; 15 pr Gloves Cleanser: Soap and Water 1 x Per Day/30 Days Discharge Instructions: Gently cleanse wound with antibacterial soap, rinse and pat dry prior to dressing wounds Prim Dressing: Hydrofera Blue Ready Transfer Foam, 2.5x2.5 (in/in) 1 x Per Day/30 Days ary Discharge Instructions: Apply Hydrofera Blue Ready to wound bed as directed Prim Dressing: T Adhesive [employer], 4x4 (in/in) ary elfa 1 x Per Day/30 Days Electronic Signature(s) Signed: 08/08/2022 4:41:19 PM By: Midge Aver MSN RN CNS WTA Signed: 08/08/2022 4:58:57 PM By: Monica Derry PA-C Entered By: Midge Aver on 08/08/2022 14:05:39 -------------------------------------------------------------------------------- Problem List Details Patient Name: Date of Service: [name], [name] 08/08/2022 1:15 PM Medical Record Number: [id number] Patient Account Number: [id number] Date of Birth/Sex: Treating RN: [birth year] (89 y.o. [name] Primary Care Provider: Bethann Punches Other Clinician: Referring Provider: Treating Provider/Extender: [name] in Treatment: 11 Active Problems ICD-10 Encounter Code Description Active Date MDM Diagnosis L89.610 Pressure ulcer of right heel, unstageable 05/22/2022 No Yes M05.80 Other rheumatoid arthritis with rheumatoid factor of unspecified site 05/22/2022 No Yes Inactive Problems KIEU, SANKEY ([id number]) [id number] Page 5 of 7 Resolved Problems Electronic Signature(s) Signed: 08/08/2022 1:52:58 PM By: Monica Derry PA-C Entered By: [name] on 08/08/2022  13:52:58 -------------------------------------------------------------------------------- Progress Note Details Patient Name: Date of Service: [name], [name] 08/08/2022 1:15 PM Medical Record Number: [id number] Patient Account Number: [id number] Date of Birth/Sex: Treating RN: [birth year] (89 y.o. [name] Primary Care Provider: Bethann Punches Other Clinician: Referring Provider: Treating Provider/Extender: [name] in Treatment: 11 Subjective Chief Complaint Information obtained from Patient Right heel pressure ulcer History of Present Illness (HPI) 05-22-2022 upon evaluation today  patient appears to be having issues with the wound on her right heel. This actually occurred during a surgical intervention for her left knee she had a knee replacement. She went in with no issues and when she got home or even while she was staying overnight before being discharged home noted to have an issue with a discolored area on her right heel. She is unsure how this happened but knows it was not present prior to the surgery. She has been monitoring since it is now an eschar covered region that obviously is going to need to be removed in order to allow this to heal. With that being said her ABIs were not registering very well NuShield today it appears that she may have a issue with good arterial flow this could be somewhat accounting for what is going on with the heel. Nonetheless I do think she is probably going to be seen by a vein and vascular for arterial studies. She had the surgery on Friday and came home on a Saturday. This was a very short stay. Patient does have a history of rheumatoid arthritis but really no other major medical problems. 05-29-2022 upon evaluation today patient appears to be doing well currently in regard to her wound. In fact she is telling me that she is doing quite well at this point. Fortunately there does not appear to be any signs of active infection  locally nor systemically at this time which is great news. No fevers, chills, nausea, vomiting, or diarrhea. The good news that she did get the Santyl although it took until Friday. 06-15-2022 upon evaluation today patient appears to be doing well currently in regard to her heel ulcer. We are slowly getting this to loosen up and clearway. Fortunately I do not see any signs of active infection at this time which is great news I think that we are definitely ready to clear away some of the necrotic debris today. 06-20-2022 upon evaluation today patient's wound is actually showing signs of good improvement with regard to the heel. She has been tolerating the dressing changes without complication and currently I am going to recommend that we go ahead and continue with the plan using the Santyl to help clean up the surface of this wound. Really she seems to be doing quite well with that. 06-27-2022 upon evaluation patient actually appears to be doing better she is slowly showing signs of improvement. With that being said the wound is a bit deeper but this is only because we are getting to the base of the wound as far as cleaning out the necrotic tissue is concerned. Fortunately I think the Santyl is loosening things up so we can actually do this. I think we are going to switch to using Dakin's moistened gauze over top of the Santyl as of today. 07-04-2022 upon evaluation today patient appears to be doing well currently in regard to her wound. She has been tolerating the dressing changes without complication. Fortunately I do not see any signs of active infection at this time which is great news and in general I do believe that removing in the appropriate direction here. She is going to require some debridement today. 07-18-2022 upon evaluation today patient appears to be doing well currently in regard to her wound. In fact this is showing signs of improvement which is great news. Fortunately I do not see any  evidence of active infection locally or systemically which is great news. No fevers, chills, nausea, vomiting, or diarrhea.  07-25-2022 upon evaluation today patient appears to be doing well currently in regard to her heel. She is slowly making good progress towards complete closure which is excellent news. I do not see any signs of active infection at this time which is great news as well. 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 We have been using Santyl Dakin's gauze Zetuvit and a border foam. She is making nice progress 08-08-2022 upon evaluation today patient appears to be doing well currently in regard to her wound. She has been tolerating the dressing changes without complication. With that being said I think it may be time for [place] to go and make a switch here as far as the wound is concerned to something different. She is in agreement with this plan I think Hydrofera Blue's can be an also option at this point. KETURAH, MORRIS ([id number]) [id number] Page 6 of 7 Objective Constitutional Well-nourished and well-hydrated in no acute distress. Vitals Time Taken: 1:27 PM, Height: 62 in, Weight: 118 lbs, BMI: 21.6, Temperature: 97.7 F, Pulse: 90 bpm, Respiratory Rate: 18 breaths/min, Blood Pressure: 121/69 mmHg. Respiratory normal breathing without difficulty. Psychiatric this patient is able to make decisions and demonstrates good insight into disease process. Alert and Oriented x 3. pleasant and cooperative. General Notes: Upon inspection patient's wound bed actually showed signs of good granulation epithelization at this point. Fortunately I do not see any signs of worsening overall and I do believe that we are making headway towards complete closure which is great news. No fevers, chills, nausea, vomiting, or diarrhea. I did perform debridement clearway some of the necrotic debris she tolerated that today without  complication and postdebridement the wound bed is improved. Integumentary (Hair, Skin) Wound #1 status is Open. Original cause of wound was Pressure Injury. The date acquired was: 03/25/2022. The wound has been in treatment 11 weeks. The wound is located on the Right Calcaneus. The wound measures 0.6cm length x 0.8cm width x 0.7cm depth; 0.377cm^2 area and 0.264cm^3 volume. There is Fat Layer (Subcutaneous Tissue) exposed. There is a medium amount of serosanguineous drainage noted. There is no granulation within the wound bed. There is a large (67-100%) amount of necrotic tissue within the wound bed including Adherent Slough. Assessment Active Problems ICD-10 Pressure ulcer of right heel, unstageable Other rheumatoid arthritis with rheumatoid factor of unspecified site Procedures Wound #1 Pre-procedure diagnosis of Wound #1 is a Pressure Ulcer located on the Right Calcaneus . There was a Excisional Skin/Subcutaneous Tissue Debridement with a total area of 0.38 sq cm performed by Monica Derry, PA-C. With the following instrument(s): Curette to remove Viable and Non-Viable tissue/material. Material removed includes Subcutaneous Tissue, Slough, Skin: Dermis, and Skin: Epidermis after achieving pain control using Lidocaine 4% T opical Solution. No specimens were taken. A time out was conducted at 14:03, prior to the start of the procedure. A Moderate amount of bleeding was controlled with Pressure. The procedure was tolerated well with a pain level of 0 throughout and a pain level of 0 following the procedure. Post Debridement Measurements: 0.6cm length x 0.8cm width x 0.7cm depth; 0.264cm^3 volume. Post debridement Stage noted as Category/Stage IV. Character of Wound/Ulcer Post Debridement is stable. Post procedure Diagnosis Wound #1: Same as Pre-Procedure Plan Follow-up Appointments: Return Appointment in 1 week. Bathing/ Shower/ Hygiene: Wash wounds with antibacterial soap and water. Doylene Canning  dial Anesthetic (Use 'Patient Medications' Section for Anesthetic Order Entry): Lidocaine applied to wound bed WOUND #  1: - Calcaneus Wound Laterality: Right Cleanser: Byram Ancillary Kit - 15 Day Supply 1 x Per Day/30 Days Discharge Instructions: Use supplies as instructed; Kit contains: (15) Saline Bullets; (15) 3x3 Gauze; 15 pr Gloves Cleanser: Soap and Water 1 x Per Day/30 Days Discharge Instructions: Gently cleanse wound with antibacterial soap, rinse and pat dry prior to dressing wounds Prim Dressing: Hydrofera Blue Ready Transfer Foam, 2.5x2.5 (in/in) 1 x Per Day/30 Days ary Discharge Instructions: Apply Hydrofera Blue Ready to wound bed as directed Prim Dressing: T Adhesive [employer], 4x4 (in/in) 1 x Per Day/30 Days ary elfa 1. I would recommend that we have the patient continue to monitor for any signs of infection or worsening. Based on what I am seeing I do believe that we are making excellent headway towards complete closure. 2. I am good recommend patient should continue to monitor for any signs of infection we will be switching to the [employer] Robertson which I think is actually a really good OPTION. JANELYN, FARRIOR ([id number]) [id number] Page 7 of 7 We will see patient back for reevaluation in 1 week here in the clinic. If anything worsens or changes patient will contact our office for additional recommendations. Electronic Signature(s) Signed: 08/08/2022 2:12:09 PM By: Monica Derry PA-C Entered By: [name] on 08/08/2022 14:12:09 -------------------------------------------------------------------------------- SuperBill Details Patient Name: Date of Service: [name], [name] 08/08/2022 Medical Record Number: [id number] Patient Account Number: [id number] Date of Birth/Sex: Treating RN: 12-Jan-1933 (89 y.o. [name] Primary Care Provider: Bethann Punches Other Clinician: Referring Provider: Treating Provider/Extender: [name] in Treatment: 11 Diagnosis Coding ICD-10 Codes Code Description L89.610 Pressure ulcer of right heel, unstageable M05.80 Other rheumatoid arthritis with rheumatoid factor of unspecified site Facility Procedures : CPT4 Code: [id number] Description: 11042 - DEB SUBQ TISSUE 20 SQ CM/< ICD-10 Diagnosis Description L89.610 Pressure ulcer of right heel, unstageable Modifier: Quantity: 1 Physician Procedures : CPT4 Code Description Modifier [id number] 11042 - WC PHYS SUBQ TISS 20 SQ CM ICD-10 Diagnosis Description L89.610 Pressure ulcer of right heel, unstageable Quantity: 1 Electronic Signature(s) Signed: 08/08/2022 4:41:19 PM By: Midge Aver MSN RN CNS WTA Signed: 08/08/2022 4:58:57 PM By: Monica Derry PA-C Previous Signature: 08/08/2022 2:12:26 PM Version By: Monica Derry PA-C Entered By: Midge Aver on 08/08/2022 14:19:10

## 2022-08-15 ENCOUNTER — Encounter: Payer: Medicare Other | Attending: Physician Assistant | Admitting: Physician Assistant

## 2022-08-15 DIAGNOSIS — L89514 Pressure ulcer of right ankle, stage 4: Secondary | ICD-10-CM | POA: Insufficient documentation

## 2022-08-15 DIAGNOSIS — I10 Essential (primary) hypertension: Secondary | ICD-10-CM | POA: Diagnosis not present

## 2022-08-15 DIAGNOSIS — Z96652 Presence of left artificial knee joint: Secondary | ICD-10-CM | POA: Diagnosis not present

## 2022-08-15 DIAGNOSIS — M0580 Other rheumatoid arthritis with rheumatoid factor of unspecified site: Secondary | ICD-10-CM | POA: Diagnosis not present

## 2022-08-15 NOTE — Progress Notes (Signed)
[name], [name] ([id number]) [id number] Page 1 of 8 Visit Report for 08/15/2022 Arrival Information Details Patient Name: Date of Service: [facility], [name]. 08/15/2022 3:30 PM Medical Record Number: [id number] Patient Account Number: [id number] Date of Birth/Sex: Treating RN: Nov 30, 1933 (89 y.o. [name] Primary Care : Bethann Punches Other Clinician: Referring : Treating /Extender: [name] in Treatment: 12 Visit Information History Since Last Visit Added or deleted any medications: Monica Patient Arrived: Ambulatory Any new allergies or adverse reactions: Monica Arrival Time: 15:26 Had Robertson fall or experienced change in Monica Accompanied By: family activities of daily living that may affect Transfer Assistance: None risk of falls: Patient Identification Verified: Yes Hospitalized since last visit: Monica Secondary Verification Process Completed: Yes Has Dressing in Place as Prescribed: Yes Patient Requires Transmission-Based Precautions: Monica Pain Present Now: Monica Patient Has Alerts: Yes Patient Alerts: NOT Diabetic PAD Bilateral ABI L1.08TBI .70 05/25/22 ABI R .97 TBI .70 05/25/22 Electronic Signature(s) Signed: 08/15/2022 3:50:51 PM By: Midge Aver MSN RN CNS WTA Entered By: Midge Aver on 08/15/2022 15:50:51 -------------------------------------------------------------------------------- Clinic Level of Care Assessment Details Patient Name: Date of Service: [name], [name]. 08/15/2022 3:30 PM Medical Record Number: [id number] Patient Account Number: [id number] Date of Birth/Sex: Treating RN: Jan 19, 1933 (89 y.o. [name] Primary Care : Bethann Punches Other Clinician: Referring : Treating /Extender: [name] in Treatment: 12 Clinic Level of Care Assessment Items TOOL 1 Quantity Score []  - 0 Use when EandM and Procedure is performed on INITIAL visit ASSESSMENTS -  Nursing Assessment / Reassessment []  - 0 General Physical Exam (combine w/ comprehensive assessment (listed just below) when performed on new pt. evals) []  - 0 Comprehensive Assessment (HX, ROS, Risk Assessments, Wounds Hx, etc.) ASSESSMENTS - Wound and Skin Assessment / Reassessment [name] ([id number]) [id number] Page 2 of 8 []  - 0 Dermatologic / Skin Assessment (not related to wound area) ASSESSMENTS - Ostomy and/or Continence Assessment and Care []  - 0 Incontinence Assessment and Management []  - 0 Ostomy Care Assessment and Management (repouching, etc.) PROCESS - Coordination of Care []  - 0 Simple Patient / Family Education for ongoing care []  - 0 Complex (extensive) Patient / Family Education for ongoing care []  - 0 Staff obtains Chiropractor, Records, T Results / Process Orders est []  - 0 Staff telephones HHA, Nursing Homes / Clarify orders / etc []  - 0 Routine Transfer to another Facility (non-emergent condition) []  - 0 Routine Hospital Admission (non-emergent condition) []  - 0 New Admissions / Manufacturing engineer / Ordering NPWT Apligraf, etc. , []  - 0 Emergency Hospital Admission (emergent condition) PROCESS - Special Needs []  - 0 Pediatric / Minor Patient Management []  - 0 Isolation Patient Management []  - 0 Hearing / Language / Visual special needs []  - 0 Assessment of Community assistance (transportation, D/C planning, etc.) []  - 0 Additional assistance / Altered mentation []  - 0 Support Surface(s) Assessment (bed, cushion, seat, etc.) INTERVENTIONS - Miscellaneous []  - 0 External ear exam []  - 0 Patient Transfer (multiple staff / Nurse, adult / Similar devices) []  - 0 Simple Staple / Suture removal (25 or less) []  - 0 Complex Staple / Suture removal (26 or more) []  - 0 Hypo/Hyperglycemic Management (do not check if billed separately) []  - 0 Ankle / Brachial Index (ABI) - do not check if billed separately Has the  patient been seen at the hospital within the last three years: Yes Total Score: 0 Level Of  Care: ____ Electronic Signature(s) Signed: 08/15/2022 4:55:07 PM By: Midge Aver MSN RN CNS WTA Entered By: Midge Aver on 08/15/2022 16:15:00 -------------------------------------------------------------------------------- Encounter Discharge Information Details Patient Name: Date of Service: [name], [name]. 08/15/2022 3:30 PM Medical Record Number: [id number] Patient Account Number: [id number] Date of Birth/Sex: Treating RN: 1933-05-16 (89 y.o. [name] Primary Care : Bethann Punches Other Clinician: Referring : Treating /Extender: [name] in Treatment: [address], [place] Robertson ([id number]) [id number] Page 3 of 8 Encounter Discharge Information Items Post Procedure Vitals Discharge Condition: Stable Temperature (F): 97.5 Ambulatory Status: Ambulatory Pulse (bpm): 85 Discharge Destination: Home Respiratory Rate (breaths/min): 18 Transportation: Private Auto Blood Pressure (mmHg): 116/65 Accompanied By: sister in law Schedule Follow-up Appointment: Yes Clinical Summary of Care: Electronic Signature(s) Signed: 08/15/2022 4:55:07 PM By: Midge Aver MSN RN CNS WTA Previous Signature: 08/15/2022 4:02:26 PM Version By: Midge Aver MSN RN CNS WTA Entered By: Midge Aver on 08/15/2022 16:27:35 -------------------------------------------------------------------------------- Lower Extremity Assessment Details Patient Name: Date of Service: [name], [name]. 08/15/2022 3:30 PM Medical Record Number: [id number] Patient Account Number: [id number] Date of Birth/Sex: Treating RN: 01/11/1933 (89 y.o. [name] Primary Care : Bethann Punches Other Clinician: Referring : Treating /Extender: [name] in Treatment: 12 Electronic Signature(s) Signed: 08/15/2022 3:51:33 PM By: Midge Aver  MSN RN CNS WTA Signed: 08/15/2022 4:18:36 PM By: Angelina Pih Entered By: Midge Aver on 08/15/2022 15:51:33 -------------------------------------------------------------------------------- Multi Wound Chart Details Patient Name: Date of Service: [name], [name]. 08/15/2022 3:30 PM Medical Record Number: [id number] Patient Account Number: [id number] Date of Birth/Sex: Treating RN: 01-18-1933 (89 y.o. [name] Primary Care : Bethann Punches Other Clinician: Referring : Treating /Extender: [name] in Treatment: 12 Vital Signs Height(in): 62 Pulse(bpm): 85 Weight(lbs): 118 Blood Pressure(mmHg): 119/65 Body Mass Index(BMI): 21.6 Temperature(F): 97.5 Respiratory Rate(breaths/min): 18 [name], [name] ([id number]) [1:Photos:] [N/Robertson:N/Robertson] Right Calcaneus N/Robertson N/Robertson Wound Location: Pressure Injury N/Robertson N/Robertson Wounding Event: Pressure Ulcer N/Robertson N/Robertson Primary Etiology: Hypertension, Rheumatoid Arthritis N/Robertson N/Robertson Comorbid History: 03/25/2022 N/Robertson N/Robertson Date Acquired: 12 N/Robertson N/Robertson Weeks of Treatment: Open N/Robertson N/Robertson Wound Status: Monica N/Robertson N/Robertson Wound Recurrence: 1.1x1x0.6 N/Robertson N/Robertson Measurements L x W x D (cm) 0.864 N/Robertson N/Robertson Robertson (cm) : rea 0.518 N/Robertson N/Robertson Volume (cm) : 56.90% N/Robertson N/Robertson % Reduction in Robertson rea: -159.00% N/Robertson N/Robertson % Reduction in Volume: Category/Stage IV N/Robertson N/Robertson Classification: Medium N/Robertson N/Robertson Exudate Robertson mount: Serosanguineous N/Robertson N/Robertson Exudate Type: red, brown N/Robertson N/Robertson Exudate Color: None Present (0%) N/Robertson N/Robertson Granulation Robertson mount: Large (67-100%) N/Robertson N/Robertson Necrotic Robertson mount: Fat Layer (Subcutaneous Tissue): Yes N/Robertson N/Robertson Exposed Structures: Fascia: Monica Tendon: Monica Muscle: Monica Joint: Monica Bone: Monica None N/Robertson N/Robertson Epithelialization: Treatment Notes Electronic Signature(s) Signed: 08/15/2022 3:51:42 PM By: Midge Aver MSN RN CNS WTA Entered By: Midge Aver on 08/15/2022  15:51:42 -------------------------------------------------------------------------------- Multi-Disciplinary Care Plan Details Patient Name: Date of Service: [name], [name]. 08/15/2022 3:30 PM Medical Record Number: [id number] Patient Account Number: [id number] Date of Birth/Sex: Treating RN: 30-Apr-1933 (89 y.o. [name] Primary Care : Bethann Punches Other Clinician: Referring : Treating /Extender: [name] in Treatment: 12 Active Inactive Necrotic Tissue Nursing Diagnoses: Impaired tissue integrity related to necrotic/devitalized tissue Knowledge deficit related to management of necrotic/devitalized tissue Goals: Necrotic/devitalized tissue will be minimized in the wound bed [name], [name] ([id number]) [id number] Page 5 of 8 Date Initiated: 05/22/2022 Date Inactivated:  06/27/2022 Target Resolution Date: 06/22/2022 Goal Status: Met Patient/caregiver will verbalize understanding of reason and process for debridement of necrotic tissue Date Initiated: 05/22/2022 Target Resolution Date: 08/24/2022 Goal Status: Active Interventions: Assess patient pain level pre-, during and post procedure and prior to discharge Provide education on necrotic tissue and debridement process Treatment Activities: Apply topical anesthetic as ordered : 05/22/2022 Enzymatic debridement : 05/22/2022 Notes: Wound/Skin Impairment Nursing Diagnoses: Impaired tissue integrity Knowledge deficit related to ulceration/compromised skin integrity Goals: Patient/caregiver will verbalize understanding of skin care regimen Date Initiated: 05/22/2022 Date Inactivated: 06/27/2022 Target Resolution Date: 06/22/2022 Goal Status: Met Ulcer/skin breakdown will have Robertson volume reduction of 30% by week 4 Date Initiated: 05/22/2022 Date Inactivated: 06/27/2022 Target Resolution Date: 06/22/2022 Goal Status: Met Ulcer/skin breakdown will have Robertson volume  reduction of 50% by week 8 Date Initiated: 05/22/2022 Date Inactivated: 07/18/2022 Target Resolution Date: 07/22/2022 Goal Status: Met Ulcer/skin breakdown will have Robertson volume reduction of 80% by week 12 Date Initiated: 05/22/2022 Date Inactivated: 08/15/2022 Target Resolution Date: 08/22/2022 Goal Status: Met Ulcer/skin breakdown will heal within 14 weeks Date Initiated: 05/22/2022 Target Resolution Date: 09/05/2022 Goal Status: Active Interventions: Assess patient/caregiver ability to obtain necessary supplies Assess patient/caregiver ability to perform ulcer/skin care regimen upon admission and as needed Assess ulceration(s) every visit Provide education on ulcer and skin care Treatment Activities: Referred to DME  for dressing supplies : 05/22/2022 Skin care regimen initiated : 05/22/2022 Topical wound management initiated : 05/22/2022 Notes: Electronic Signature(s) Signed: 08/15/2022 3:53:12 PM By: Midge Aver MSN RN CNS WTA Entered By: Midge Aver on 08/15/2022 15:53:12 -------------------------------------------------------------------------------- Pain Assessment Details Patient Name: Date of Service: [name], [name]. 08/15/2022 3:30 PM Medical Record Number: [id number] Patient Account Number: [id number] Date of Birth/Sex: Treating RN: 1933-01-24 (89 y.o. [name] Primary Care : Bethann Punches Other Clinician: JOSALYNN, [name] ([id number]) [id number] Page 6 of 8 Referring : Treating /Extender: [name] in Treatment: 12 Active Problems Location of Pain Severity and Description of Pain Patient Has Paino Monica Site Locations Rate the pain. Current Pain Level: 0 Pain Management and Medication Current Pain Management: Electronic Signature(s) Signed: 08/15/2022 3:51:04 PM By: Midge Aver MSN RN CNS WTA Signed: 08/15/2022 4:18:36 PM By: Angelina Pih Entered By: Midge Aver on 08/15/2022  15:51:04 -------------------------------------------------------------------------------- Patient/Caregiver Education Details Patient Name: Date of Service: [name], [name]. 8/6/2024andnbsp3:30 PM Medical Record Number: [id number] Patient Account Number: [id number] Date of Birth/Gender: Treating RN: Dec 04, 1933 (89 y.o. [name] Primary Care Physician: Bethann Punches Other Clinician: Referring Physician: Treating Physician/Extender: [name] in Treatment: 12 Education Assessment Education Provided To: Patient Education Topics Provided Wound/Skin Impairment: Handouts: Caring for Your Ulcer Methods: Explain/Verbal Responses: State content correctly Electronic Signature(s) Signed: 08/15/2022 4:55:07 PM By: Midge Aver MSN RN CNS [address], [place] ([id number]) [id number] Page 7 of 8 Entered By: Midge Aver on 08/15/2022 16:01:29 -------------------------------------------------------------------------------- Wound Assessment Details Patient Name: Date of Service: [facility], [name]. 08/15/2022 3:30 PM Medical Record Number: [id number] Patient Account Number: [id number] Date of Birth/Sex: Treating RN: 1933-03-21 (89 y.o. [name] Primary Care : Bethann Punches Other Clinician: Referring : Treating /Extender: [name] in Treatment: 12 Wound Status Wound Number: 1 Primary Etiology: Pressure Ulcer Wound Location: Right Calcaneus Wound Status: Open Wounding Event: Pressure Injury Comorbid History: Hypertension, Rheumatoid Arthritis Date Acquired: 03/25/2022 Weeks Of Treatment: 12 Clustered Wound: Monica Photos Wound Measurements Length: (cm) 1.1 Width: (cm) 1 Depth: (cm)  0.6 Area: (cm) 0.864 Volume: (cm) 0.518 % Reduction in Area: 56.9% % Reduction in Volume: -159% Epithelialization: None Tunneling: Monica Undermining: Monica Wound Description Classification: Category/Stage  IV Exudate Amount: Medium Exudate Type: Serosanguineous Exudate Color: red, brown Foul Odor After Cleansing: Monica Slough/Fibrino Yes Wound Bed Granulation Amount: None Present (0%) Exposed Structure Necrotic Amount: Large (67-100%) Fascia Exposed: Monica Necrotic Quality: Adherent Slough Fat Layer (Subcutaneous Tissue) Exposed: Yes Tendon Exposed: Monica Muscle Exposed: Monica Joint Exposed: Monica Bone Exposed: Monica Treatment Notes Wound #1 (Calcaneus) Wound Laterality: Right [name], [name] ([id number]) [id number] Page 8 of 8 [employer] Ancillary Kit - 15 Day Supply Discharge Instruction: Use supplies as instructed; Kit contains: (15) Saline Bullets; (15) 3x3 Gauze; 15 pr Gloves Soap and Water Discharge Instruction: Gently cleanse wound with antibacterial soap, rinse and pat dry prior to dressing wounds Peri-Wound Care Topical Primary Dressing Hydrofera Blue Ready Transfer Foam, 2.5x2.5 (in/in) Discharge Instruction: Apply Hydrofera Blue Ready to wound bed as directed T Adhesive [employer], 4x4 (in/in) elfa Secondary Dressing Secured With Compression Wrap Compression Stockings Add-Ons Electronic Signature(s) Signed: 08/15/2022 4:18:36 PM By: Angelina Pih Entered By: Angelina Pih on 08/15/2022 15:33:59 -------------------------------------------------------------------------------- Vitals Details Patient Name: Date of Service: [name], [name]. 08/15/2022 3:30 PM Medical Record Number: [id number] Patient Account Number: [id number] Date of Birth/Sex: Treating RN: 1933/06/28 (89 y.o. [name] Primary Care : Bethann Punches Other Clinician: Referring : Treating /Extender: [name] in Treatment: 12 Vital Signs Time Taken: 15:26 Temperature (F): 97.5 Height (in): 62 Pulse (bpm): 85 Weight (lbs): 118 Respiratory Rate (breaths/min): 18 Body Mass Index (BMI): 21.6 Blood Pressure (mmHg):  119/65 Reference Range: 80 - 120 mg / dl Electronic Signature(s) Signed: 08/15/2022 3:51:00 PM By: Midge Aver MSN RN CNS WTA Entered By: Midge Aver on 08/15/2022 15:51:00

## 2022-08-15 NOTE — Progress Notes (Signed)
[name] ([id number]) [id number] Page 1 of 7 Visit Report for 08/15/2022 Chief Complaint Document Details Patient Name: Date of Service: [facility], [name] 08/15/2022 3:30 PM Medical Record Number: [id number] Patient Account Number: [id number] Date of Birth/Sex: Treating RN: [birth year] (89 y.o. [name] Primary Care Provider: Bethann Punches Other Clinician: Referring Provider: Treating Provider/Extender: [name] in Treatment: 12 Information Obtained from: Patient Chief Complaint Right heel pressure ulcer Electronic Signature(s) Signed: 08/15/2022 3:36:29 PM By: Allen Derry PA-C Entered By: Allen Derry on 08/15/2022 15:36:29 -------------------------------------------------------------------------------- Debridement Details Patient Name: Date of Service: [name], [name] 08/15/2022 3:30 PM Medical Record Number: [id number] Patient Account Number: [id number] Date of Birth/Sex: Treating RN: 1933-12-29 (89 y.o. [name] Primary Care Provider: Bethann Punches Other Clinician: Referring Provider: Treating Provider/Extender: [name] in Treatment: 12 Debridement Performed for Assessment: Wound #1 Right Calcaneus Performed By: Physician Allen Derry, PA-C Debridement Type: Debridement Level of Consciousness (Pre-procedure): Awake and Alert Pre-procedure Verification/Time Out Yes - 16:12 Taken: Start Time: 16:12 Pain Control: Lidocaine 4% T opical Solution Percent of Wound Bed Debrided: 100% T Area Debrided (cm): otal 0.86 Tissue and other material debrided: Viable, Non-Viable, Slough, Subcutaneous, Slough Level: Skin/Subcutaneous Tissue Debridement Description: Excisional Instrument: Curette Bleeding: Minimum Hemostasis Achieved: Pressure Procedural Pain: 0 Post Procedural Pain: 0 Response to Treatment: Procedure was tolerated well [name], [name] ([id number]) [id number] Page  2 of 7 Level of Consciousness (Post- Awake and Alert procedure): Post Debridement Measurements of Total Wound Length: (cm) 1.1 Stage: Category/Stage IV Width: (cm) 1 Depth: (cm) 0.6 Volume: (cm) 0.518 Character of Wound/Ulcer Post Debridement: Stable Post Procedure Diagnosis Same as Pre-procedure Electronic Signature(s) Signed: 08/15/2022 4:55:07 PM By: Midge Aver MSN RN CNS WTA Signed: 08/15/2022 6:05:17 PM By: Allen Derry PA-C Entered By: Midge Aver on 08/15/2022 16:14:05 -------------------------------------------------------------------------------- HPI Details Patient Name: Date of Service: [name], [name] 08/15/2022 3:30 PM Medical Record Number: [id number] Patient Account Number: [id number] Date of Birth/Sex: Treating RN: 1933-09-04 (89 y.o. [name] Primary Care Provider: Bethann Punches Other Clinician: Referring Provider: Treating Provider/Extender: [name] in Treatment: 12 History of Present Illness HPI Description: 05-22-2022 upon evaluation today patient appears to be having issues with the wound on her right heel. This actually occurred during a surgical intervention for her left knee she had a knee replacement. She went in with no issues and when she got home or even while she was staying overnight before being discharged home noted to have an issue with a discolored area on her right heel. She is unsure how this happened but knows it was not present prior to the surgery. She has been monitoring since it is now an eschar covered region that obviously is going to need to be removed in order to allow this to heal. With that being said her ABIs were not registering very well NuShield today it appears that she may have a issue with good arterial flow this could be somewhat accounting for what is going on with the heel. Nonetheless I do think she is probably going to be seen by a vein and vascular for arterial studies. She had the surgery on Friday  and came home on a Saturday. This was a very short stay. Patient does have a history of rheumatoid arthritis but really no other major medical problems. 05-29-2022 upon evaluation today patient appears to be doing well currently in regard to her wound. In fact she  is telling me that she is doing quite well at this point. Fortunately there does not appear to be any signs of active infection locally nor systemically at this time which is great news. No fevers, chills, nausea, vomiting, or diarrhea. The good news that she did get the Santyl although it took until Friday. 06-15-2022 upon evaluation today patient appears to be doing well currently in regard to her heel ulcer. We are slowly getting this to loosen up and clearway. Fortunately I do not see any signs of active infection at this time which is great news I think that we are definitely ready to clear away some of the necrotic debris today. 06-20-2022 upon evaluation today patient's wound is actually showing signs of good improvement with regard to the heel. She has been tolerating the dressing changes without complication and currently I am going to recommend that we go ahead and continue with the plan using the Santyl to help clean up the surface of this wound. Really she seems to be doing quite well with that. 06-27-2022 upon evaluation patient actually appears to be doing better she is slowly showing signs of improvement. With that being said the wound is a bit deeper but this is only because we are getting to the base of the wound as far as cleaning out the necrotic tissue is concerned. Fortunately I think the Santyl is loosening things up so we can actually do this. I think we are going to switch to using Dakin's moistened gauze over top of the Santyl as of today. 07-04-2022 upon evaluation today patient appears to be doing well currently in regard to her wound. She has been tolerating the dressing changes without complication. Fortunately I do  not see any signs of active infection at this time which is great news and in general I do believe that removing in the appropriate direction here. She is going to require some debridement today. 07-18-2022 upon evaluation today patient appears to be doing well currently in regard to her wound. In fact this is showing signs of improvement which is great news. Fortunately I do not see any evidence of active infection locally or systemically which is great news. No fevers, chills, nausea, vomiting, or diarrhea. 07-25-2022 upon evaluation today patient appears to be doing well currently in regard to her heel. She is slowly making good progress towards complete closure which is excellent news. I do not see any signs of active infection at this time which is great news as well. 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 We have been using Santyl Dakin's gauze Zetuvit and a border foam. [name], [name] ([id number]) [id number] Page 3 of 7 She is making nice progress 08-08-2022 upon evaluation today patient appears to be doing well currently in regard to her wound. She has been tolerating the dressing changes without complication. With that being said I think it may be time for [place] to go and make a switch here as far as the wound is concerned to something different. She is in agreement with this plan I think Hydrofera Blue's can be an also option at this point. 08-15-2022 upon evaluation today patient appears to be doing well currently in regard to her heel ulcer. This is actually showing signs of improvement and looks much better I am actually very pleased with where we stand today. She has been required sharp debridement to clear away the necrotic debris. Electronic Signature(s) Signed: 08/15/2022 5:29:53 PM By:  Allen Derry PA-C Entered By: Allen Derry on 08/15/2022  17:29:53 -------------------------------------------------------------------------------- Physical Exam Details Patient Name: Date of Service: [name], [name] 08/15/2022 3:30 PM Medical Record Number: [id number] Patient Account Number: [id number] Date of Birth/Sex: Treating RN: [birth year] (89 y.o. [name] Primary Care Provider: Bethann Punches Other Clinician: Referring Provider: Treating Provider/Extender: [name] in Treatment: 12 Constitutional Well-nourished and well-hydrated in no acute distress. Respiratory normal breathing without difficulty. Psychiatric this patient is able to make decisions and demonstrates good insight into disease process. Alert and Oriented x 3. pleasant and cooperative. Notes Upon inspection patient's wound bed actually showed signs of good granulation and eipthelization at this point. Fortunately I think she is actually making excellent progress the heel is looking much better I like how the [facility] is doing. She is in agreement with continuing with this plan. Electronic Signature(s) Signed: 08/15/2022 5:35:43 PM By: Allen Derry PA-C Entered By: Allen Derry on 08/15/2022 17:35:43 -------------------------------------------------------------------------------- Physician Orders Details Patient Name: Date of Service: [name], [name] 08/15/2022 3:30 PM Medical Record Number: [id number] Patient Account Number: [id number] Date of Birth/Sex: Treating RN: 1933/04/28 (89 y.o. [name] Primary Care Provider: Bethann Punches Other Clinician: Referring Provider: Treating Provider/Extender: [name] in Treatment: [address] [place] J ([id number]) [id number] Page 4 of 7 Verbal / Phone Orders: No Diagnosis Coding ICD-10 Coding Code Description L89.610 Pressure ulcer of right heel, unstageable M05.80 Other rheumatoid arthritis with rheumatoid factor of unspecified site Follow-up  Appointments Return Appointment in 1 week. Bathing/ [employer] wounds with antibacterial soap and water. Doylene Canning dial Anesthetic (Use 'Patient Medications' Section for Anesthetic Order Entry) Lidocaine applied to wound bed Wound Treatment Wound #1 - Calcaneus Wound Laterality: Right Cleanser: Byram Ancillary Kit - 15 Day Supply 1 x Per Day/30 Days Discharge Instructions: Use supplies as instructed; Kit contains: (15) Saline Bullets; (15) 3x3 Gauze; 15 pr Gloves Cleanser: Soap and Water 1 x Per Day/30 Days Discharge Instructions: Gently cleanse wound with antibacterial soap, rinse and pat dry prior to dressing wounds Prim Dressing: Hydrofera Blue Ready Transfer Foam, 2.5x2.5 (in/in) 1 x Per Day/30 Days ary Discharge Instructions: Apply Hydrofera Blue Ready to wound bed as directed Secondary Dressing: (BORDER) Zetuvit Plus SILICONE BORDER Dressing 5x5 (in/in) 1 x Per Day/30 Days Discharge Instructions: Please do not put silicone bordered dressings under wraps. Use non-bordered dressing only. Electronic Signature(s) Signed: 08/15/2022 4:55:07 PM By: Midge Aver MSN RN CNS WTA Signed: 08/15/2022 6:05:17 PM By: Allen Derry PA-C Previous Signature: 08/15/2022 3:52:39 PM Version By: Midge Aver MSN RN CNS WTA Entered By: Midge Aver on 08/15/2022 16:14:53 -------------------------------------------------------------------------------- Problem List Details Patient Name: Date of Service: [name], [name] 08/15/2022 3:30 PM Medical Record Number: [id number] Patient Account Number: [id number] Date of Birth/Sex: Treating RN: Mar 09, 1933 (89 y.o. [name] Primary Care Provider: Bethann Punches Other Clinician: Referring Provider: Treating Provider/Extender: [name] in Treatment: 12 Active Problems ICD-10 Encounter Code Description Active Date MDM Diagnosis L89.610 Pressure ulcer of right heel, unstageable 05/22/2022 No Yes M05.80 Other rheumatoid arthritis with  rheumatoid factor of unspecified site 05/22/2022 No Yes [name], [name] ([id number]) [id number] Page 5 of 7 Inactive Problems Resolved Problems Electronic Signature(s) Signed: 08/15/2022 4:01:39 PM By: Midge Aver MSN RN CNS WTA Signed: 08/15/2022 6:05:17 PM By: Allen Derry PA-C Previous Signature: 08/15/2022 3:36:26 PM Version By: Allen Derry PA-C Entered By: Midge Aver on 08/15/2022 16:01:39 -------------------------------------------------------------------------------- Progress Note Details  Patient Name: Date of Service: [facility], [name] 08/15/2022 3:30 PM Medical Record Number: [id number] Patient Account Number: [id number] Date of Birth/Sex: Treating RN: [birth year] (89 y.o. [name] Primary Care Provider: Bethann Punches Other Clinician: Referring Provider: Treating Provider/Extender: [name] in Treatment: 12 Subjective Chief Complaint Information obtained from Patient Right heel pressure ulcer History of Present Illness (HPI) 05-22-2022 upon evaluation today patient appears to be having issues with the wound on her right heel. This actually occurred during a surgical intervention for her left knee she had a knee replacement. She went in with no issues and when she got home or even while she was staying overnight before being discharged home noted to have an issue with a discolored area on her right heel. She is unsure how this happened but knows it was not present prior to the surgery. She has been monitoring since it is now an eschar covered region that obviously is going to need to be removed in order to allow this to heal. With that being said her ABIs were not registering very well NuShield today it appears that she may have a issue with good arterial flow this could be somewhat accounting for what is going on with the heel. Nonetheless I do think she is probably going to be seen by a vein and vascular for arterial studies. She had  the surgery on 09-07-2022 and came home on a Saturday. This was a very short stay. Patient does have a history of rheumatoid arthritis but really no other major medical problems. 05-29-2022 upon evaluation today patient appears to be doing well currently in regard to her wound. In fact she is telling me that she is doing quite well at this point. Fortunately there does not appear to be any signs of active infection locally nor systemically at this time which is great news. No fevers, chills, nausea, vomiting, or diarrhea. The good news that she did get the Santyl although it took until Sep 07, 2022. 06-15-2022 upon evaluation today patient appears to be doing well currently in regard to her heel ulcer. We are slowly getting this to loosen up and clearway. Fortunately I do not see any signs of active infection at this time which is great news I think that we are definitely ready to clear away some of the necrotic debris today. 06-20-2022 upon evaluation today patient's wound is actually showing signs of good improvement with regard to the heel. She has been tolerating the dressing changes without complication and currently I am going to recommend that we go ahead and continue with the plan using the Santyl to help clean up the surface of this wound. Really she seems to be doing quite well with that. 06-27-2022 upon evaluation patient actually appears to be doing better she is slowly showing signs of improvement. With that being said the wound is a bit deeper but this is only because we are getting to the base of the wound as far as cleaning out the necrotic tissue is concerned. Fortunately I think the Santyl is loosening things up so we can actually do this. I think we are going to switch to using Dakin's moistened gauze over top of the Santyl as of today. 07-04-2022 upon evaluation today patient appears to be doing well currently in regard to her wound. She has been tolerating the dressing changes  without complication. Fortunately I do not see any signs of active infection at this time which is great news and in  general I do believe that removing in the appropriate direction here. She is going to require some debridement today. 07-18-2022 upon evaluation today patient appears to be doing well currently in regard to her wound. In fact this is showing signs of improvement which is great news. Fortunately I do not see any evidence of active infection locally or systemically which is great news. No fevers, chills, nausea, vomiting, or diarrhea. 07-25-2022 upon evaluation today patient appears to be doing well currently in regard to her heel. She is slowly making good progress towards complete closure which is excellent news. I do not see any signs of active infection at this time which is great news as well. 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 We have been using Santyl Dakin's gauze Zetuvit and a border foam. She is making nice progress 08-08-2022 upon evaluation today patient appears to be doing well currently in regard to her wound. She has been tolerating the dressing changes without [name], [name] ([id number]) [id number] Page 6 of 7 complication. With that being said I think it may be time for [place] to go and make a switch here as far as the wound is concerned to something different. She is in agreement with this plan I think Hydrofera Blue's can be an also option at this point. 08-15-2022 upon evaluation today patient appears to be doing well currently in regard to her heel ulcer. This is actually showing signs of improvement and looks much better I am actually very pleased with where we stand today. She has been required sharp debridement to clear away the necrotic debris. Objective Constitutional Well-nourished and well-hydrated in no acute distress. Vitals Time Taken: 3:26 PM, Height: 62 in, Weight: 118 lbs, BMI:  21.6, Temperature: 97.5 F, Pulse: 85 bpm, Respiratory Rate: 18 breaths/min, Blood Pressure: 119/65 mmHg. Respiratory normal breathing without difficulty. Psychiatric this patient is able to make decisions and demonstrates good insight into disease process. Alert and Oriented x 3. pleasant and cooperative. General Notes: Upon inspection patient's wound bed actually showed signs of good granulation and eipthelization at this point. Fortunately I think she is actually making excellent progress the heel is looking much better I like how the [facility] is doing. She is in agreement with continuing with this plan. Integumentary (Hair, Skin) Wound #1 status is Open. Original cause of wound was Pressure Injury. The date acquired was: 03/25/2022. The wound has been in treatment 12 weeks. The wound is located on the Right Calcaneus. The wound measures 1.1cm length x 1cm width x 0.6cm depth; 0.864cm^2 area and 0.518cm^3 volume. There is Fat Layer (Subcutaneous Tissue) exposed. There is no tunneling or undermining noted. There is a medium amount of serosanguineous drainage noted. There is no granulation within the wound bed. There is a large (67-100%) amount of necrotic tissue within the wound bed including Adherent Slough. Assessment Active Problems ICD-10 Pressure ulcer of right heel, unstageable Other rheumatoid arthritis with rheumatoid factor of unspecified site Procedures Wound #1 Pre-procedure diagnosis of Wound #1 is a Pressure Ulcer located on the Right Calcaneus . There was a Excisional Skin/Subcutaneous Tissue Debridement with a total area of 0.86 sq cm performed by Allen Derry, PA-C. With the following instrument(s): Curette to remove Viable and Non-Viable tissue/material. Material removed includes Subcutaneous Tissue and Slough and after achieving pain control using Lidocaine 4% T opical Solution. No specimens were taken. A time out was conducted at 16:12, prior to the start of the  procedure. A Minimum amount of bleeding was controlled with Pressure. The procedure was tolerated well with a pain level of 0 throughout and a pain level of 0 following the procedure. Post Debridement Measurements: 1.1cm length x 1cm width x 0.6cm depth; 0.518cm^3 volume. Post debridement Stage noted as Category/Stage IV. Character of Wound/Ulcer Post Debridement is stable. Post procedure Diagnosis Wound #1: Same as Pre-Procedure Plan Follow-up Appointments: Return Appointment in 1 week. Bathing/ Shower/ Hygiene: Wash wounds with antibacterial soap and water. Doylene Canning dial Anesthetic (Use 'Patient Medications' Section for Anesthetic Order Entry): Lidocaine applied to wound bed WOUND #1: - Calcaneus Wound Laterality: Right Cleanser: Byram Ancillary Kit - 15 Day Supply 1 x Per Day/30 Days Discharge Instructions: Use supplies as instructed; Kit contains: (15) Saline Bullets; (15) 3x3 Gauze; 15 pr Gloves Cleanser: Soap and Water 1 x Per Day/30 Days Discharge Instructions: Gently cleanse wound with antibacterial soap, rinse and pat dry prior to dressing wounds Prim Dressing: Hydrofera Blue Ready Transfer Foam, 2.5x2.5 (in/in) 1 x Per Day/30 Days ary Discharge Instructions: Apply Hydrofera Blue Ready to wound bed as directed [name], [name] ([id number]) [id number] Page 7 of 7 Secondary Dressing: (BORDER) Zetuvit Plus SILICONE BORDER Dressing 5x5 (in/in) 1 x Per Day/30 Days Discharge Instructions: Please do not put silicone bordered dressings under wraps. Use non-bordered dressing only. 1. Based on what I am seeing I am going to recommend that we have the patient continue with the [facility] which seems to be doing quite well. 2. I am also going to recommend the patient continue to monitor for any signs of infection or worsening but right now really think she is doing quite well. We will see patient back for reevaluation in 1 week here in the clinic. If anything worsens  or changes patient will contact our office for additional recommendations. Electronic Signature(s) Signed: 08/15/2022 5:51:36 PM By: Allen Derry PA-C Entered By: Allen Derry on 08/15/2022 17:51:36 -------------------------------------------------------------------------------- SuperBill Details Patient Name: Date of Service: [name], [name] 08/15/2022 Medical Record Number: [id number] Patient Account Number: [id number] Date of Birth/Sex: Treating RN: 1933/02/02 (89 y.o. [name] Primary Care Provider: Bethann Punches Other Clinician: Referring Provider: Treating Provider/Extender: [name] in Treatment: 12 Diagnosis Coding ICD-10 Codes Code Description L89.610 Pressure ulcer of right heel, unstageable M05.80 Other rheumatoid arthritis with rheumatoid factor of unspecified site Facility Procedures : CPT4 Code: [id number] Description: 11042 - DEB SUBQ TISSUE 20 SQ CM/< ICD-10 Diagnosis Description L89.610 Pressure ulcer of right heel, unstageable Modifier: Quantity: 1 Physician Procedures : CPT4 Code Description Modifier [id number] 11042 - WC PHYS SUBQ TISS 20 SQ CM ICD-10 Diagnosis Description L89.610 Pressure ulcer of right heel, unstageable Quantity: 1 Electronic Signature(s) Signed: 08/15/2022 5:52:01 PM By: Allen Derry PA-C Entered By: Allen Derry on 08/15/2022 17:52:01

## 2022-08-22 ENCOUNTER — Encounter: Payer: Medicare Other | Admitting: Physician Assistant

## 2022-08-22 DIAGNOSIS — L89514 Pressure ulcer of right ankle, stage 4: Secondary | ICD-10-CM | POA: Diagnosis not present

## 2022-08-22 NOTE — Progress Notes (Signed)
[name], [name] ([id number]) [phone].pdf Page 1 of 9 Visit Report for 08/22/2022 Arrival Information Details Patient Name: Date of Service: [facility], [name]. 08/22/2022 1:15 PM Medical Record Number: [id number] Patient Account Number: [id number] Date of Birth/Sex: Treating RN: Jan 28, 1933 (89 y.o. [name] Primary Care : Bethann Punches Other Clinician: Referring : Treating /Extender: [name] in Treatment: 13 Visit Information History Since Last Visit Added or deleted any medications: Monica Patient Arrived: Ambulatory Any new allergies or adverse reactions: Monica Arrival Time: 13:44 Had a fall or experienced change in Monica Accompanied By: daughter activities of daily living that may affect Transfer Assistance: None risk of falls: Patient Identification Verified: Yes Hospitalized since last visit: Monica Secondary Verification Process Completed: Yes Has Dressing in Place as Prescribed: Yes Patient Requires Transmission-Based Precautions: Monica Pain Present Now: Monica Patient Has Alerts: Yes Patient Alerts: NOT Diabetic PAD Bilateral ABI L1.08TBI .70 05/25/22 ABI R .97 TBI .70 05/25/22 Electronic Signature(s) Signed: 08/22/2022 2:40:59 PM By: Midge Aver MSN RN CNS WTA Entered By: Midge Aver on 08/22/2022 14:40:58 -------------------------------------------------------------------------------- Clinic Level of Care Assessment Details Patient Name: Date of Service: [facility], [name]. 08/22/2022 1:15 PM Medical Record Number: [id number] Patient Account Number: [id number] Date of Birth/Sex: Treating RN: 1933/09/24 (89 y.o. [name] Primary Care : Bethann Punches Other Clinician: Referring : Treating /Extender: [name] in Treatment: 13 Clinic Level of Care Assessment Items TOOL 1 Quantity Score []  - 0 Use when EandM and Procedure is performed on INITIAL visit ASSESSMENTS -  Nursing Assessment / Reassessment []  - 0 General Physical Exam (combine w/ comprehensive assessment (listed just below) when performed on new pt. evals) []  - 0 Comprehensive Assessment (HX, ROS, Risk Assessments, Wounds Hx, etc.) ASSESSMENTS - Wound and Skin Assessment / Reassessment AKAYA, HAINS ([id number]) [id number].pdf Page 2 of 9 []  - 0 Dermatologic / Skin Assessment (not related to wound area) ASSESSMENTS - Ostomy and/or Continence Assessment and Care []  - 0 Incontinence Assessment and Management []  - 0 Ostomy Care Assessment and Management (repouching, etc.) PROCESS - Coordination of Care []  - 0 Simple Patient / Family Education for ongoing care []  - 0 Complex (extensive) Patient / Family Education for ongoing care []  - 0 Staff obtains Chiropractor, Records, T Results / Process Orders est []  - 0 Staff telephones HHA, Nursing Homes / Clarify orders / etc []  - 0 Routine Transfer to another Facility (non-emergent condition) []  - 0 Routine Hospital Admission (non-emergent condition) []  - 0 New Admissions / Manufacturing engineer / Ordering NPWT Apligraf, etc. , []  - 0 Emergency Hospital Admission (emergent condition) PROCESS - Special Needs []  - 0 Pediatric / Minor Patient Management []  - 0 Isolation Patient Management []  - 0 Hearing / Language / Visual special needs []  - 0 Assessment of Community assistance (transportation, D/C planning, etc.) []  - 0 Additional assistance / Altered mentation []  - 0 Support Surface(s) Assessment (bed, cushion, seat, etc.) INTERVENTIONS - Miscellaneous []  - 0 External ear exam []  - 0 Patient Transfer (multiple staff / Nurse, adult / Similar devices) []  - 0 Simple Staple / Suture removal (25 or less) []  - 0 Complex Staple / Suture removal (26 or more) []  - 0 Hypo/Hyperglycemic Management (do not check if billed separately) []  - 0 Ankle / Brachial Index (ABI) - do not check if billed separately Has the  patient been seen at the hospital within the last three years: Yes Total Score: 0 Level Of  Care: ____ Electronic Signature(s) Unsigned Entered By: Midge Aver on 08/22/2022 14:42:45 -------------------------------------------------------------------------------- Encounter Discharge Information Details Patient Name: Date of Service: [name], [name]. 08/22/2022 1:15 PM Medical Record Number: [id number] Patient Account Number: [id number] Date of Birth/Sex: Treating RN: 1933-09-19 (89 y.o. [name] Primary Care : Bethann Punches Other Clinician: Referring : Treating /Extender: [name], [name] ([id number]) [id number] Page 3 of 9 Weeks in Treatment: 13 Encounter Discharge Information Items Post Procedure Vitals Discharge Condition: Stable Temperature (F): 97.8 Ambulatory Status: Ambulatory Pulse (bpm): 86 Discharge Destination: Home Respiratory Rate (breaths/min): 18 Transportation: Private Auto Blood Pressure (mmHg): 112/64 Accompanied By: sister in law Schedule Follow-up Appointment: Yes Clinical Summary of Care: Electronic Signature(s) Signed: 08/22/2022 2:44:46 PM By: Midge Aver MSN RN CNS WTA Entered By: Midge Aver on 08/22/2022 14:44:46 -------------------------------------------------------------------------------- Lower Extremity Assessment Details Patient Name: Date of Service: [name], [name]. 08/22/2022 1:15 PM Medical Record Number: [id number] Patient Account Number: [id number] Date of Birth/Sex: Treating RN: 04-May-1933 (89 y.o. [name] Primary Care : Bethann Punches Other Clinician: Referring : Treating /Extender: [name] in Treatment: 13 Electronic Signature(s) Signed: 08/22/2022 2:41:31 PM By: Midge Aver MSN RN CNS WTA Entered By: Midge Aver on 08/22/2022  14:41:31 -------------------------------------------------------------------------------- Multi Wound Chart Details Patient Name: Date of Service: [name], [name]. 08/22/2022 1:15 PM Medical Record Number: [id number] Patient Account Number: [id number] Date of Birth/Sex: Treating RN: Monica 19, 1935 (89 y.o. [name] Primary Care : Bethann Punches Other Clinician: Referring : Treating /Extender: [name] in Treatment: 13 Vital Signs Height(in): 62 Pulse(bpm): 86 Weight(lbs): 118 Blood Pressure(mmHg): 112/64 Body Mass Index(BMI): 21.6 Temperature(F): 97.8 Respiratory Rate(breaths/min): 18 [[name], [name] ([id number]):Photos:] [id number].pdf Page 4 of 9:1 N/A N/A N/A N/A] Right Calcaneus N/A N/A Wound Location: Pressure Injury N/A N/A Wounding Event: Pressure Ulcer N/A N/A Primary Etiology: Hypertension, Rheumatoid Arthritis N/A N/A Comorbid History: 03/25/2022 N/A N/A Date Acquired: 13 N/A N/A Weeks of Treatment: Open N/A N/A Wound Status: Monica N/A N/A Wound Recurrence: 0.5x0.7x0.2 N/A N/A Measurements L x W x D (cm) 0.275 N/A N/A A (cm) : rea 0.055 N/A N/A Volume (cm) : 86.30% N/A N/A % Reduction in A rea: 72.50% N/A N/A % Reduction in Volume: Category/Stage IV N/A N/A Classification: Medium N/A N/A Exudate A mount: Serosanguineous N/A N/A Exudate Type: red, brown N/A N/A Exudate Color: None Present (0%) N/A N/A Granulation A mount: Large (67-100%) N/A N/A Necrotic A mount: Fat Layer (Subcutaneous Tissue): Yes N/A N/A Exposed Structures: Fascia: Monica Tendon: Monica Muscle: Monica Joint: Monica Bone: Monica None N/A N/A Epithelialization: Debridement - Excisional N/A N/A Debridement: Pre-procedure Verification/Time Out 14:09 N/A N/A Taken: Callus, Subcutaneous, Slough N/A N/A Tissue Debrided: Skin/Subcutaneous Tissue N/A N/A Level: 0.27 N/A N/A Debridement A (sq cm): rea Curette N/A  N/A Instrument: Minimum N/A N/A Bleeding: Pressure N/A N/A Hemostasis A chieved: 0 N/A N/A Procedural Pain: 0 N/A N/A Post Procedural Pain: Procedure was tolerated well N/A N/A Debridement Treatment Response: 0.5x0.7x0.2 N/A N/A Post Debridement Measurements L x W x D (cm) 0.055 N/A N/A Post Debridement Volume: (cm) Category/Stage IV N/A N/A Post Debridement Stage: Debridement N/A N/A Procedures Performed: Treatment Notes Wound #1 (Calcaneus) Wound Laterality: Right Cleanser Byram Ancillary Kit - 15 Day Supply Discharge Instruction: Use supplies as instructed; Kit contains: (15) Saline Bullets; (15) 3x3 Gauze; 15 pr Gloves Soap and Water Discharge Instruction: Gently cleanse wound with antibacterial soap, rinse and pat dry  prior to dressing wounds Peri-Wound Care Topical Primary Dressing Hydrofera Blue Ready Transfer Foam, 2.5x2.5 (in/in) Discharge Instruction: Apply Hydrofera Blue Ready to wound bed as directed Secondary Dressing (BORDER) Zetuvit Plus SILICONE BORDER Dressing 5x5 (in/in) Discharge Instruction: Please do not put silicone bordered dressings under wraps. Use non-bordered dressing only. Secured With Compression Wrap Compression Stockings [name], [name] ([id number]) [id number] Page 5 of 9 Add-Ons Electronic Signature(s) Signed: 08/22/2022 2:41:43 PM By: Midge Aver MSN RN CNS WTA Entered By: Midge Aver on 08/22/2022 14:41:42 -------------------------------------------------------------------------------- Multi-Disciplinary Care Plan Details Patient Name: Date of Service: [name], [name]. 08/22/2022 1:15 PM Medical Record Number: [id number] Patient Account Number: [id number] Date of Birth/Sex: Treating RN: [birth year] (89 y.o. [name] Primary Care : Bethann Punches Other Clinician: Referring : Treating /Extender: [name] in Treatment: 13 Active Inactive Wound/Skin  Impairment Nursing Diagnoses: Impaired tissue integrity Knowledge deficit related to ulceration/compromised skin integrity Goals: Patient/caregiver will verbalize understanding of skin care regimen Date Initiated: 05/22/2022 Date Inactivated: 06/27/2022 Target Resolution Date: 06/22/2022 Goal Status: Met Ulcer/skin breakdown will have a volume reduction of 30% by week 4 Date Initiated: 05/22/2022 Date Inactivated: 06/27/2022 Target Resolution Date: 06/22/2022 Goal Status: Met Ulcer/skin breakdown will have a volume reduction of 50% by week 8 Date Initiated: 05/22/2022 Date Inactivated: 07/18/2022 Target Resolution Date: 07/22/2022 Goal Status: Met Ulcer/skin breakdown will have a volume reduction of 80% by week 12 Date Initiated: 05/22/2022 Date Inactivated: 08/15/2022 Target Resolution Date: 08/22/2022 Goal Status: Met Ulcer/skin breakdown will heal within 14 weeks Date Initiated: 05/22/2022 Target Resolution Date: 09/05/2022 Goal Status: Active Interventions: Assess patient/caregiver ability to obtain necessary supplies Assess patient/caregiver ability to perform ulcer/skin care regimen upon admission and as needed Assess ulceration(s) every visit Provide education on ulcer and skin care Treatment Activities: Referred to DME  for dressing supplies : 05/22/2022 Skin care regimen initiated : 05/22/2022 Topical wound management initiated : 05/22/2022 Notes: Electronic Signature(s) Signed: 08/22/2022 2:43:30 PM By: Midge Aver MSN RN CNS WTA Entered By: Midge Aver on 08/22/2022 14:43:29 [name] ([id number]) [id number] Page 6 of 9 -------------------------------------------------------------------------------- Pain Assessment Details Patient Name: Date of Service: [name], [name]. 08/22/2022 1:15 PM Medical Record Number: [id number] Patient Account Number: [id number] Date of Birth/Sex: Treating RN: 1933/06/09 (89 y.o. [name] Primary Care  : Bethann Punches Other Clinician: Referring : Treating /Extender: [name] in Treatment: 13 Active Problems Location of Pain Severity and Description of Pain Patient Has Paino Monica Site Locations Rate the pain. Current Pain Level: 0 Pain Management and Medication Current Pain Management: Electronic Signature(s) Signed: 08/22/2022 2:41:12 PM By: Midge Aver MSN RN CNS WTA Entered By: Midge Aver on 08/22/2022 14:41:11 -------------------------------------------------------------------------------- Patient/Caregiver Education Details Patient Name: Date of Service: [name], [name]. 8/13/2024andnbsp1:15 PM Medical Record Number: [id number] Patient Account Number: [id number] Date of Birth/Gender: Treating RN: 1933/05/06 (89 y.o. [name] Primary Care Physician: Bethann Punches Other Clinician: Referring Physician: Treating Physician/Extender: [name] in Treatment: [address] [place] Robertson ([id number]) [id number] Page 7 of 9 Education Assessment Education Provided To: Patient Education Topics Provided Wound Debridement: Handouts: Wound Debridement Methods: Explain/Verbal Responses: State content correctly Wound/Skin Impairment: Handouts: Caring for Your Ulcer Methods: Explain/Verbal Responses: State content correctly Electronic Signature(s) Unsigned Entered By: Midge Aver on 08/22/2022 14:43:53 -------------------------------------------------------------------------------- Wound Assessment Details Patient Name: Date of Service: [name], [name]. 08/22/2022 1:15 PM Medical Record Number: [id number] Patient Account Number: [id number] Date of Birth/Sex:  Treating RN: 02/13/1933 (89 y.o. [name] Primary Care : Bethann Punches Other Clinician: Referring : Treating /Extender: [name] in Treatment: 13 Wound Status Wound Number:  1 Primary Etiology: Pressure Ulcer Wound Location: Right Calcaneus Wound Status: Open Wounding Event: Pressure Injury Comorbid History: Hypertension, Rheumatoid Arthritis Date Acquired: 03/25/2022 Weeks Of Treatment: 13 Clustered Wound: Monica Photos Wound Measurements Length: (cm) 0.5 Width: (cm) 0.7 Depth: (cm) 0.2 Gaunce, [name] ([id number]) Area: (cm) Volume: (cm) % Reduction in Area: 86.3% % Reduction in Volume: 72.5% Epithelialization: None [id number] Page 8 of 9 0.275 0.055 Wound Description Classification: Category/Stage IV Exudate Amount: Medium Exudate Type: Serosanguineous Exudate Color: red, brown Foul Odor After Cleansing: Monica Slough/Fibrino Yes Wound Bed Granulation Amount: None Present (0%) Exposed Structure Necrotic Amount: Large (67-100%) Fascia Exposed: Monica Necrotic Quality: Adherent Slough Fat Layer (Subcutaneous Tissue) Exposed: Yes Tendon Exposed: Monica Muscle Exposed: Monica Joint Exposed: Monica Bone Exposed: Monica Treatment Notes Wound #1 (Calcaneus) Wound Laterality: Right Cleanser Byram Ancillary Kit - 15 Day Supply Discharge Instruction: Use supplies as instructed; Kit contains: (15) Saline Bullets; (15) 3x3 Gauze; 15 pr Gloves Soap and Water Discharge Instruction: Gently cleanse wound with antibacterial soap, rinse and pat dry prior to dressing wounds Peri-Wound Care Topical Primary Dressing Hydrofera Blue Ready Transfer Foam, 2.5x2.5 (in/in) Discharge Instruction: Apply Hydrofera Blue Ready to wound bed as directed Secondary Dressing (BORDER) Zetuvit Plus SILICONE BORDER Dressing 5x5 (in/in) Discharge Instruction: Please do not put silicone bordered dressings under wraps. Use non-bordered dressing only. Secured With Compression Wrap Compression Stockings Add-Ons Electronic Signature(s) Unsigned Entered By: Angelina Pih on 08/22/2022  13:54:55 -------------------------------------------------------------------------------- Vitals Details Patient Name: Date of Service: [name], [name]. 08/22/2022 1:15 PM Medical Record Number: [id number] Patient Account Number: [id number] Date of Birth/Sex: Treating RN: 02/17/1933 (89 y.o. [name] Primary Care : Bethann Punches Other Clinician: Referring : Treating /Extender: [name] in Treatment: [address] [place] Robertson ([id number]) [id number] Page 9 of 9 Vital Signs Time Taken: 13:45 Temperature (F): 97.8 Height (in): 62 Pulse (bpm): 86 Weight (lbs): 118 Respiratory Rate (breaths/min): 18 Body Mass Index (BMI): 21.6 Blood Pressure (mmHg): 112/64 Reference Range: 80 - 120 mg / dl Electronic Signature(s) Signed: 08/22/2022 2:41:05 PM By: Midge Aver MSN RN CNS WTA Entered By: Midge Aver on 08/22/2022 14:41:05

## 2022-08-22 NOTE — Progress Notes (Addendum)
CARRYL, LASSITER ([id number]) [phone].pdf Page 1 of 7 Visit Report for 08/22/2022 Chief Complaint Document Details Patient Name: Date of Service: [facility], [name] 08/22/2022 1:15 PM Medical Record Number: [id number] Patient Account Number: [id number] Date of Birth/Sex: Treating RN: Jun 26, 1933 (89 y.o. [name] Primary Care Provider: Bethann Punches Other Clinician: Referring Provider: Treating Provider/Extender: [name] in Treatment: 13 Information Obtained from: Patient Chief Complaint Right heel pressure ulcer Electronic Signature(s) Signed: 08/22/2022 1:38:52 PM By: Allen Derry PA-C Entered By: Allen Derry on 08/22/2022 13:38:51 -------------------------------------------------------------------------------- Debridement Details Patient Name: Date of Service: [name], [name] 08/22/2022 1:15 PM Medical Record Number: [id number] Patient Account Number: [id number] Date of Birth/Sex: Treating RN: 1933/04/10 (89 y.o. [name] Primary Care Provider: Bethann Punches Other Clinician: Referring Provider: Treating Provider/Extender: [name] in Treatment: 13 Debridement Performed for Assessment: Wound #1 Right Calcaneus Performed By: Physician Allen Derry, PA-C Debridement Type: Debridement Level of Consciousness (Pre-procedure): Awake and Alert Pre-procedure Verification/Time Out Yes - 14:09 Taken: Start Time: 14:09 Percent of Wound Bed Debrided: 100% T Area Debrided (cm): otal 0.27 Tissue and other material debrided: Viable, Non-Viable, Callus, Slough, Subcutaneous, Slough Level: Skin/Subcutaneous Tissue Debridement Description: Excisional Instrument: Curette Bleeding: Minimum Hemostasis Achieved: Pressure Procedural Pain: 0 Post Procedural Pain: 0 Response to Treatment: Procedure was tolerated well Level of Consciousness [name] ([id number]) [phone].pdf Page  2 of 7 Level of Consciousness (Post- Awake and Alert procedure): Post Debridement Measurements of Total Wound Length: (cm) 0.5 Stage: Category/Stage IV Width: (cm) 0.7 Depth: (cm) 0.2 Volume: (cm) 0.055 Character of Wound/Ulcer Post Debridement: Stable Post Procedure Diagnosis Same as Pre-procedure Electronic Signature(s) Signed: 08/22/2022 4:30:30 PM By: Allen Derry PA-C Signed: 08/22/2022 4:40:48 PM By: Angelina Pih Entered By: Angelina Pih on 08/22/2022 14:10:12 -------------------------------------------------------------------------------- HPI Details Patient Name: Date of Service: [name], [name] 08/22/2022 1:15 PM Medical Record Number: [id number] Patient Account Number: [id number] Date of Birth/Sex: Treating RN: 1933-06-01 (89 y.o. [name] Primary Care Provider: Bethann Punches Other Clinician: Referring Provider: Treating Provider/Extender: [name] in Treatment: 13 History of Present Illness HPI Description: 05-22-2022 upon evaluation today patient appears to be having issues with the wound on her right heel. This actually occurred during a surgical intervention for her left knee she had a knee replacement. She went in with no issues and when she got home or even while she was staying overnight before being discharged home noted to have an issue with a discolored area on her right heel. She is unsure how this happened but knows it was not present prior to the surgery. She has been monitoring since it is now an eschar covered region that obviously is going to need to be removed in order to allow this to heal. With that being said her ABIs were not registering very well NuShield today it appears that she may have a issue with good arterial flow this could be somewhat accounting for what is going on with the heel. Nonetheless I do think she is probably going to be seen by a vein and vascular for arterial studies. She had the surgery on Friday and  came home on a Saturday. This was a very short stay. Patient does have a history of rheumatoid arthritis but really no other major medical problems. 05-29-2022 upon evaluation today patient appears to be doing well currently in regard to her wound. In fact she is telling me that she is  doing quite well at this point. Fortunately there does not appear to be any signs of active infection locally nor systemically at this time which is great news. No fevers, chills, nausea, vomiting, or diarrhea. The good news that she did get the Santyl although it took until Friday. 06-15-2022 upon evaluation today patient appears to be doing well currently in regard to her heel ulcer. We are slowly getting this to loosen up and clearway. Fortunately I do not see any signs of active infection at this time which is great news I think that we are definitely ready to clear away some of the necrotic debris today. 06-20-2022 upon evaluation today patient's wound is actually showing signs of good improvement with regard to the heel. She has been tolerating the dressing changes without complication and currently I am going to recommend that we go ahead and continue with the plan using the Santyl to help clean up the surface of this wound. Really she seems to be doing quite well with that. 06-27-2022 upon evaluation patient actually appears to be doing better she is slowly showing signs of improvement. With that being said the wound is a bit deeper but this is only because we are getting to the base of the wound as far as cleaning out the necrotic tissue is concerned. Fortunately I think the Santyl is loosening things up so we can actually do this. I think we are going to switch to using Dakin's moistened gauze over top of the Santyl as of today. 07-04-2022 upon evaluation today patient appears to be doing well currently in regard to her wound. She has been tolerating the dressing changes without complication. Fortunately I do not  see any signs of active infection at this time which is great news and in general I do believe that removing in the appropriate direction here. She is going to require some debridement today. 07-18-2022 upon evaluation today patient appears to be doing well currently in regard to her wound. In fact this is showing signs of improvement which is great news. Fortunately I do not see any evidence of active infection locally or systemically which is great news. No fevers, chills, nausea, vomiting, or diarrhea. 07-25-2022 upon evaluation today patient appears to be doing well currently in regard to her heel. She is slowly making good progress towards complete closure which is excellent news. I do not see any signs of active infection at this time which is great news as well. 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 We have been using Santyl Dakin's gauze Zetuvit and a border foam. ROSELLE, CUCINELLA ([id number]) [id number] Page 3 of 7 She is making nice progress 08-08-2022 upon evaluation today patient appears to be doing well currently in regard to her wound. She has been tolerating the dressing changes without complication. With that being said I think it may be time for [place] to go and make a switch here as far as the wound is concerned to something different. She is in agreement with this plan I think Hydrofera Blue's can be an also option at this point. 08-15-2022 upon evaluation today patient appears to be doing well currently in regard to her heel ulcer. This is actually showing signs of improvement and looks much better I am actually very pleased with where we stand today. She has been required sharp debridement to clear away the necrotic debris. 08-22-2022 upon evaluation today patient appears to be doing excellent in regard to  her heel ulcer. She has been tolerating the dressing changes without complication and in general I do  believe that we are making really good headway towards complete closure here. Electronic Signature(s) Signed: 08/22/2022 2:29:20 PM By: Allen Derry PA-C Entered By: Allen Derry on 08/22/2022 14:29:19 -------------------------------------------------------------------------------- Physical Exam Details Patient Name: Date of Service: [name], [name] 08/22/2022 1:15 PM Medical Record Number: [id number] Patient Account Number: [id number] Date of Birth/Sex: Treating RN: 09-May-1933 (89 y.o. [name] Primary Care Provider: Bethann Punches Other Clinician: Referring Provider: Treating Provider/Extender: [name] in Treatment: 13 Constitutional Well-nourished and well-hydrated in no acute distress. Respiratory normal breathing without difficulty. Psychiatric this patient is able to make decisions and demonstrates good insight into disease process. Alert and Oriented x 3. pleasant and cooperative. Notes Patient's wound did require sharp debridement clearway necrotic debris she tolerated the debridement today without complication and postdebridement the wound bed appears to be doing much better great news. In general I do believe that we are really doing excellent at this point. which is Psychologist, prison and probation services) Signed: 08/22/2022 2:29:48 PM By: Allen Derry PA-C Entered By: Allen Derry on 08/22/2022 14:29:48 -------------------------------------------------------------------------------- Physician Orders Details Patient Name: Date of Service: [name], [name] 08/22/2022 1:15 PM Medical Record Number: [id number] Patient Account Number: [id number] Date of Birth/Sex: Treating RN: 11/15/1933 (89 y.o. [name] Primary Care Provider: Bethann Punches Other Clinician: MONAYE, WEECH ([id number]) [id number] Page 4 of 7 Referring Provider: Treating Provider/Extender: [name] in Treatment: [phone] Verbal / Phone Orders:  No Diagnosis Coding ICD-10 Coding Code Description L89.610 Pressure ulcer of right heel, unstageable M05.80 Other rheumatoid arthritis with rheumatoid factor of unspecified site Follow-up Appointments Return Appointment in 1 week. Bathing/ [employer] wounds with antibacterial soap and water. Doylene Canning dial Anesthetic (Use 'Patient Medications' Section for Anesthetic Order Entry) Lidocaine applied to wound bed Wound Treatment Wound #1 - Calcaneus Wound Laterality: Right Cleanser: Byram Ancillary Kit - 15 Day Supply 1 x Per Day/30 Days Discharge Instructions: Use supplies as instructed; Kit contains: (15) Saline Bullets; (15) 3x3 Gauze; 15 pr Gloves Cleanser: Soap and Water 1 x Per Day/30 Days Discharge Instructions: Gently cleanse wound with antibacterial soap, rinse and pat dry prior to dressing wounds Prim Dressing: Hydrofera Blue Ready Transfer Foam, 2.5x2.5 (in/in) 1 x Per Day/30 Days ary Discharge Instructions: Apply Hydrofera Blue Ready to wound bed as directed Secondary Dressing: (BORDER) Zetuvit Plus SILICONE BORDER Dressing 5x5 (in/in) 1 x Per Day/30 Days Discharge Instructions: Please do not put silicone bordered dressings under wraps. Use non-bordered dressing only. Electronic Signature(s) Signed: 08/22/2022 2:42:25 PM By: Midge Aver MSN RN CNS WTA Signed: 08/22/2022 4:30:30 PM By: Allen Derry PA-C Entered By: Midge Aver on 08/22/2022 14:42:24 -------------------------------------------------------------------------------- Problem List Details Patient Name: Date of Service: [name], [name] 08/22/2022 1:15 PM Medical Record Number: [id number] Patient Account Number: [id number] Date of Birth/Sex: Treating RN: [birth year] (89 y.o. [name] Primary Care Provider: Bethann Punches Other Clinician: Referring Provider: Treating Provider/Extender: [name] in Treatment: 13 Active Problems ICD-10 Encounter Code Description Active Date  MDM Diagnosis L89.614 Pressure ulcer of right heel, stage 4 05/22/2022 No Yes M05.80 Other rheumatoid arthritis with rheumatoid factor of unspecified site 05/22/2022 No Yes KIRRILY, LASETER ([id number]) [phone].pdf Page 5 of 7 Inactive Problems Resolved Problems Electronic Signature(s) Signed: 08/22/2022 2:31:32 PM By: Allen Derry PA-C Previous Signature: 08/22/2022 1:38:43 PM Version By: Allen Derry PA-C Entered  By: Allen Derry on 08/22/2022 14:31:32 -------------------------------------------------------------------------------- Progress Note Details Patient Name: Date of Service: [name], [name] 08/22/2022 1:15 PM Medical Record Number: [id number] Patient Account Number: [id number] Date of Birth/Sex: Treating RN: Dec 19, 1933 (89 y.o. [name] Primary Care Provider: Bethann Punches Other Clinician: Referring Provider: Treating Provider/Extender: [name] in Treatment: 13 Subjective Chief Complaint Information obtained from Patient Right heel pressure ulcer History of Present Illness (HPI) 05-22-2022 upon evaluation today patient appears to be having issues with the wound on her right heel. This actually occurred during a surgical intervention for her left knee she had a knee replacement. She went in with no issues and when she got home or even while she was staying overnight before being discharged home noted to have an issue with a discolored area on her right heel. She is unsure how this happened but knows it was not present prior to the surgery. She has been monitoring since it is now an eschar covered region that obviously is going to need to be removed in order to allow this to heal. With that being said her ABIs were not registering very well NuShield today it appears that she may have a issue with good arterial flow this could be somewhat accounting for what is going on with the heel. Nonetheless I do think she is probably going to  be seen by a vein and vascular for arterial studies. She had the surgery on Friday and came home on a Saturday. This was a very short stay. Patient does have a history of rheumatoid arthritis but really no other major medical problems. 05-29-2022 upon evaluation today patient appears to be doing well currently in regard to her wound. In fact she is telling me that she is doing quite well at this point. Fortunately there does not appear to be any signs of active infection locally nor systemically at this time which is great news. No fevers, chills, nausea, vomiting, or diarrhea. The good news that she did get the Santyl although it took until Friday. 06-15-2022 upon evaluation today patient appears to be doing well currently in regard to her heel ulcer. We are slowly getting this to loosen up and clearway. Fortunately I do not see any signs of active infection at this time which is great news I think that we are definitely ready to clear away some of the necrotic debris today. 06-20-2022 upon evaluation today patient's wound is actually showing signs of good improvement with regard to the heel. She has been tolerating the dressing changes without complication and currently I am going to recommend that we go ahead and continue with the plan using the Santyl to help clean up the surface of this wound. Really she seems to be doing quite well with that. 06-27-2022 upon evaluation patient actually appears to be doing better she is slowly showing signs of improvement. With that being said the wound is a bit deeper but this is only because we are getting to the base of the wound as far as cleaning out the necrotic tissue is concerned. Fortunately I think the Santyl is loosening things up so we can actually do this. I think we are going to switch to using Dakin's moistened gauze over top of the Santyl as of today. 07-04-2022 upon evaluation today patient appears to be doing well currently in regard to her wound. She  has been tolerating the dressing changes without complication. Fortunately I do not see any signs of active  infection at this time which is great news and in general I do believe that removing in the appropriate direction here. She is going to require some debridement today. 07-18-2022 upon evaluation today patient appears to be doing well currently in regard to her wound. In fact this is showing signs of improvement which is great news. Fortunately I do not see any evidence of active infection locally or systemically which is great news. No fevers, chills, nausea, vomiting, or diarrhea. 07-25-2022 upon evaluation today patient appears to be doing well currently in regard to her heel. She is slowly making good progress towards complete closure which is excellent news. I do not see any signs of active infection at this time which is great news as well. 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 We have been using Santyl Dakin's gauze Zetuvit and a border foam. She is making nice progress ZARYA, BETANCOURTH ([id number]) [id number] Page 6 of 7 08-08-2022 upon evaluation today patient appears to be doing well currently in regard to her wound. She has been tolerating the dressing changes without complication. With that being said I think it may be time for [place] to go and make a switch here as far as the wound is concerned to something different. She is in agreement with this plan I think Hydrofera Blue's can be an also option at this point. 08-15-2022 upon evaluation today patient appears to be doing well currently in regard to her heel ulcer. This is actually showing signs of improvement and looks much better I am actually very pleased with where we stand today. She has been required sharp debridement to clear away the necrotic debris. 08-22-2022 upon evaluation today patient appears to be doing excellent in regard to her heel ulcer. She has  been tolerating the dressing changes without complication and in general I do believe that we are making really good headway towards complete closure here. Objective Constitutional Well-nourished and well-hydrated in no acute distress. Vitals Time Taken: 1:45 PM, Height: 62 in, Weight: 118 lbs, BMI: 21.6, Temperature: 97.8 F, Pulse: 86 bpm, Respiratory Rate: 18 breaths/min, Blood Pressure: 112/64 mmHg. Respiratory normal breathing without difficulty. Psychiatric this patient is able to make decisions and demonstrates good insight into disease process. Alert and Oriented x 3. pleasant and cooperative. General Notes: Patient's wound did require sharp debridement clearway necrotic debris she tolerated the debridement today without complication and postdebridement the wound bed appears to be doing much better great news. In general I do believe that we are really doing excellent at this point. which is Integumentary (Hair, Skin) Wound #1 status is Open. Original cause of wound was Pressure Injury. The date acquired was: 03/25/2022. The wound has been in treatment 13 weeks. The wound is located on the Right Calcaneus. The wound measures 0.5cm length x 0.7cm width x 0.2cm depth; 0.275cm^2 area and 0.055cm^3 volume. There is Fat Layer (Subcutaneous Tissue) exposed. There is a medium amount of serosanguineous drainage noted. There is no granulation within the wound bed. There is a large (67-100%) amount of necrotic tissue within the wound bed including Adherent Slough. Assessment Active Problems ICD-10 Pressure ulcer of right heel, stage 4 Other rheumatoid arthritis with rheumatoid factor of unspecified site Procedures Wound #1 Pre-procedure diagnosis of Wound #1 is a Pressure Ulcer located on the Right Calcaneus . There was a Excisional Skin/Subcutaneous Tissue Debridement with a total area of 0.27 sq cm performed by Allen Derry, PA-C. With the following instrument(s):  Curette to remove Viable and  Non-Viable tissue/material. Material removed includes Callus, Subcutaneous Tissue, and Slough. No specimens were taken. A time out was conducted at 14:09, prior to the start of the procedure. A Minimum amount of bleeding was controlled with Pressure. The procedure was tolerated well with a pain level of 0 throughout and a pain level of 0 following the procedure. Post Debridement Measurements: 0.5cm length x 0.7cm width x 0.2cm depth; 0.055cm^3 volume. Post debridement Stage noted as Category/Stage IV. Character of Wound/Ulcer Post Debridement is stable. Post procedure Diagnosis Wound #1: Same as Pre-Procedure Plan Follow-up Appointments: Return Appointment in 1 week. Bathing/ Shower/ Hygiene: Wash wounds with antibacterial soap and water. Doylene Canning dial Anesthetic (Use 'Patient Medications' Section for Anesthetic Order Entry): Lidocaine applied to wound bed WOUND #1: - Calcaneus Wound Laterality: Right Cleanser: Byram Ancillary Kit - 15 Day Supply 1 x Per Day/30 Days Discharge Instructions: Use supplies as instructed; Kit contains: (15) Saline Bullets; (15) 3x3 Gauze; 15 pr Gloves Cleanser: Soap and Water 1 x Per Day/30 Days HEERA, WENIG ([id number]) [id number] Page 7 of 7 Discharge Instructions: Gently cleanse wound with antibacterial soap, rinse and pat dry prior to dressing wounds Prim Dressing: Hydrofera Blue Ready Transfer Foam, 2.5x2.5 (in/in) 1 x Per Day/30 Days ary Discharge Instructions: Apply Hydrofera Blue Ready to wound bed as directed Secondary Dressing: (BORDER) Zetuvit Plus SILICONE BORDER Dressing 5x5 (in/in) 1 x Per Day/30 Days Discharge Instructions: Please do not put silicone bordered dressings under wraps. Use non-bordered dressing only. 1. I am going to recommend that we have the patient continue to monitor for any evidence of infection or worsening. Based on what I am seeing I am going to overall suggest that we have the patient continue to  utilize the [facility] which I feel like is really doing a good job here. 2. I am going to recommend a bordered foam dressing to cover. 3. I am also going to suggest that we have the patient continue with the offloading as far as the heel is concerned making sure there is no pressure to the area extensively which will again allow this to heal much more effectively and appropriately. We will see patient back for reevaluation in 1 week here in the clinic. If anything worsens or changes patient will contact our office for additional recommendations. Electronic Signature(s) Signed: 08/22/2022 2:31:55 PM By: Allen Derry PA-C Previous Signature: 08/22/2022 2:30:57 PM Version By: Allen Derry PA-C Entered By: Allen Derry on 08/22/2022 14:31:54 -------------------------------------------------------------------------------- SuperBill Details Patient Name: Date of Service: [name], [name] 08/22/2022 Medical Record Number: [id number] Patient Account Number: [id number] Date of Birth/Sex: Treating RN: 21-Nov-1933 (89 y.o. [name] Primary Care Provider: Bethann Punches Other Clinician: Referring Provider: Treating Provider/Extender: [name] in Treatment: 13 Diagnosis Coding ICD-10 Codes Code Description [phone] Pressure ulcer of right heel, stage 4 M05.80 Other rheumatoid arthritis with rheumatoid factor of unspecified site Facility Procedures : CPT4 Code: [id number] Description: 11042 - DEB SUBQ TISSUE 20 SQ CM/< ICD-10 Diagnosis Description L89.614 Pressure ulcer of right heel, stage 4 Modifier: Quantity: 1 Physician Procedures : CPT4 Code Description Modifier [id number] 11042 - WC PHYS SUBQ TISS 20 SQ CM ICD-10 Diagnosis Description L89.614 Pressure ulcer of right heel, stage 4 Quantity: 1 Electronic Signature(s) Signed: 08/22/2022 2:32:37 PM By: Allen Derry PA-C Entered By: Allen Derry on 08/22/2022 14:32:37

## 2022-08-29 ENCOUNTER — Encounter: Payer: Medicare Other | Admitting: Physician Assistant

## 2022-08-29 DIAGNOSIS — L89514 Pressure ulcer of right ankle, stage 4: Secondary | ICD-10-CM | POA: Diagnosis not present

## 2022-08-29 NOTE — Progress Notes (Addendum)
[name], [name] ([id number]) [phone].pdf Page 1 of 8 Visit Report for 08/29/2022 Arrival Information Details Patient Name: Date of Service: [name], [name] 08/29/2022 1:15 PM Medical Record Number: [id number] Patient Account Number: [id number] Date of Birth/Sex: Treating RN: 1933-11-30 (89 y.o. [name] Primary Care [name]: [name] Other Clinician: Referring [name]: Treating [name]/Extender: [name] in Treatment: 14 Visit Information History Since Last Visit Added or deleted any medications: No Patient Arrived: Ambulatory Any new allergies or adverse reactions: No Arrival Time: 13:26 Has Dressing in Place as Prescribed: Yes Accompanied By: sister in law Pain Present Now: No Transfer Assistance: None Patient Identification Verified: Yes Secondary Verification Process Completed: Yes Patient Requires Transmission-Based Precautions: No Patient Has Alerts: Yes Patient Alerts: NOT Diabetic PAD Bilateral ABI L1.08TBI .70 05/25/22 ABI R .97 TBI .70 05/25/22 Electronic Signature(s) Signed: 09/01/2022 1:24:46 PM By: Monica Aver MSN RN CNS WTA Entered By: [name] on 08/29/2022 10:26:57 -------------------------------------------------------------------------------- Clinic Level of Care Assessment Details Patient Name: Date of Service: [name], [name] 08/29/2022 1:15 PM Medical Record Number: [id number] Patient Account Number: [id number] Date of Birth/Sex: Treating RN: May 31, 1933 (89 y.o. [name] Primary Care Marton Robertson: [name] Other Clinician: Referring [name]: Treating [name] in Treatment: 14 Clinic Level of Care Assessment Items TOOL 1 Quantity Score []  - 0 Use when EandM and Procedure is performed on INITIAL visit ASSESSMENTS - Nursing Assessment / Reassessment []  - 0 General Physical Exam (combine w/ comprehensive assessment (listed just below) when  performed on new pt. evals) []  - 0 Comprehensive Assessment (HX, ROS, Risk Assessments, Wounds Hx, etc.) ASSESSMENTS - Wound and Skin Assessment / Reassessment [name], [name] ([id number]) [id number].pdf Page 2 of 8 []  - 0 Dermatologic / Skin Assessment (not related to wound area) ASSESSMENTS - Ostomy and/or Continence Assessment and Care []  - 0 Incontinence Assessment and Management []  - 0 Ostomy Care Assessment and Management (repouching, etc.) PROCESS - Coordination of Care []  - 0 Simple Patient / Family Education for ongoing care []  - 0 Complex (extensive) Patient / Family Education for ongoing care []  - 0 Staff obtains Chiropractor, Records, T Results / Process Orders est []  - 0 Staff telephones HHA, Nursing Homes / Clarify orders / etc []  - 0 Routine Transfer to another Facility (non-emergent condition) []  - 0 Routine [facility] Admission (non-emergent condition) []  - 0 New Admissions / Manufacturing engineer / Ordering NPWT Apligraf, etc. , []  - 0 Emergency [facility] Admission (emergent condition) PROCESS - Special Needs []  - 0 Pediatric / Minor Patient Management []  - 0 Isolation Patient Management []  - 0 Hearing / Language / Visual special needs []  - 0 Assessment of Community assistance (transportation, D/C planning, etc.) []  - 0 Additional assistance / Altered mentation []  - 0 Support Surface(s) Assessment (bed, cushion, seat, etc.) INTERVENTIONS - Miscellaneous []  - 0 External ear exam []  - 0 Patient Transfer (multiple staff / Nurse, adult / Similar devices) []  - 0 Simple Staple / Suture removal (25 or less) []  - 0 Complex Staple / Suture removal (26 or more) []  - 0 Hypo/Hyperglycemic Management (do not check if billed separately) []  - 0 Ankle / Brachial Index (ABI) - do not check if billed separately Has the patient been seen at the [facility] within the last three years: Yes Total Score: 0 Level Of Care: ____ Electronic  Signature(s) Signed: 09/01/2022 1:24:46 PM By: Monica Aver MSN RN CNS WTA Entered By: [name] on 08/29/2022  10:52:02 -------------------------------------------------------------------------------- Encounter Discharge Information Details Patient Name: Date of Service: [name], [name] 08/29/2022 1:15 PM Medical Record Number: [id number] Patient Account Number: [id number] Date of Birth/Sex: Treating RN: [birth year] (89 y.o. [name] Primary Care [name]: [name] Other Clinician: Referring [name]: Treating [name]/Extender: [name] in Treatment: [address], [place] ([id number]) [id number] Page 3 of 8 Encounter Discharge Information Items Post Procedure Vitals Discharge Condition: Stable Temperature (F): 97.8 Ambulatory Status: Ambulatory Pulse (bpm): 93 Discharge Destination: Home Respiratory Rate (breaths/min): 18 Transportation: Private Auto Blood Pressure (mmHg): 118/64 Accompanied By: self Schedule Follow-up Appointment: Yes Clinical Summary of Care: Electronic Signature(s) Signed: 09/01/2022 1:24:46 PM By: Monica Aver MSN RN CNS WTA Entered By: [name] on 08/29/2022 10:54:10 -------------------------------------------------------------------------------- Lower Extremity Assessment Details Patient Name: Date of Service: [name], [name] 08/29/2022 1:15 PM Medical Record Number: [id number] Patient Account Number: [id number] Date of Birth/Sex: Treating RN: 01-07-1934 (89 y.o. [name] Primary Care Dione Mccombie: [name] Other Clinician: Referring Aubry Rankin: Treating [name]/Extender: [name] in Treatment: 14 Electronic Signature(s) Signed: 09/01/2022 1:24:46 PM By: Monica Aver MSN RN CNS WTA Entered By: [name] on 08/29/2022 10:40:30 -------------------------------------------------------------------------------- Multi Wound Chart Details Patient Name: Date of  Service: [name], [name] 08/29/2022 1:15 PM Medical Record Number: [id number] Patient Account Number: [id number] Date of Birth/Sex: Treating RN: [birth year] (89 y.o. [name] Primary Care Guliana Weyandt: [name] Other Clinician: Referring Laurel Harnden: Treating Sharnae Winfree/Extender: [name] in Treatment: 14 Vital Signs Height(in): 62 Pulse(bpm): 93 Weight(lbs): 118 Blood Pressure(mmHg): 118/64 Body Mass Index(BMI): 21.6 Temperature(F): 97.8 Respiratory Rate(breaths/min): 18 [1:Photos:] [N/A:N/A] Right Calcaneus N/A N/A Wound Location: Pressure Injury N/A N/A Wounding Event: Pressure Ulcer N/A N/A Primary Etiology: Hypertension, Rheumatoid Arthritis N/A N/A Comorbid History: 03/25/2022 N/A N/A Date Acquired: 14 N/A N/A Weeks of Treatment: Open N/A N/A Wound Status: No N/A N/A Wound Recurrence: 0.5x0.6x0.2 N/A N/A Measurements L x W x D (cm) 0.236 N/A N/A A (cm) : rea 0.047 N/A N/A Volume (cm) : 88.20% N/A N/A % Reduction in A rea: 76.50% N/A N/A % Reduction in Volume: Category/Stage IV N/A N/A Classification: Medium N/A N/A Exudate A mount: Serosanguineous N/A N/A Exudate Type: red, brown N/A N/A Exudate Color: None Present (0%) N/A N/A Granulation A mount: Large (67-100%) N/A N/A Necrotic A mount: Fat Layer (Subcutaneous Tissue): Yes N/A N/A Exposed Structures: Fascia: No Tendon: No Muscle: No Joint: No Bone: No None N/A N/A Epithelialization: Treatment Notes Electronic Signature(s) Signed: 09/01/2022 1:24:46 PM By: Monica Aver MSN RN CNS WTA Entered By: [name] on 08/29/2022 10:48:23 -------------------------------------------------------------------------------- Multi-Disciplinary Care Plan Details Patient Name: Date of Service: [name], [name] 08/29/2022 1:15 PM Medical Record Number: [id number] Patient Account Number: [id number] Date of Birth/Sex: Treating RN: 17-Nov-1933 (89 y.o. [name] Primary Care  Shlonda Dolloff: [name] Other Clinician: Referring Ronnika Collett: Treating Sherilynn Dieu/Extender: [name] in Treatment: 14 Active Inactive Wound/Skin Impairment Nursing Diagnoses: Impaired tissue integrity Knowledge deficit related to ulceration/compromised skin integrity Goals: Patient/caregiver will verbalize understanding of skin care regimen Date Initiated: 05/22/2022 Date Inactivated: 06/27/2022 Target Resolution Date: 06/22/2022 Goal Status: Met [name], [name] ([id number]) [phone].pdf Page 5 of 8 Ulcer/skin breakdown will have a volume reduction of 30% by week 4 Date Initiated: 05/22/2022 Date Inactivated: 06/27/2022 Target Resolution Date: 06/22/2022 Goal Status: Met Ulcer/skin breakdown will have a volume reduction of 50% by week 8 Date Initiated: 05/22/2022 Date Inactivated: 07/18/2022 Target Resolution  Date: 07/22/2022 Goal Status: Met Ulcer/skin breakdown will have a volume reduction of 80% by week 12 Date Initiated: 05/22/2022 Date Inactivated: 08/15/2022 Target Resolution Date: 08/22/2022 Goal Status: Met Ulcer/skin breakdown will heal within 14 weeks Date Initiated: 05/22/2022 Target Resolution Date: 09/19/2022 Goal Status: Active Interventions: Assess patient/caregiver ability to obtain necessary supplies Assess patient/caregiver ability to perform ulcer/skin care regimen upon admission and as needed Assess ulceration(s) every visit Provide education on ulcer and skin care Treatment Activities: Referred to DME Maricruz Lucero for dressing supplies : 05/22/2022 Skin care regimen initiated : 05/22/2022 Topical wound management initiated : 05/22/2022 Notes: Electronic Signature(s) Signed: 09/01/2022 1:24:46 PM By: Monica Aver MSN RN CNS WTA Entered By: [name] on 08/29/2022 10:53:00 -------------------------------------------------------------------------------- Pain Assessment Details Patient Name: Date of Service: [name], [name]  08/29/2022 1:15 PM Medical Record Number: [id number] Patient Account Number: [id number] Date of Birth/Sex: Treating RN: Dec 15, 1933 (89 y.o. [name] Primary Care Lakayla Barrington: [name] Other Clinician: Referring Maria Coin: Treating Skylin Kennerson/Extender: [name] in Treatment: 14 Active Problems Location of Pain Severity and Description of Pain Patient Has Paino No Site Locations [place], [place] J ([id number]) [id number] Page 6 of 8 Pain Management and Medication Current Pain Management: Electronic Signature(s) Signed: 09/01/2022 1:24:46 PM By: Monica Aver MSN RN CNS WTA Entered By: [name] on 08/29/2022 10:32:25 -------------------------------------------------------------------------------- Patient/Caregiver Education Details Patient Name: Date of Service: [name], [name] 8/20/2024andnbsp1:15 PM Medical Record Number: [id number] Patient Account Number: [id number] Date of Birth/Gender: Treating RN: January 05, 1934 (89 y.o. [name] Primary Care Physician: [name] Other Clinician: Referring Physician: Treating Physician/Extender: [name] in Treatment: 14 Education Assessment Education Provided To: Patient Education Topics Provided Wound/Skin Impairment: Handouts: Caring for Your Ulcer Methods: Explain/Verbal Responses: State content correctly Electronic Signature(s) Signed: 09/01/2022 1:24:46 PM By: Monica Aver MSN RN CNS WTA Entered By: [name] on 08/29/2022 10:53:13 -------------------------------------------------------------------------------- Wound Assessment Details Patient Name: Date of Service: [name], [name] 08/29/2022 1:15 PM Medical Record Number: [id number] Patient Account Number: [id number] Date of Birth/Sex: Treating RN: [birth year] (89 y.o. [name] Primary Care Cyanne Delmar: [name] Other Clinician: Referring Darionna Banke: Treating Moses Odoherty/Extender: [name] in Treatment: 14 Wound Status Wound Number: 1 Primary Etiology: Pressure Ulcer [name], [name] ([id number]) [id number] Page 7 of 8 Wound Location: Right Calcaneus Wound Status: Open Wounding Event: Pressure Injury Comorbid History: Hypertension, Rheumatoid Arthritis Date Acquired: 03/25/2022 Weeks Of Treatment: 14 Clustered Wound: No Photos Wound Measurements Length: (cm) 0.5 Width: (cm) 0.6 Depth: (cm) 0.2 Area: (cm) 0.236 Volume: (cm) 0.047 % Reduction in Area: 88.2% % Reduction in Volume: 76.5% Epithelialization: None Wound Description Classification: Category/Stage IV Exudate Amount: Medium Exudate Type: Serosanguineous Exudate Color: red, brown Foul Odor After Cleansing: No Slough/Fibrino Yes Wound Bed Granulation Amount: None Present (0%) Exposed Structure Necrotic Amount: Large (67-100%) Fascia Exposed: No Necrotic Quality: Adherent Slough Fat Layer (Subcutaneous Tissue) Exposed: Yes Tendon Exposed: No Muscle Exposed: No Joint Exposed: No Bone Exposed: No Treatment Notes Wound #1 (Calcaneus) Wound Laterality: Right Cleanser Byram Ancillary Kit - 15 Day Supply Discharge Instruction: Use supplies as instructed; Kit contains: (15) Saline Bullets; (15) 3x3 Gauze; 15 pr Gloves Soap and Water Discharge Instruction: Gently cleanse wound with antibacterial soap, rinse and pat dry prior to dressing wounds Peri-Wound Care Topical Primary Dressing Hydrofera Blue Ready Transfer Foam, 2.5x2.5 (in/in) Discharge Instruction: Apply Hydrofera Blue Ready to wound bed as directed Secondary Dressing (BORDER) Zetuvit Plus SILICONE BORDER Dressing  5x5 (in/in) Discharge Instruction: Please do not put silicone bordered dressings under wraps. Use non-bordered dressing only. Secured With Compression Wrap Compression Stockings Facilities manager) Signed: 09/01/2022 1:24:46 PM By: Monica Aver MSN RN CNS [address]  [name] ([id number]) [id number] Page 8 of 8 Entered By: [name] on 08/29/2022 10:38:16 -------------------------------------------------------------------------------- Vitals Details Patient Name: Date of Service: [name], [name] 08/29/2022 1:15 PM Medical Record Number: [id number] Patient Account Number: [id number] Date of Birth/Sex: Treating RN: 1933-06-18 (89 y.o. [name] Primary Care Jonanthan Bolender: [name] Other Clinician: Referring Elior Robinette: Treating Deshante Cassell/Extender: [name] in Treatment: 14 Vital Signs Time Taken: 13:29 Temperature (F): 97.8 Height (in): 62 Pulse (bpm): 93 Weight (lbs): 118 Respiratory Rate (breaths/min): 18 Body Mass Index (BMI): 21.6 Blood Pressure (mmHg): 118/64 Reference Range: 80 - 120 mg / dl Electronic Signature(s) Signed: 09/01/2022 1:24:46 PM By: Monica Aver MSN RN CNS WTA Entered By: [name] on 08/29/2022 10:32:19

## 2022-08-29 NOTE — Progress Notes (Addendum)
SARELA, PUJOL ([id number]) [id number] Page 1 of 8 Visit Report for 08/29/2022 Chief Complaint Document Details Patient Name: Date of Service: [name], [name] J. 08/29/2022 1:15 PM Medical Record Number: [id number] Patient Account Number: [id number] Date of Birth/Sex: Treating RN: 1933/07/19 (89 y.o. [name] Primary Care Provider: Bethann Punches Other Clinician: Referring Provider: Treating Provider/Extender: [name] in Treatment: 14 Information Obtained from: Patient Chief Complaint Right heel pressure ulcer Electronic Signature(s) Signed: 08/29/2022 1:07:30 PM By: Allen Derry PA-C Entered By: Allen Derry on 08/29/2022 13:07:30 -------------------------------------------------------------------------------- Debridement Details Patient Name: Date of Service: [name], [name] J. 08/29/2022 1:15 PM Medical Record Number: [id number] Patient Account Number: [id number] Date of Birth/Sex: Treating RN: Sep 30, 1933 (89 y.o. [name] Primary Care Provider: Bethann Punches Other Clinician: Referring Provider: Treating Provider/Extender: [name] in Treatment: 14 Debridement Performed for Assessment: Wound #1 Right Calcaneus Performed By: Physician Allen Derry, PA-C Debridement Type: Debridement Level of Consciousness (Pre-procedure): Awake and Alert Pre-procedure Verification/Time Out Yes - 13:50 Taken: Start Time: 13:50 Percent of Wound Bed Debrided: 100% T Area Debrided (cm): otal 0.24 Tissue and other material debrided: Viable, Non-Viable, Slough, Subcutaneous, Slough Level: Skin/Subcutaneous Tissue Debridement Description: Excisional Instrument: Curette Bleeding: Minimum Hemostasis Achieved: Pressure Procedural Pain: 2 Post Procedural Pain: 2 Response to Treatment: Procedure was tolerated well Level of Consciousness Arlie SolomonsMCKINLEIGH, BELD ([id number]) [id number] Page 2 of  8 Level of Consciousness (Post- Awake and Alert procedure): Post Debridement Measurements of Total Wound Length: (cm) 0.5 Stage: Category/Stage IV Width: (cm) 0.6 Depth: (cm) 0.2 Volume: (cm) 0.047 Character of Wound/Ulcer Post Debridement: Stable Post Procedure Diagnosis Same as Pre-procedure Electronic Signature(s) Signed: 08/29/2022 6:45:54 PM By: Allen Derry PA-C Signed: 09/01/2022 1:24:46 PM By: Midge Aver MSN RN CNS WTA Entered By: Midge Aver on 08/29/2022 13:51:35 -------------------------------------------------------------------------------- HPI Details Patient Name: Date of Service: [name], [name] J. 08/29/2022 1:15 PM Medical Record Number: [id number] Patient Account Number: [id number] Date of Birth/Sex: Treating RN: [birth year] (89 y.o. [name] Primary Care Provider: Bethann Punches Other Clinician: Referring Provider: Treating Provider/Extender: [name] in Treatment: 14 History of Present Illness HPI Description: 05-22-2022 upon evaluation today patient appears to be having issues with the wound on her right heel. This actually occurred during a surgical intervention for her left knee she had a knee replacement. She went in with Monica issues and when she got home or even while she was staying overnight before being discharged home noted to have an issue with a discolored area on her right heel. She is unsure how this happened but knows it was not present prior to the surgery. She has been monitoring since it is now an eschar covered region that obviously is going to need to be removed in order to allow this to heal. With that being said her ABIs were not registering very well NuShield today it appears that she may have a issue with good arterial flow this could be somewhat accounting for what is going on with the heel. Nonetheless I do think she is probably going to be seen by a vein and vascular for arterial studies. She had the surgery on Friday  and came home on a Saturday. This was a very short stay. Patient does have a history of rheumatoid arthritis but really Monica other major medical problems. 05-29-2022 upon evaluation today patient appears to be doing well currently in regard to her wound. In fact she is telling me  that she is doing quite well at this point. Fortunately there does not appear to be any signs of active infection locally nor systemically at this time which is great news. Monica fevers, chills, nausea, vomiting, or diarrhea. The good news that she did get the Santyl although it took until Friday. 06-15-2022 upon evaluation today patient appears to be doing well currently in regard to her heel ulcer. We are slowly getting this to loosen up and clearway. Fortunately I do not see any signs of active infection at this time which is great news I think that we are definitely ready to clear away some of the necrotic debris today. 06-20-2022 upon evaluation today patient's wound is actually showing signs of good improvement with regard to the heel. She has been tolerating the dressing changes without complication and currently I am going to recommend that we go ahead and continue with the plan using the Santyl to help clean up the surface of this wound. Really she seems to be doing quite well with that. 06-27-2022 upon evaluation patient actually appears to be doing better she is slowly showing signs of improvement. With that being said the wound is a bit deeper but this is only because we are getting to the base of the wound as far as cleaning out the necrotic tissue is concerned. Fortunately I think the Santyl is loosening things up so we can actually do this. I think we are going to switch to using Dakin's moistened gauze over top of the Santyl as of today. 07-04-2022 upon evaluation today patient appears to be doing well currently in regard to her wound. She has been tolerating the dressing changes without complication. Fortunately I do  not see any signs of active infection at this time which is great news and in general I do believe that removing in the appropriate direction here. She is going to require some debridement today. 07-18-2022 upon evaluation today patient appears to be doing well currently in regard to her wound. In fact this is showing signs of improvement which is great news. Fortunately I do not see any evidence of active infection locally or systemically which is great news. Monica fevers, chills, nausea, vomiting, or diarrhea. 07-25-2022 upon evaluation today patient appears to be doing well currently in regard to her heel. She is slowly making good progress towards complete closure which is excellent news. I do not see any signs of active infection at this time which is great news as well. 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 We have been using Santyl Dakin's gauze Zetuvit and a border foam. JAALIYAH, LOAYZA ([id number]) [id number] Page 3 of 8 She is making nice progress 08-08-2022 upon evaluation today patient appears to be doing well currently in regard to her wound. She has been tolerating the dressing changes without complication. With that being said I think it may be time for [place] to go and make a switch here as far as the wound is concerned to something different. She is in agreement with this plan I think Hydrofera Blue's can be an also option at this point. 08-15-2022 upon evaluation today patient appears to be doing well currently in regard to her heel ulcer. This is actually showing signs of improvement and looks much better I am actually very pleased with where we stand today. She has been required sharp debridement to clear away the necrotic debris. 08-22-2022 upon evaluation today patient appears to be doing excellent  in regard to her heel ulcer. She has been tolerating the dressing changes without complication and in general I do  believe that we are making really good headway towards complete closure here. 08-29-2022 upon evaluation today patient appears to be doing well currently in regard to her wound this is measuring smaller yet again this week. I think from the right track here. Electronic Signature(s) Signed: 08/29/2022 6:13:24 PM By: Allen Derry PA-C Entered By: Allen Derry on 08/29/2022 18:13:24 -------------------------------------------------------------------------------- Physical Exam Details Patient Name: Date of Service: [name], [name] J. 08/29/2022 1:15 PM Medical Record Number: [id number] Patient Account Number: [id number] Date of Birth/Sex: Treating RN: Jun 03, 1933 (89 y.o. [name] Primary Care Provider: Bethann Punches Other Clinician: Referring Provider: Treating Provider/Extender: [name] in Treatment: 14 Constitutional Well-nourished and well-hydrated in Monica acute distress. Respiratory normal breathing without difficulty. Psychiatric this patient is able to make decisions and demonstrates good insight into disease process. Alert and Oriented x 3. pleasant and cooperative. Notes Upon inspection patient's wound bed actually did showed signs of good granulation and epithelization at this point. Fortunately I do not see any signs of active infection locally or systemically which is great news and in general I do think that we are making headway towards complete closure. Electronic Signature(s) Signed: 08/29/2022 6:13:42 PM By: Allen Derry PA-C Entered By: Allen Derry on 08/29/2022 18:13:41 -------------------------------------------------------------------------------- Physician Orders Details Patient Name: Date of Service: [name], [name] J. 08/29/2022 1:15 PM Medical Record Number: [id number] Patient Account Number: [id number] JOHNIECE, FLUKE ([id number]) [id number].pdf Page 4 of 8 Date of Birth/Sex: Treating RN: Aug 01, 1933 (89 y.o. [name] Primary Care Provider: Other Clinician: Bethann Punches Referring Provider: Treating Provider/Extender: [name] in Treatment: [id number] Verbal / Phone Orders: Monica Diagnosis Coding ICD-10 Coding Code Description L89.614 Pressure ulcer of right heel, stage 4 M05.80 Other rheumatoid arthritis with rheumatoid factor of unspecified site Follow-up Appointments Return Appointment in 1 week. Bathing/ [employer] wounds with antibacterial soap and water. Doylene Canning dial Anesthetic (Use 'Patient Medications' Section for Anesthetic Order Entry) Lidocaine applied to wound bed Wound Treatment Wound #1 - Calcaneus Wound Laterality: Right Cleanser: Byram Ancillary Kit - 15 Day Supply 1 x Per Day/30 Days Discharge Instructions: Use supplies as instructed; Kit contains: (15) Saline Bullets; (15) 3x3 Gauze; 15 pr Gloves Cleanser: Soap and Water 1 x Per Day/30 Days Discharge Instructions: Gently cleanse wound with antibacterial soap, rinse and pat dry prior to dressing wounds Prim Dressing: Hydrofera Blue Ready Transfer Foam, 2.5x2.5 (in/in) 1 x Per Day/30 Days ary Discharge Instructions: Apply Hydrofera Blue Ready to wound bed as directed Secondary Dressing: (BORDER) Zetuvit Plus SILICONE BORDER Dressing 5x5 (in/in) 1 x Per Day/30 Days Discharge Instructions: Please do not put silicone bordered dressings under wraps. Use non-bordered dressing only. Electronic Signature(s) Signed: 08/29/2022 6:45:54 PM By: Allen Derry PA-C Signed: 09/01/2022 1:24:46 PM By: Midge Aver MSN RN CNS WTA Entered By: Midge Aver on 08/29/2022 13:51:55 -------------------------------------------------------------------------------- Problem List Details Patient Name: Date of Service: [name], [name] J. 08/29/2022 1:15 PM Medical Record Number: [id number] Patient Account Number: [id number] Date of Birth/Sex: Treating RN: 1933-02-08 (89 y.o. [name] Primary Care Provider: Bethann Punches Other  Clinician: Referring Provider: Treating Provider/Extender: [name] in Treatment: 14 Active Problems ICD-10 Encounter Code Description Active Date MDM Diagnosis L89.614 Pressure ulcer of right heel, stage 4 05/22/2022 Monica Yes KAZI, MALIA ([id number]) [id number] Page 5 of 8  M05.80 Other rheumatoid arthritis with rheumatoid factor of unspecified site 05/22/2022 Monica Yes Inactive Problems Resolved Problems Electronic Signature(s) Signed: 08/29/2022 6:45:54 PM By: Allen Derry PA-C Signed: 09/01/2022 1:24:46 PM By: Midge Aver MSN RN CNS WTA Previous Signature: 08/29/2022 1:07:24 PM Version By: Allen Derry PA-C Entered By: Midge Aver on 08/29/2022 13:53:25 -------------------------------------------------------------------------------- Progress Note Details Patient Name: Date of Service: [name], [name] J. 08/29/2022 1:15 PM Medical Record Number: [id number] Patient Account Number: [id number] Date of Birth/Sex: Treating RN: [birth year] (89 y.o. [name] Primary Care Provider: Bethann Punches Other Clinician: Referring Provider: Treating Provider/Extender: [name] in Treatment: 14 Subjective Chief Complaint Information obtained from Patient Right heel pressure ulcer History of Present Illness (HPI) 05-22-2022 upon evaluation today patient appears to be having issues with the wound on her right heel. This actually occurred during a surgical intervention for her left knee she had a knee replacement. She went in with Monica issues and when she got home or even while she was staying overnight before being discharged home noted to have an issue with a discolored area on her right heel. She is unsure how this happened but knows it was not present prior to the surgery. She has been monitoring since it is now an eschar covered region that obviously is going to need to be removed in order to allow this to heal. With that being  said her ABIs were not registering very well NuShield today it appears that she may have a issue with good arterial flow this could be somewhat accounting for what is going on with the heel. Nonetheless I do think she is probably going to be seen by a vein and vascular for arterial studies. She had the surgery on Friday and came home on a Saturday. This was a very short stay. Patient does have a history of rheumatoid arthritis but really Monica other major medical problems. 05-29-2022 upon evaluation today patient appears to be doing well currently in regard to her wound. In fact she is telling me that she is doing quite well at this point. Fortunately there does not appear to be any signs of active infection locally nor systemically at this time which is great news. Monica fevers, chills, nausea, vomiting, or diarrhea. The good news that she did get the Santyl although it took until Friday. 06-15-2022 upon evaluation today patient appears to be doing well currently in regard to her heel ulcer. We are slowly getting this to loosen up and clearway. Fortunately I do not see any signs of active infection at this time which is great news I think that we are definitely ready to clear away some of the necrotic debris today. 06-20-2022 upon evaluation today patient's wound is actually showing signs of good improvement with regard to the heel. She has been tolerating the dressing changes without complication and currently I am going to recommend that we go ahead and continue with the plan using the Santyl to help clean up the surface of this wound. Really she seems to be doing quite well with that. 06-27-2022 upon evaluation patient actually appears to be doing better she is slowly showing signs of improvement. With that being said the wound is a bit deeper but this is only because we are getting to the base of the wound as far as cleaning out the necrotic tissue is concerned. Fortunately I think the Santyl is loosening  things up so we can actually do this. I think we are going  to switch to using Dakin's moistened gauze over top of the Santyl as of today. 07-04-2022 upon evaluation today patient appears to be doing well currently in regard to her wound. She has been tolerating the dressing changes without complication. Fortunately I do not see any signs of active infection at this time which is great news and in general I do believe that removing in the appropriate direction here. She is going to require some debridement today. 07-18-2022 upon evaluation today patient appears to be doing well currently in regard to her wound. In fact this is showing signs of improvement which is great news. Fortunately I do not see any evidence of active infection locally or systemically which is great news. Monica fevers, chills, nausea, vomiting, or diarrhea. 07-25-2022 upon evaluation today patient appears to be doing well currently in regard to her heel. She is slowly making good progress towards complete closure which is excellent news. I do not see any signs of active infection at this time which is great news as well. 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 We have been using Santyl Dakin's gauze Zetuvit and a border foam. She is making nice progress 08-08-2022 upon evaluation today patient appears to be doing well currently in regard to her wound. She has been tolerating the dressing changes without complication. With that being said I think it may be time for [place] to go and make a switch here as far as the wound is concerned to something different. She is in agreement with this plan I think Hydrofera Blue's can be an also option at this point. 08-15-2022 upon evaluation today patient appears to be doing well currently in regard to her heel ulcer. This is actually showing signs of improvement and looks much  better I am actually very pleased with where we stand today. She has been required sharp debridement to clear away the necrotic debris. 08-22-2022 upon evaluation today patient appears to be doing excellent in regard to her heel ulcer. She has been tolerating the dressing changes without complication and in general I do believe that we are making really good headway towards complete closure here. 08-29-2022 upon evaluation today patient appears to be doing well currently in regard to her wound this is measuring smaller yet again this week. I think from the right track here. Objective Constitutional Well-nourished and well-hydrated in Monica acute distress. Vitals Time Taken: 1:29 PM, Height: 62 in, Weight: 118 lbs, BMI: 21.6, Temperature: 97.8 F, Pulse: 93 bpm, Respiratory Rate: 18 breaths/min, Blood Pressure: 118/64 mmHg. Respiratory normal breathing without difficulty. Psychiatric this patient is able to make decisions and demonstrates good insight into disease process. Alert and Oriented x 3. pleasant and cooperative. General Notes: Upon inspection patient's wound bed actually did showed signs of good granulation and epithelization at this point. Fortunately I do not see any signs of active infection locally or systemically which is great news and in general I do think that we are making headway towards complete closure. Integumentary (Hair, Skin) Wound #1 status is Open. Original cause of wound was Pressure Injury. The date acquired was: 03/25/2022. The wound has been in treatment 14 weeks. The wound is located on the Right Calcaneus. The wound measures 0.5cm length x 0.6cm width x 0.2cm depth; 0.236cm^2 area and 0.047cm^3 volume. There is Fat Layer (Subcutaneous Tissue) exposed. There is a medium amount of serosanguineous drainage noted. There is Monica  granulation within the wound bed. There is a large (67-100%) amount of necrotic tissue within the wound bed including Adherent  Slough. Assessment Active Problems ICD-10 Pressure ulcer of right heel, stage 4 Other rheumatoid arthritis with rheumatoid factor of unspecified site Procedures Wound #1 Pre-procedure diagnosis of Wound #1 is a Pressure Ulcer located on the Right Calcaneus . There was a Excisional Skin/Subcutaneous Tissue Debridement with a total area of 0.24 sq cm performed by Allen Derry, PA-C. With the following instrument(s): Curette to remove Viable and Non-Viable tissue/material. Material removed includes Subcutaneous Tissue and Slough and. Monica specimens were taken. A time out was conducted at 13:50, prior to the start of the procedure. A Minimum amount of bleeding was controlled with Pressure. The procedure was tolerated well with a pain level of 2 throughout and a pain level of 2 following the procedure. Post Debridement Measurements: 0.5cm length x 0.6cm width x 0.2cm depth; 0.047cm^3 volume. Post debridement Stage noted as Category/Stage IV. Character of Wound/Ulcer Post Debridement is stable. Post procedure Diagnosis Wound #1: Same as Pre-Procedure Plan Follow-up Appointments: Return Appointment in 1 week. SHATOYA, FINK ([id number]) [id number] Page 7 of 8 Bathing/ Shower/ Hygiene: Wash wounds with antibacterial soap and water. Doylene Canning dial Anesthetic (Use 'Patient Medications' Section for Anesthetic Order Entry): Lidocaine applied to wound bed WOUND #1: - Calcaneus Wound Laterality: Right Cleanser: Byram Ancillary Kit - 15 Day Supply 1 x Per Day/30 Days Discharge Instructions: Use supplies as instructed; Kit contains: (15) Saline Bullets; (15) 3x3 Gauze; 15 pr Gloves Cleanser: Soap and Water 1 x Per Day/30 Days Discharge Instructions: Gently cleanse wound with antibacterial soap, rinse and pat dry prior to dressing wounds Prim Dressing: Hydrofera Blue Ready Transfer Foam, 2.5x2.5 (in/in) 1 x Per Day/30 Days ary Discharge Instructions: Apply Hydrofera Blue Ready to  wound bed as directed Secondary Dressing: (BORDER) Zetuvit Plus SILICONE BORDER Dressing 5x5 (in/in) 1 x Per Day/30 Days Discharge Instructions: Please do not put silicone bordered dressings under wraps. Use non-bordered dressing only. 1. I am going to recommend that we have the patient continue to monitor for any signs of infection or worsening. She will continue with the [place] Robertson which I think is doing well. 2. I am also going to recommend we continue with the Zetuvit to cover. We will see patient back for reevaluation in 2 weeks here in the clinic. If anything worsens or changes patient will contact our office for additional recommendations. This was per patient request. Electronic Signature(s) Signed: 08/29/2022 6:14:05 PM By: Allen Derry PA-C Entered By: Allen Derry on 08/29/2022 18:14:04 -------------------------------------------------------------------------------- SuperBill Details Patient Name: Date of Service: [name], [name] J. 08/29/2022 Medical Record Number: [id number] Patient Account Number: [id number] Date of Birth/Sex: Treating RN: 12/24/1933 (89 y.o. [name] Primary Care Provider: Bethann Punches Other Clinician: Referring Provider: Treating Provider/Extender: [name] in Treatment: 14 Diagnosis Coding ICD-10 Codes Code Description [id number] Pressure ulcer of right heel, stage 4 M05.80 Other rheumatoid arthritis with rheumatoid factor of unspecified site Facility Procedures : CPT4 Code: [id number] Description: 11042 - DEB SUBQ TISSUE 20 SQ CM/< ICD-10 Diagnosis Description L89.614 Pressure ulcer of right heel, stage 4 Modifier: Quantity: 1 Physician Procedures : CPT4 Code Description Modifier [id number] 11042 - WC PHYS SUBQ TISS 20 SQ CM ICD-10 Diagnosis Description L89.614 Pressure ulcer of right heel, stage 4 Quantity: 1 Electronic Signature(s) Signed: 08/29/2022 6:14:36 PM By: Wendall Papa, Lewis Moccasin ([id number])  [id number] Page 8 of 8 Entered By: Larina Bras,  Leonard Schwartz on 08/29/2022 18:14:36

## 2022-09-05 ENCOUNTER — Encounter: Payer: Medicare Other | Admitting: Internal Medicine

## 2022-09-12 ENCOUNTER — Ambulatory Visit: Payer: Medicare Other | Admitting: Physician Assistant

## 2022-09-19 ENCOUNTER — Encounter: Payer: Medicare Other | Attending: Physician Assistant | Admitting: Physician Assistant

## 2022-09-19 DIAGNOSIS — L89614 Pressure ulcer of right heel, stage 4: Secondary | ICD-10-CM | POA: Insufficient documentation

## 2022-09-19 DIAGNOSIS — I10 Essential (primary) hypertension: Secondary | ICD-10-CM | POA: Insufficient documentation

## 2022-09-19 DIAGNOSIS — M0580 Other rheumatoid arthritis with rheumatoid factor of unspecified site: Secondary | ICD-10-CM | POA: Diagnosis not present

## 2022-09-19 NOTE — Progress Notes (Signed)
JETAUN, STIRN ([id number]) [id number] Page 1 of 6 Visit Report for 09/19/2022 Chief Complaint Document Details Patient Name: Date of Service: [facility], [name] 09/19/2022 1:15 PM Medical Record Number: [id number] Patient Account Number: [id number] Date of Birth/Sex: Treating RN: 11-16-1933 (89 y.o. [name] Primary Care Provider: Bethann Punches Other Clinician: Referring Provider: Treating Provider/Extender: [name] in Treatment: 17 Information Obtained from: Patient Chief Complaint Right heel pressure ulcer Electronic Signature(s) Signed: 09/19/2022 1:28:36 PM By: Allen Derry PA-C Entered By: Allen Derry on 09/19/2022 13:28:36 -------------------------------------------------------------------------------- HPI Details Patient Name: Date of Service: [name], [name] 09/19/2022 1:15 PM Medical Record Number: [id number] Patient Account Number: [id number] Date of Birth/Sex: Treating RN: 10/28/1933 (89 y.o. [name] Primary Care Provider: Bethann Punches Other Clinician: Referring Provider: Treating Provider/Extender: [name] in Treatment: 17 History of Present Illness HPI Description: 05-22-2022 upon evaluation today patient appears to be having issues with the wound on her right heel. This actually occurred during a surgical intervention for her left knee she had a knee replacement. She went in with no issues and when she got home or even while she was staying overnight before being discharged home noted to have an issue with a discolored area on her right heel. She is unsure how this happened but knows it was not present prior to the surgery. She has been monitoring since it is now an eschar covered region that obviously is going to need to be removed in order to allow this to heal. With that being said her ABIs were not registering very well NuShield today it appears that she may have a issue with good  arterial flow this could be somewhat accounting for what is going on with the heel. Nonetheless I do think she is probably going to be seen by a vein and vascular for arterial studies. She had the surgery on Friday and came home on a Saturday. This was a very short stay. Patient does have a history of rheumatoid arthritis but really no other major medical problems. 05-29-2022 upon evaluation today patient appears to be doing well currently in regard to her wound. In fact she is telling me that she is doing quite well at this point. Fortunately there does not appear to be any signs of active infection locally nor systemically at this time which is great news. No fevers, chills, nausea, vomiting, or diarrhea. The good news that she did get the Santyl although it took until Friday. 06-15-2022 upon evaluation today patient appears to be doing well currently in regard to her heel ulcer. We are slowly getting this to loosen up and clearway. Fortunately I do not see any signs of active infection at this time which is great news I think that we are definitely ready to clear away some of the necrotic debris today. RENEE, INTRIAGO ([id number]) [id number] Page 2 of 6 06-20-2022 upon evaluation today patient's wound is actually showing signs of good improvement with regard to the heel. She has been tolerating the dressing changes without complication and currently I am going to recommend that we go ahead and continue with the plan using the Santyl to help clean up the surface of this wound. Really she seems to be doing quite well with that. 06-27-2022 upon evaluation patient actually appears to be doing better she is slowly showing signs of improvement. With that being said the wound is a bit deeper but this is only because we are  getting to the base of the wound as far as cleaning out the necrotic tissue is concerned. Fortunately I think the Santyl is loosening things up so we can actually  do this. I think we are going to switch to using Dakin's moistened gauze over top of the Santyl as of today. 07-04-2022 upon evaluation today patient appears to be doing well currently in regard to her wound. She has been tolerating the dressing changes without complication. Fortunately I do not see any signs of active infection at this time which is great news and in general I do believe that removing in the appropriate direction here. She is going to require some debridement today. 07-18-2022 upon evaluation today patient appears to be doing well currently in regard to her wound. In fact this is showing signs of improvement which is great news. Fortunately I do not see any evidence of active infection locally or systemically which is great news. No fevers, chills, nausea, vomiting, or diarrhea. 07-25-2022 upon evaluation today patient appears to be doing well currently in regard to her heel. She is slowly making good progress towards complete closure which is excellent news. I do not see any signs of active infection at this time which is great news as well. 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 We have been using Santyl Dakin's gauze Zetuvit and a border foam. She is making nice progress 08-08-2022 upon evaluation today patient appears to be doing well currently in regard to her wound. She has been tolerating the dressing changes without complication. With that being said I think it may be time for [place] to go and make a switch here as far as the wound is concerned to something different. She is in agreement with this plan I think Hydrofera Blue's can be an also option at this point. 08-15-2022 upon evaluation today patient appears to be doing well currently in regard to her heel ulcer. This is actually showing signs of improvement and looks much better I am actually very pleased with where we stand today. She has been required sharp debridement to clear  away the necrotic debris. 08-22-2022 upon evaluation today patient appears to be doing excellent in regard to her heel ulcer. She has been tolerating the dressing changes without complication and in general I do believe that we are making really good headway towards complete closure here. 08-29-2022 upon evaluation today patient appears to be doing well currently in regard to her wound this is measuring smaller yet again this week. I think from the right track here. 09-19-2022 upon evaluation today patient actually appears to be doing pretty well its actually been since 20 August since I last saw her. She actually had a fall last week where she fell being on antibiotic into her bathtub and was there for about a day and a half she tells me. I am shocked that she is not anything but just a little bit sore this could have been much more significant. With that being said there does not appear to be any signs of active infection at this time. Electronic Signature(s) Signed: 09/19/2022 2:10:46 PM By: Allen Derry PA-C Entered By: Allen Derry on 09/19/2022 14:10:45 -------------------------------------------------------------------------------- Physical Exam Details Patient Name: Date of Service: [name], [name] 09/19/2022 1:15 PM Medical Record Number: [id number] Patient Account Number: [id number] Date of Birth/Sex: Treating RN: 1933-08-16 (89 y.o. [name] Primary Care Provider: Bethann Punches Other Clinician: Referring Provider: Treating Provider/Extender: Larina Bras,  Fleet Contras, Mark Weeks in Treatment: 17 Constitutional Well-nourished and well-hydrated in no acute distress. Respiratory normal breathing without difficulty. Psychiatric this patient is able to make decisions and demonstrates good insight into disease process. Alert and Oriented x 3. pleasant and cooperative. Notes Upon inspection patient's wound actually was very dry and again I did use a curette to remove this but there was  nothing underlying the area. This was essentially just some callus buildup over top where the wound was and the wound was completely healed this actually appears to be doing excellent at this point. Electronic Signature(s) MEILI, RIGGAN ([id number]) [id number] Page 3 of 6 Signed: 09/19/2022 2:11:06 PM By: Allen Derry PA-C Entered By: Allen Derry on 09/19/2022 14:11:06 -------------------------------------------------------------------------------- Physician Orders Details Patient Name: Date of Service: [name], [name] 09/19/2022 1:15 PM Medical Record Number: [id number] Patient Account Number: [id number] Date of Birth/Sex: Treating RN: [birth year] (89 y.o. [name] Primary Care Provider: Bethann Punches Other Clinician: Referring Provider: Treating Provider/Extender: [name] in Treatment: [phone] Verbal / Phone Orders: No Diagnosis Coding ICD-10 Coding Code Description L89.614 Pressure ulcer of right heel, stage 4 M05.80 Other rheumatoid arthritis with rheumatoid factor of unspecified site Discharge From [facility] Services Discharge from Wound Care Center Treatment Complete Follow-up Appointments Other: - Call if needed Bathing/ Shower/ Hygiene Wash wounds with antibacterial soap and water. Doylene Canning dial Anesthetic (Use 'Patient Medications' Section for Anesthetic Order Entry) Lidocaine applied to wound bed Electronic Signature(s) Unsigned Previous Signature: 09/19/2022 1:52:13 PM Version By: Midge Aver MSN RN CNS WTA Entered By: Midge Aver on 09/19/2022 14:04:38 -------------------------------------------------------------------------------- Problem List Details Patient Name: Date of Service: [name], [name] 09/19/2022 1:15 PM Medical Record Number: [id number] Patient Account Number: [id number] Date of Birth/Sex: Treating RN: Jan 11, 1933 (89 y.o. [name] Primary Care Provider: Bethann Punches Other Clinician: Referring  Provider: Treating Provider/Extender: [name] in Treatment: [address], [place] J ([id number]) [id number] Page 4 of 6 Active Problems ICD-10 Encounter Code Description Active Date MDM Diagnosis L89.614 Pressure ulcer of right heel, stage 4 05/22/2022 No Yes M05.80 Other rheumatoid arthritis with rheumatoid factor of unspecified site 05/22/2022 No Yes Inactive Problems Resolved Problems Electronic Signature(s) Signed: 09/19/2022 1:28:26 PM By: Allen Derry PA-C Entered By: Allen Derry on 09/19/2022 13:28:26 -------------------------------------------------------------------------------- Progress Note Details Patient Name: Date of Service: [name], [name] 09/19/2022 1:15 PM Medical Record Number: [id number] Patient Account Number: [id number] Date of Birth/Sex: Treating RN: 12-02-1933 (89 y.o. [name] Primary Care Provider: Bethann Punches Other Clinician: Referring Provider: Treating Provider/Extender: [name] in Treatment: 17 Subjective Chief Complaint Information obtained from Patient Right heel pressure ulcer History of Present Illness (HPI) 05-22-2022 upon evaluation today patient appears to be having issues with the wound on her right heel. This actually occurred during a surgical intervention for her left knee she had a knee replacement. She went in with no issues and when she got home or even while she was staying overnight before being discharged home noted to have an issue with a discolored area on her right heel. She is unsure how this happened but knows it was not present prior to the surgery. She has been monitoring since it is now an eschar covered region that obviously is going to need to be removed in order to allow this to heal. With that being said her ABIs were not registering very well NuShield today it appears that she may have a  issue with good arterial flow this could be somewhat accounting  for what is going on with the heel. Nonetheless I do think she is probably going to be seen by a vein and vascular for arterial studies. She had the surgery on Friday and came home on a Saturday. This was a very short stay. Patient does have a history of rheumatoid arthritis but really no other major medical problems. 05-29-2022 upon evaluation today patient appears to be doing well currently in regard to her wound. In fact she is telling me that she is doing quite well at this point. Fortunately there does not appear to be any signs of active infection locally nor systemically at this time which is great news. No fevers, chills, nausea, vomiting, or diarrhea. The good news that she did get the Santyl although it took until Friday. 06-15-2022 upon evaluation today patient appears to be doing well currently in regard to her heel ulcer. We are slowly getting this to loosen up and clearway. Fortunately I do not see any signs of active infection at this time which is great news I think that we are definitely ready to clear away some of the necrotic debris today. 06-20-2022 upon evaluation today patient's wound is actually showing signs of good improvement with regard to the heel. She has been tolerating the dressing changes without complication and currently I am going to recommend that we go ahead and continue with the plan using the Santyl to help clean up the surface of this wound. Really she seems to be doing quite well with that. 06-27-2022 upon evaluation patient actually appears to be doing better she is slowly showing signs of improvement. With that being said the wound is a bit deeper but this is only because we are getting to the base of the wound as far as cleaning out the necrotic tissue is concerned. Fortunately I think the Santyl is loosening things up so we can actually do this. I think we are going to switch to using Dakin's moistened gauze over top of the Santyl as of today. JIZEL, FERONE  ([id number]) [id number] Page 5 of 6 07-04-2022 upon evaluation today patient appears to be doing well currently in regard to her wound. She has been tolerating the dressing changes without complication. Fortunately I do not see any signs of active infection at this time which is great news and in general I do believe that removing in the appropriate direction here. She is going to require some debridement today. 07-18-2022 upon evaluation today patient appears to be doing well currently in regard to her wound. In fact this is showing signs of improvement which is great news. Fortunately I do not see any evidence of active infection locally or systemically which is great news. No fevers, chills, nausea, vomiting, or diarrhea. 07-25-2022 upon evaluation today patient appears to be doing well currently in regard to her heel. She is slowly making good progress towards complete closure which is excellent news. I do not see any signs of active infection at this time which is great news as well. 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 We have been using Santyl Dakin's gauze Zetuvit and a border foam. She is making nice progress 08-08-2022 upon evaluation today patient appears to be doing well currently in regard to her wound. She has been tolerating the dressing changes without complication. With that being said I think it may be time for [place] to go and  make a switch here as far as the wound is concerned to something different. She is in agreement with this plan I think Hydrofera Blue's can be an also option at this point. 08-15-2022 upon evaluation today patient appears to be doing well currently in regard to her heel ulcer. This is actually showing signs of improvement and looks much better I am actually very pleased with where we stand today. She has been required sharp debridement to clear away the necrotic debris. 08-22-2022 upon  evaluation today patient appears to be doing excellent in regard to her heel ulcer. She has been tolerating the dressing changes without complication and in general I do believe that we are making really good headway towards complete closure here. 08-29-2022 upon evaluation today patient appears to be doing well currently in regard to her wound this is measuring smaller yet again this week. I think from the right track here. 09-19-2022 upon evaluation today patient actually appears to be doing pretty well its actually been since 20 August since I last saw her. She actually had a fall last week where she fell being on antibiotic into her bathtub and was there for about a day and a half she tells me. I am shocked that she is not anything but just a little bit sore this could have been much more significant. With that being said there does not appear to be any signs of active infection at this time. Objective Constitutional Well-nourished and well-hydrated in no acute distress. Vitals Time Taken: 1:15 PM, Height: 62 in, Weight: 118 lbs, BMI: 21.6, Temperature: 97.7 F, Pulse: 90 bpm, Respiratory Rate: 18 breaths/min, Blood Pressure: 128/72 mmHg. Respiratory normal breathing without difficulty. Psychiatric this patient is able to make decisions and demonstrates good insight into disease process. Alert and Oriented x 3. pleasant and cooperative. General Notes: Upon inspection patient's wound actually was very dry and again I did use a curette to remove this but there was nothing underlying the area. This was essentially just some callus buildup over top where the wound was and the wound was completely healed this actually appears to be doing excellent at this point. Integumentary (Hair, Skin) Wound #1 status is Healed - Epithelialized. Original cause of wound was Pressure Injury. The date acquired was: 03/25/2022. The wound has been in treatment 17 weeks. The wound is located on the Right Calcaneus. The  wound measures 0cm length x 0cm width x 0cm depth; 0cm^2 area and 0cm^3 volume. There is Fat Layer (Subcutaneous Tissue) exposed. There is a medium amount of serosanguineous drainage noted. There is no granulation within the wound bed. There is a large (67-100%) amount of necrotic tissue within the wound bed. Assessment Active Problems ICD-10 Pressure ulcer of right heel, stage 4 Other rheumatoid arthritis with rheumatoid factor of unspecified site Plan Discharge From [facility] Services: Discharge from Wound Care Center Treatment Complete Follow-up Appointments: Other: - Call if needed Bathing/ Shower/ Hygiene: Wash wounds with antibacterial soap and water. [address] dial PHEBA, TAKADA ([id number]) [id number] Page 6 of 6 Anesthetic (Use 'Patient Medications' Section for Anesthetic Order Entry): Lidocaine applied to wound bed 1. I am good recommend that we have the patient continue to monitor for any signs of infection or worsening. Based on what I am seeing I do believe that she is completely healed which is great news. 2. I am going to recommend as well that the patient should continue to monitor for any evidence of infection or worsening. Based on what I am  seeing I do believe that she is doing extremely well and to be honest I think this is healed I think she is ready for discharge. 3. I would recommend a bandage to cover just a Band-Aid basically to keep anything from rubbing over the area over the next 2 weeks. Subsequently I do believe that outside of that she is also going on need to use lotion on the heel at nighttime so should wear the bandage during the day lotion at night and that hopefully should help keep the tissue toughening up and moving in the right direction obviously she looks to be doing extremely well at this point. Will see back for follow-up visit as needed. Electronic Signature(s) Signed: 09/19/2022 2:12:52 PM By: Allen Derry PA-C Entered By:  Allen Derry on 09/19/2022 14:12:51 -------------------------------------------------------------------------------- SuperBill Details Patient Name: Date of Service: [name], [name] 09/19/2022 Medical Record Number: [id number] Patient Account Number: [id number] Date of Birth/Sex: Treating RN: [birth year] (89 y.o. [name] Primary Care Provider: Bethann Punches Other Clinician: Referring Provider: Treating Provider/Extender: [name] in Treatment: 17 Diagnosis Coding ICD-10 Codes Code Description [phone] Pressure ulcer of right heel, stage 4 M05.80 Other rheumatoid arthritis with rheumatoid factor of unspecified site Facility Procedures : CPT4 Code: [id number] Description: [id number] - WOUND CARE VISIT-LEV 2 EST PT Modifier: Quantity: 1 Physician Procedures : CPT4 Code Description Modifier [id number] - WC PHYS LEVEL 3 - EST PT ICD-10 Diagnosis Description L89.614 Pressure ulcer of right heel, stage 4 M05.80 Other rheumatoid arthritis with rheumatoid factor of unspecified site Quantity: 1 Electronic Signature(s) Signed: 09/19/2022 2:16:15 PM By: Allen Derry PA-C Entered By: Allen Derry on 09/19/2022 14:16:14

## 2022-09-19 NOTE — Progress Notes (Signed)
[name], [name] ([id number]) [id number] Page 1 of 8 Visit Report for 09/19/2022 Arrival Information Details Patient Name: Date of Service: [name]. 09/19/2022 1:15 PM Medical Record Number: [id number] Patient Account Number: [id number] Date of Birth/Sex: Treating RN: Aug 23, 1933 (89 y.o. [name] Primary Care [name]: [name] Other Clinician: Referring [name]: Treating [name]/Extender: [name] in Treatment: 17 Visit Information History Since Last Visit Added or deleted any medications: Monica Patient Arrived: Ambulatory Any new allergies or adverse reactions: Monica Arrival Time: 13:13 Has Dressing in Place as Prescribed: Yes Accompanied By: self Pain Present Now: Monica Transfer Assistance: None Patient Identification Verified: Yes Secondary Verification Process Completed: Yes Patient Requires Transmission-Based Precautions: Monica Patient Has Alerts: Yes Patient Alerts: NOT Diabetic PAD Bilateral ABI L1.08TBI .70 05/25/22 ABI R .97 TBI .70 05/25/22 Electronic Signature(s) Signed: 09/19/2022 1:51:06 PM By: Monica Aver MSN RN CNS WTA Entered By: [name] on 09/19/2022 10:51:06 -------------------------------------------------------------------------------- Clinic Level of Care Assessment Details Patient Name: Date of Service: [name]. 09/19/2022 1:15 PM Medical Record Number: [id number] Patient Account Number: [id number] Date of Birth/Sex: Treating RN: 08-Jun-1933 (89 y.o. [name] Primary Care [name]: [name] Other Clinician: Referring [name]: Treating [name]/Extender: [name] in Treatment: 17 Clinic Level of Care Assessment Items TOOL 4 Quantity Score X- 1 0 Use when only an EandM is performed on FOLLOW-UP visit ASSESSMENTS - Nursing Assessment / Reassessment X- 1 10 Reassessment of Co-morbidities (includes updates in patient status) X- 1 5 Reassessment of  Adherence to Treatment Plan ASSESSMENTS - Wound and Skin Assessment / Reassessment [name], [name] ([id number]) [id number] Page 2 of 8 X- 1 5 Simple Wound Assessment / Reassessment - one wound []  - 0 Complex Wound Assessment / Reassessment - multiple wounds []  - 0 Dermatologic / Skin Assessment (not related to wound area) ASSESSMENTS - Focused Assessment []  - 0 Circumferential Edema Measurements - multi extremities []  - 0 Nutritional Assessment / Counseling / Intervention []  - 0 Lower Extremity Assessment (monofilament, tuning fork, pulses) []  - 0 Peripheral Arterial Disease Assessment (using hand held doppler) ASSESSMENTS - Ostomy and/or Continence Assessment and Care []  - 0 Incontinence Assessment and Management []  - 0 Ostomy Care Assessment and Management (repouching, etc.) PROCESS - Coordination of Care X - Simple Patient / Family Education for ongoing care 1 15 []  - 0 Complex (extensive) Patient / Family Education for ongoing care X- 1 10 Staff obtains Chiropractor, Records, T Results / Process Orders est []  - 0 Staff telephones HHA, Nursing Homes / Clarify orders / etc []  - 0 Routine Transfer to another Facility (non-emergent condition) []  - 0 Routine Hospital Admission (non-emergent condition) []  - 0 New Admissions / Manufacturing engineer / Ordering NPWT Apligraf, etc. , []  - 0 Emergency Hospital Admission (emergent condition) X- 1 10 Simple Discharge Coordination []  - 0 Complex (extensive) Discharge Coordination PROCESS - Special Needs []  - 0 Pediatric / Minor Patient Management []  - 0 Isolation Patient Management []  - 0 Hearing / Language / Visual special needs []  - 0 Assessment of Community assistance (transportation, D/C planning, etc.) []  - 0 Additional assistance / Altered mentation []  - 0 Support Surface(s) Assessment (bed, cushion, seat, etc.) INTERVENTIONS - Wound Cleansing / Measurement X - Simple Wound Cleansing -  one wound 1 5 []  - 0 Complex Wound Cleansing - multiple wounds X- 1 5 Wound Imaging (photographs - any number of wounds) []  - 0 Wound Tracing (instead of photographs)  X- 1 5 Simple Wound Measurement - one wound []  - 0 Complex Wound Measurement - multiple wounds INTERVENTIONS - Wound Dressings []  - 0 Small Wound Dressing one or multiple wounds []  - 0 Medium Wound Dressing one or multiple wounds []  - 0 Large Wound Dressing one or multiple wounds []  - 0 Application of Medications - topical []  - 0 Application of Medications - injection INTERVENTIONS - Miscellaneous []  - 0 External ear exam []  - 0 Specimen Collection (cultures, biopsies, blood, body fluids, etc.) [name], [name] ([id number]) [id number] Page 3 of 8 []  - 0 Specimen(s) / Culture(s) sent or taken to Lab for analysis []  - 0 Patient Transfer (multiple staff / Michiel Sites Robertson / Similar devices) []  - 0 Simple Staple / Suture removal (25 or less) []  - 0 Complex Staple / Suture removal (26 or more) []  - 0 Hypo / Hyperglycemic Management (close monitor of Blood Glucose) []  - 0 Ankle / Brachial Index (ABI) - do not check if billed separately X- 1 5 Vital Signs Has the patient been seen at the hospital within the last three years: Yes Total Score: 75 Level Of Care: New/Established - Level 2 Electronic Signature(s) Signed: 09/19/2022 4:09:38 PM By: Monica Aver MSN RN CNS WTA Entered By: [name] on 09/19/2022 11:05:20 -------------------------------------------------------------------------------- Encounter Discharge Information Details Patient Name: Date of Service: [name]. 09/19/2022 1:15 PM Medical Record Number: [id number] Patient Account Number: [id number] Date of Birth/Sex: Treating RN: [birth year] (89 y.o. [name] Primary Care [name]: [name] Other Clinician: Referring [name]: Treating [name]/Extender: [name] in Treatment:  17 Encounter Discharge Information Items Discharge Condition: Stable Ambulatory Status: Ambulatory Discharge Destination: Home Transportation: Private Auto Accompanied By: self Schedule Follow-up Appointment: Monica Clinical Summary of Care: Electronic Signature(s) Signed: 09/19/2022 4:09:38 PM By: Monica Aver MSN RN CNS WTA Entered By: [name] on 09/19/2022 11:07:23 -------------------------------------------------------------------------------- Lower Extremity Assessment Details Patient Name: Date of Service: [name]. 09/19/2022 1:15 PM Medical Record Number: [id number] Patient Account Number: [id number] [name], [name] ([id number]) [id number].pdf Page 4 of 8 Date of Birth/Sex: Treating RN: 02/24/1933 (89 y.o. [name] Primary Care Moesha Sarchet: Other Clinician: Bethann Robertson Referring Lido Maske: Treating Verlia Kaney/Extender: [name] in Treatment: 17 Electronic Signature(s) Signed: 09/19/2022 1:51:24 PM By: Monica Aver MSN RN CNS WTA Entered By: [name] on 09/19/2022 10:51:24 -------------------------------------------------------------------------------- Multi Wound Chart Details Patient Name: Date of Service: [name]. 09/19/2022 1:15 PM Medical Record Number: [id number] Patient Account Number: [id number] Date of Birth/Sex: Treating RN: 09-03-1933 (89 y.o. [name] Primary Care Chandrea Zellman: [name] Other Clinician: Referring Shevette Bess: Treating Sherley Mckenney/Extender: [name] in Treatment: 17 Vital Signs Height(in): 62 Pulse(bpm): 90 Weight(lbs): 118 Blood Pressure(mmHg): 128/72 Body Mass Index(BMI): 21.6 Temperature(F): 97.7 Respiratory Rate(breaths/min): 18 [1:Photos:] [N/A:N/A] Right Calcaneus N/A N/A Wound Location: Pressure Injury N/A N/A Wounding Event: Pressure Ulcer N/A N/A Primary Etiology: Hypertension, Rheumatoid Arthritis N/A N/A Comorbid History: 03/25/2022 N/A  N/A Date Acquired: 17 N/A N/A Weeks of Treatment: Healed - Epithelialized N/A N/A Wound Status: Monica N/A N/A Wound Recurrence: 0x0x0 N/A N/A Measurements L x W x D (cm) 0 N/A N/A A (cm) : rea 0 N/A N/A Volume (cm) : 90.60% N/A N/A % Reduction in A rea: 90.50% N/A N/A % Reduction in Volume: Category/Stage IV N/A N/A Classification: Medium N/A N/A Exudate A mount: Serosanguineous N/A N/A Exudate Type: red, brown N/A N/A Exudate Color: None Present (0%)  N/A N/A Granulation A mount: Large (67-100%) N/A N/A Necrotic A mount: Fat Layer (Subcutaneous Tissue): Yes N/A N/A Exposed Structures: Fascia: Monica Tendon: Monica Muscle: Monica Joint: Monica Bone: Monica None N/A N/A EpithelializationMCKYNLEIGH, [name] ([id number]) [id number] Page 5 of 8 Treatment Notes Electronic Signature(s) Signed: 09/19/2022 4:09:38 PM By: Monica Aver MSN RN CNS WTA Previous Signature: 09/19/2022 1:51:30 PM Version By: Monica Aver MSN RN CNS WTA Entered By: [name] on 09/19/2022 11:03:51 -------------------------------------------------------------------------------- Multi-Disciplinary Care Plan Details Patient Name: Date of Service: [name]. 09/19/2022 1:15 PM Medical Record Number: [id number] Patient Account Number: [id number] Date of Birth/Sex: Treating RN: 1933/08/21 (89 y.o. [name] Primary Care Kem Hensen: [name] Other Clinician: Referring Klara Stjames: Treating Kosei Rhodes/Extender: [name] in Treatment: 17 Active Inactive Electronic Signature(s) Signed: 09/19/2022 2:13:37 PM By: Monica Aver MSN RN CNS WTA Entered By: [name] on 09/19/2022 11:13:36 -------------------------------------------------------------------------------- Pain Assessment Details Patient Name: Date of Service: [name]. 09/19/2022 1:15 PM Medical Record Number: [id number] Patient Account Number: [id number] Date of Birth/Sex: Treating RN: [birth year] (89  y.o. [name] Primary Care Paulino Cork: [name] Other Clinician: Referring Elaine Roanhorse: Treating Marbeth Smedley/Extender: [name] in Treatment: 17 Active Problems Location of Pain Severity and Description of Pain Patient Has Paino Monica Site Locations [name], [name] ([id number]) [id number] Page 6 of 8 Pain Management and Medication Current Pain Management: Electronic Signature(s) Signed: 09/19/2022 1:51:14 PM By: Monica Aver MSN RN CNS WTA Entered By: [name] on 09/19/2022 10:51:14 -------------------------------------------------------------------------------- Patient/Caregiver Education Details Patient Name: Date of Service: [name]. 9/10/2024andnbsp1:15 PM Medical Record Number: [id number] Patient Account Number: [id number] Date of Birth/Gender: Treating RN: [birth year] (89 y.o. [name] Primary Care Physician: [name] Other Clinician: Referring Physician: Treating Physician/Extender: [name] in Treatment: 17 Education Assessment Education Provided To: Patient Education Topics Provided Discharge Packet: Handouts: Foot Care Methods: Explain/Verbal Responses: State content correctly Electronic Signature(s) Signed: 09/19/2022 4:09:38 PM By: Monica Aver MSN RN CNS WTA Entered By: [name] on 09/19/2022 11:06:42 [name] ([id number]) [id number] Page 7 of 8 -------------------------------------------------------------------------------- Wound Assessment Details Patient Name: Date of Service: [name]. 09/19/2022 1:15 PM Medical Record Number: [id number] Patient Account Number: [id number] Date of Birth/Sex: Treating RN: May 05, 1933 (89 y.o. [name] Primary Care Mina Carlisi: [name] Other Clinician: Referring Hiran Leard: Treating Maveryck Bahri/Extender: [name] in Treatment: 17 Wound Status Wound Number: 1 Primary  Etiology: Pressure Ulcer Wound Location: Right Calcaneus Wound Status: Healed - Epithelialized Wounding Event: Pressure Injury Comorbid History: Hypertension, Rheumatoid Arthritis Date Acquired: 03/25/2022 Weeks Of Treatment: 17 Clustered Wound: Monica Photos Wound Measurements Length: (cm) 0 Width: (cm) 0 Depth: (cm) 0 Area: (cm) 0 Volume: (cm) 0 % Reduction in Area: 90.6% % Reduction in Volume: 90.5% Epithelialization: None Wound Description Classification: Category/Stage IV Exudate Amount: Medium Exudate Type: Serosanguineous Exudate Color: red, brown Foul Odor After Cleansing: Monica Slough/Fibrino Yes Wound Bed Granulation Amount: None Present (0%) Exposed Structure Necrotic Amount: Large (67-100%) Fascia Exposed: Monica Fat Layer (Subcutaneous Tissue) Exposed: Yes Tendon Exposed: Monica Muscle Exposed: Monica Joint Exposed: Monica Bone Exposed: Monica Treatment Notes Wound #1 (Calcaneus) Wound Laterality: Right Cleanser Peri-Wound Care Topical [name], [name] ([id number]) [id number] Page 8 of 8 Primary Dressing Secondary Dressing Secured With Compression Wrap Compression Stockings Add-Ons Electronic Signature(s) Signed: 09/19/2022 4:09:38 PM By: Monica Aver MSN RN CNS WTA Entered By: [name] on 09/19/2022 11:03:25 --------------------------------------------------------------------------------  Vitals Details Patient Name: Date of Service: [name]. 09/19/2022 1:15 PM Medical Record Number: [id number] Patient Account Number: [id number] Date of Birth/Sex: Treating RN: 21-Nov-1933 (89 y.o. [name] Primary Care Sueann Brownley: [name] Other Clinician: Referring Quintyn Dombek: Treating Keeley Sussman/Extender: [name] in Treatment: 17 Vital Signs Time Taken: 13:15 Temperature (F): 97.7 Height (in): 62 Pulse (bpm): 90 Weight (lbs): 118 Respiratory Rate (breaths/min): 18 Body Mass Index (BMI): 21.6 Blood Pressure (mmHg):  128/72 Reference Range: 80 - 120 mg / dl Electronic Signature(s) Signed: 09/19/2022 1:51:09 PM By: Monica Aver MSN RN CNS WTA Entered By: [name] on 09/19/2022 10:51:09

## 2022-10-09 ENCOUNTER — Ambulatory Visit
Admission: RE | Admit: 2022-10-09 | Discharge: 2022-10-09 | Disposition: A | Discharge status: Home / Self Care | Payer: Medicare Other | Source: Ambulatory Visit | Attending: Internal Medicine | Admitting: Internal Medicine

## 2022-10-09 ENCOUNTER — Other Ambulatory Visit: Payer: Self-pay | Admitting: Internal Medicine

## 2022-10-09 DIAGNOSIS — R1084 Generalized abdominal pain: Secondary | ICD-10-CM

## 2022-10-09 DIAGNOSIS — A4159 Other Gram-negative sepsis: Secondary | ICD-10-CM | POA: Diagnosis not present

## 2022-10-09 DIAGNOSIS — R634 Abnormal weight loss: Secondary | ICD-10-CM | POA: Insufficient documentation

## 2022-10-09 DIAGNOSIS — R109 Unspecified abdominal pain: Secondary | ICD-10-CM | POA: Diagnosis not present

## 2022-10-09 LAB — POCT I-STAT CREATININE: Creatinine, Ser: 1.5 mg/dL — ABNORMAL HIGH (ref 0.44–1.00)

## 2022-10-09 MED ORDER — IOHEXOL 300 MG/ML  SOLN
80.0000 mL | Freq: Once | INTRAMUSCULAR | Status: AC | PRN
  Administered 2022-10-09: 65 mL via INTRAVENOUS

## 2022-10-10 ENCOUNTER — Encounter: Payer: Self-pay | Admitting: Emergency Medicine

## 2022-10-10 ENCOUNTER — Other Ambulatory Visit: Payer: Self-pay

## 2022-10-10 ENCOUNTER — Inpatient Hospital Stay
Admission: EM | Admit: 2022-10-10 | Discharge: 2022-10-17 | DRG: 854 | Disposition: A | Discharge status: Home Health | Payer: Medicare Other | Attending: Internal Medicine | Admitting: Internal Medicine

## 2022-10-10 DIAGNOSIS — K5732 Diverticulitis of large intestine without perforation or abscess without bleeding: Secondary | ICD-10-CM | POA: Insufficient documentation

## 2022-10-10 DIAGNOSIS — N3090 Cystitis, unspecified without hematuria: Secondary | ICD-10-CM

## 2022-10-10 DIAGNOSIS — Z85850 Personal history of malignant neoplasm of thyroid: Secondary | ICD-10-CM

## 2022-10-10 DIAGNOSIS — N321 Vesicointestinal fistula: Secondary | ICD-10-CM

## 2022-10-10 DIAGNOSIS — B961 Klebsiella pneumoniae [K. pneumoniae] as the cause of diseases classified elsewhere: Secondary | ICD-10-CM | POA: Diagnosis present

## 2022-10-10 DIAGNOSIS — D638 Anemia in other chronic diseases classified elsewhere: Secondary | ICD-10-CM | POA: Diagnosis present

## 2022-10-10 DIAGNOSIS — N179 Acute kidney failure, unspecified: Secondary | ICD-10-CM | POA: Diagnosis not present

## 2022-10-10 DIAGNOSIS — K632 Fistula of intestine: Secondary | ICD-10-CM | POA: Diagnosis not present

## 2022-10-10 DIAGNOSIS — Z888 Allergy status to other drugs, medicaments and biological substances status: Secondary | ICD-10-CM

## 2022-10-10 DIAGNOSIS — Z79899 Other long term (current) drug therapy: Secondary | ICD-10-CM

## 2022-10-10 DIAGNOSIS — Z87891 Personal history of nicotine dependence: Secondary | ICD-10-CM

## 2022-10-10 DIAGNOSIS — D75839 Thrombocytosis, unspecified: Secondary | ICD-10-CM | POA: Insufficient documentation

## 2022-10-10 DIAGNOSIS — Z96652 Presence of left artificial knee joint: Secondary | ICD-10-CM | POA: Diagnosis present

## 2022-10-10 DIAGNOSIS — D649 Anemia, unspecified: Secondary | ICD-10-CM | POA: Insufficient documentation

## 2022-10-10 DIAGNOSIS — Z8701 Personal history of pneumonia (recurrent): Secondary | ICD-10-CM

## 2022-10-10 DIAGNOSIS — E890 Postprocedural hypothyroidism: Secondary | ICD-10-CM | POA: Diagnosis present

## 2022-10-10 DIAGNOSIS — R6520 Severe sepsis without septic shock: Secondary | ICD-10-CM

## 2022-10-10 DIAGNOSIS — A419 Sepsis, unspecified organism: Principal | ICD-10-CM

## 2022-10-10 DIAGNOSIS — K429 Umbilical hernia without obstruction or gangrene: Secondary | ICD-10-CM | POA: Diagnosis present

## 2022-10-10 DIAGNOSIS — E876 Hypokalemia: Secondary | ICD-10-CM | POA: Diagnosis not present

## 2022-10-10 DIAGNOSIS — M1990 Unspecified osteoarthritis, unspecified site: Secondary | ICD-10-CM | POA: Diagnosis present

## 2022-10-10 DIAGNOSIS — K5792 Diverticulitis of intestine, part unspecified, without perforation or abscess without bleeding: Secondary | ICD-10-CM | POA: Diagnosis not present

## 2022-10-10 DIAGNOSIS — Z90710 Acquired absence of both cervix and uterus: Secondary | ICD-10-CM

## 2022-10-10 DIAGNOSIS — Z8619 Personal history of other infectious and parasitic diseases: Secondary | ICD-10-CM

## 2022-10-10 DIAGNOSIS — Z23 Encounter for immunization: Secondary | ICD-10-CM

## 2022-10-10 DIAGNOSIS — Z905 Acquired absence of kidney: Secondary | ICD-10-CM

## 2022-10-10 DIAGNOSIS — E871 Hypo-osmolality and hyponatremia: Secondary | ICD-10-CM | POA: Diagnosis present

## 2022-10-10 DIAGNOSIS — I10 Essential (primary) hypertension: Secondary | ICD-10-CM | POA: Diagnosis present

## 2022-10-10 DIAGNOSIS — K219 Gastro-esophageal reflux disease without esophagitis: Secondary | ICD-10-CM | POA: Diagnosis present

## 2022-10-10 DIAGNOSIS — Z79890 Hormone replacement therapy (postmenopausal): Secondary | ICD-10-CM

## 2022-10-10 DIAGNOSIS — A4159 Other Gram-negative sepsis: Principal | ICD-10-CM | POA: Diagnosis present

## 2022-10-10 DIAGNOSIS — K5740 Diverticulitis of both small and large intestine with perforation and abscess without bleeding: Secondary | ICD-10-CM | POA: Diagnosis present

## 2022-10-10 DIAGNOSIS — N136 Pyonephrosis: Secondary | ICD-10-CM | POA: Diagnosis present

## 2022-10-10 DIAGNOSIS — E039 Hypothyroidism, unspecified: Secondary | ICD-10-CM | POA: Insufficient documentation

## 2022-10-10 LAB — COMPREHENSIVE METABOLIC PANEL
ALT: 14 U/L (ref 0–44)
AST: 13 U/L — ABNORMAL LOW (ref 15–41)
Albumin: 2.7 g/dL — ABNORMAL LOW (ref 3.5–5.0)
Alkaline Phosphatase: 86 U/L (ref 38–126)
Anion gap: 13 (ref 5–15)
BUN: 28 mg/dL — ABNORMAL HIGH (ref 8–23)
CO2: 23 mmol/L (ref 22–32)
Calcium: 8.6 mg/dL — ABNORMAL LOW (ref 8.9–10.3)
Chloride: 97 mmol/L — ABNORMAL LOW (ref 98–111)
Creatinine, Ser: 1.48 mg/dL — ABNORMAL HIGH (ref 0.44–1.00)
GFR, Estimated: 34 mL/min — ABNORMAL LOW (ref >60)
Glucose, Bld: 119 mg/dL — ABNORMAL HIGH (ref 70–99)
Potassium: 3.7 mmol/L (ref 3.5–5.1)
Sodium: 133 mmol/L — ABNORMAL LOW (ref 135–145)
Total Bilirubin: 0.7 mg/dL (ref 0.3–1.2)
Total Protein: 7.6 g/dL (ref 6.5–8.1)

## 2022-10-10 LAB — LACTIC ACID, PLASMA
Lactic Acid, Venous: 1.1 mmol/L (ref 0.5–1.9)
Lactic Acid, Venous: 0.9 mmol/L (ref 0.5–1.9)

## 2022-10-10 LAB — URINALYSIS, ROUTINE W REFLEX MICROSCOPIC
Bilirubin Urine: NEGATIVE
Glucose, UA: NEGATIVE mg/dL
Ketones, ur: 5 mg/dL — AB
Nitrite: NEGATIVE
Protein, ur: 100 mg/dL — AB
Specific Gravity, Urine: 1.027 (ref 1.005–1.030)
Squamous Epithelial / HPF: 0 /[HPF] (ref 0–5)
WBC, UA: >50 WBC/hpf (ref 0–5)
pH: 6 (ref 5.0–8.0)

## 2022-10-10 LAB — CBC
HCT: 32.8 % — ABNORMAL LOW (ref 36.0–46.0)
Hemoglobin: 10.8 g/dL — ABNORMAL LOW (ref 12.0–15.0)
MCH: 28.3 pg (ref 26.0–34.0)
MCHC: 32.9 g/dL (ref 30.0–36.0)
MCV: 85.9 fL (ref 80.0–100.0)
Platelets: 498 10*3/uL — ABNORMAL HIGH (ref 150–400)
RBC: 3.82 MIL/uL — ABNORMAL LOW (ref 3.87–5.11)
RDW: 14.6 % (ref 11.5–15.5)
WBC: 22.2 10*3/uL — ABNORMAL HIGH (ref 4.0–10.5)
nRBC: 0 % (ref 0.0–0.2)

## 2022-10-10 LAB — LIPASE, BLOOD: Lipase: 41 U/L (ref 11–51)

## 2022-10-10 MED ORDER — PANTOPRAZOLE SODIUM 40 MG PO TBEC
40.0000 mg | DELAYED_RELEASE_TABLET | Freq: Every day | ORAL | Status: DC
  Administered 2022-10-10 – 2022-10-17 (×7): 40 mg via ORAL
  Filled 2022-10-10 (×7): qty 1

## 2022-10-10 MED ORDER — ONDANSETRON HCL 4 MG/2ML IJ SOLN: 4.0000 mg | Freq: Four times a day (QID) | INTRAMUSCULAR | Status: DC | PRN

## 2022-10-10 MED ORDER — ACETAMINOPHEN 650 MG RE SUPP: 650.0000 mg | Freq: Four times a day (QID) | RECTAL | Status: DC | PRN

## 2022-10-10 MED ORDER — ONDANSETRON HCL 4 MG PO TABS: 4.0000 mg | ORAL_TABLET | Freq: Four times a day (QID) | ORAL | Status: DC | PRN

## 2022-10-10 MED ORDER — LEVOTHYROXINE SODIUM 50 MCG PO TABS
75.0000 ug | ORAL_TABLET | Freq: Every day | ORAL | Status: DC
  Administered 2022-10-11: 75 ug via ORAL
  Filled 2022-10-10: qty 2

## 2022-10-10 MED ORDER — SODIUM CHLORIDE 0.9 % IV SOLN: Freq: Once | INTRAVENOUS | Status: AC

## 2022-10-10 MED ORDER — PIPERACILLIN-TAZOBACTAM 3.375 G IVPB 30 MIN: 3.3750 g | Freq: Once | INTRAVENOUS | Status: DC

## 2022-10-10 MED ORDER — ACETAMINOPHEN 325 MG PO TABS
650.0000 mg | ORAL_TABLET | Freq: Four times a day (QID) | ORAL | Status: DC | PRN
  Administered 2022-10-11: 650 mg via ORAL
  Filled 2022-10-10: qty 2

## 2022-10-10 MED ORDER — SODIUM CHLORIDE 0.9 % IV SOLN
2.0000 g | INTRAVENOUS | Status: DC
  Administered 2022-10-11: 2 g via INTRAVENOUS
  Filled 2022-10-10: qty 20

## 2022-10-10 MED ORDER — METRONIDAZOLE 500 MG/100ML IV SOLN
500.0000 mg | Freq: Two times a day (BID) | INTRAVENOUS | Status: DC
  Administered 2022-10-10 – 2022-10-11 (×2): 500 mg via INTRAVENOUS
  Filled 2022-10-10 (×3): qty 100

## 2022-10-10 MED ORDER — PNEUMOCOCCAL 20-VAL CONJ VACC 0.5 ML IM SUSY
0.5000 mL | PREFILLED_SYRINGE | INTRAMUSCULAR | Status: AC
  Administered 2022-10-11: 0.5 mL via INTRAMUSCULAR
  Filled 2022-10-10: qty 0.5

## 2022-10-10 MED ORDER — LACTATED RINGERS IV SOLN: INTRAVENOUS | Status: DC

## 2022-10-10 MED ORDER — INFLUENZA VAC A&B SURF ANT ADJ 0.5 ML IM SUSY
0.5000 mL | PREFILLED_SYRINGE | INTRAMUSCULAR | Status: AC
  Administered 2022-10-11: 0.5 mL via INTRAMUSCULAR
  Filled 2022-10-10: qty 0.5

## 2022-10-10 MED ORDER — MELATONIN 5 MG PO TABS
2.5000 mg | ORAL_TABLET | Freq: Every evening | ORAL | Status: DC | PRN
  Filled 2022-10-10: qty 1

## 2022-10-10 MED ORDER — SODIUM CHLORIDE 0.9 % IV SOLN
2.0000 g | Freq: Once | INTRAVENOUS | Status: AC
  Administered 2022-10-10: 2 g via INTRAVENOUS
  Filled 2022-10-10: qty 20

## 2022-10-10 MED ORDER — METRONIDAZOLE 500 MG/100ML IV SOLN
500.0000 mg | Freq: Once | INTRAVENOUS | Status: AC
  Administered 2022-10-10: 500 mg via INTRAVENOUS
  Filled 2022-10-10: qty 100

## 2022-10-10 MED ORDER — POLYETHYLENE GLYCOL 3350 17 G PO PACK: 17.0000 g | PACK | Freq: Every day | ORAL | Status: DC | PRN

## 2022-10-10 NOTE — Sepsis Progress Note (Signed)
Elink monitoring for the code sepsis protocol.  

## 2022-10-10 NOTE — ED Provider Notes (Signed)
[facility] Provider Note    Event Date/Time   First MD Initiated Contact with Patient 10/10/22 1002     (approximate)   History   Abdominal Pain   HPI  [name] is a 89 y.o. female with a history of hypothyroidism who presents with abdominal pain.  Patient reports she has been having abdominal pain for over a month, she states she was treated with antibiotics for C. difficile approximately 2 months ago but pain has continued.  PCP ordered CT scan which was obtained yesterday and she was referred to the emergency department today given results.     Physical Exam   Triage Vital Signs: ED Triage Vitals  Encounter Vitals Group     BP 10/10/22 0931 (!) 101/55     Systolic BP Percentile --      Diastolic BP Percentile --      Pulse Rate 10/10/22 0931 (!) 110     Resp 10/10/22 0931 17     Temp 10/10/22 0931 (!) 97 F (36.1 C)     Temp src --      SpO2 10/10/22 0931 99 %     Weight 10/10/22 0933 53.5 kg (117 lb 15.1 oz)     Height 10/10/22 0933 1.575 m (5\' 2" )     Head Circumference --      Peak Flow --      Pain Score 10/10/22 0932 0     Pain Loc --      Pain Education --      Exclude from Growth Chart --     Most recent vital signs: Vitals:   10/10/22 0931 10/10/22 1140  BP: (!) 101/55 106/60  Pulse: (!) 110 90  Resp: 17 18  Temp: (!) 97 F (36.1 C) 97.8 F (36.6 C)  SpO2: 99% 99%     General: Awake, no distress.  CV:  Good peripheral perfusion.  Tachycardia Resp:  Normal effort.  Abd:  No distention.  Tenderness lower abdomen Other:     ED Results / Procedures / Treatments   Labs (all labs ordered are listed, but only abnormal results are displayed) Labs Reviewed  COMPREHENSIVE METABOLIC PANEL - Abnormal; Notable for the following components:      Result Value   Sodium 133 (*)    Chloride 97 (*)    Glucose, Bld 119 (*)    BUN 28 (*)    Creatinine, Ser 1.48 (*)    Calcium 8.6 (*)    Albumin 2.7 (*)    AST 13 (*)     GFR, Estimated 34 (*)    All other components within normal limits  CBC - Abnormal; Notable for the following components:   WBC 22.2 (*)    RBC 3.82 (*)    Hemoglobin 10.8 (*)    HCT 32.8 (*)    Platelets 498 (*)    All other components within normal limits  URINALYSIS, ROUTINE W REFLEX MICROSCOPIC - Abnormal; Notable for the following components:   Color, Urine YELLOW (*)    APPearance CLOUDY (*)    Hgb urine dipstick SMALL (*)    Ketones, ur 5 (*)    Protein, ur 100 (*)    Leukocytes,Ua LARGE (*)    Bacteria, UA MANY (*)    All other components within normal limits  CULTURE, BLOOD (ROUTINE X 2)  CULTURE, BLOOD (ROUTINE X 2)  URINE CULTURE  LIPASE, BLOOD  LACTIC ACID, PLASMA  LACTIC ACID, PLASMA  EKG     RADIOLOGY Reviewed CT scan performed yesterday with patient, multiple fistulas noted    PROCEDURES:  Critical Care performed: yes  CRITICAL CARE Performed by: Jene Every   Total critical care time: 30 minutes  Critical care time was exclusive of separately billable procedures and treating other patients.  Critical care was necessary to treat or prevent imminent or life-threatening deterioration.  Critical care was time spent personally by me on the following activities: development of treatment plan with patient and/or surrogate as well as nursing, discussions with consultants, evaluation of patient's response to treatment, examination of patient, obtaining history from patient or surrogate, ordering and performing treatments and interventions, ordering and review of laboratory studies, ordering and review of radiographic studies, pulse oximetry and re-evaluation of patient's condition.   Procedures   MEDICATIONS ORDERED IN ED: Medications  cefTRIAXone (ROCEPHIN) 2 g in sodium chloride 0.9 % 100 mL IVPB (2 g Intravenous New Bag/Given 10/10/22 1125)  metroNIDAZOLE (FLAGYL) IVPB 500 mg (has no administration in time range)  0.9 %  sodium chloride  infusion ( Intravenous New Bag/Given 10/10/22 1119)     IMPRESSION / MDM / ASSESSMENT AND PLAN / ED COURSE  I reviewed the triage vital signs and the nursing notes. Patient's presentation is most consistent with acute presentation with potential threat to life or bodily function.  Patient presents with abdominal pain, tachycardia.  Her temperature is 58 F.  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  Treating with IV Rocephin, IV Flagyl given likely intra-abdominal source versus UTI,   Fluids infusing  Hospitalist team to admit        FINAL CLINICAL IMPRESSION(S) / ED DIAGNOSES   Final diagnoses:  Sepsis without acute organ dysfunction, due to unspecified organism [facility])     Rx / DC Orders   ED Discharge Orders     None        Note:  This document was prepared using Dragon voice recognition software and may include unintentional dictation errors.   Jene Every, MD 10/10/22 [phone]

## 2022-10-10 NOTE — H&P (Signed)
History and Physical    Patient: [name] KVQ:[id number] DOB: 31-Jan-1933 DOA: 10/10/2022 DOS: the patient was seen and examined on 10/10/2022 PCP: Danella Penton, MD  Patient coming from: Home  Chief Complaint:  Chief Complaint  Patient presents with   Abdominal Pain   HPI: [name] is a 89 y.o. female with medical history significant of hypothyroidism, thyroid cancer status post partial thyroidectomy, essential hypertension not on any home medications, C. difficile colitis, and GERD.  She presents to [facility] ED after getting a call from her PCP who ordered an abdominal CT yesterday and read obtained today. 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 She reports she has experienced abdominal pain for months, was treated for C. difficile colitis 2 months ago which improved the pain but did not totally relieve it.  She endorses poor appetite secondary and weight loss. She denies fevers, nausea, vomiting, diarrhea, or constipation.    ED Course: On arrival to the ED she was noted to be afebrile temp 36.1C, BP 101/55 with a MAP of 65, HR 110, RR 17, SpO2 99% on room air.  Labs notable for leukocytosis WBC 22.2, hemoglobin 10.8, platelets 498, sodium 133, chloride 97, creatinine 1.48, albumin 2.7.  Lipase negative at 41. Lactic acid 1.1.  Urinalysis shows large leukocytes, greater than 50 WBCs, and many bacteria concerning for urinary tract infection.  Blood and urine cultures collected and pending at this time.  She was started on Rocephin and Flagyl and given a 1 L fluid bolus.General surgery consulted by EDP and [facility] contacted for admission.  Review of Systems: As mentioned in the history of present illness. All other systems reviewed and are negative. Past Medical History:  Diagnosis Date   Arthritis    Asthma    as a child    Cancer (HCC)    GERD (gastroesophageal reflux disease)    Hypothyroidism    Pneumonia 1971   Thyroid nodule    Past Surgical History:  Procedure Laterality Date   ABDOMINAL HYSTERECTOMY     CATARACT EXTRACTION, BILATERAL     KNEE ARTHROPLASTY Left 03/24/2022   Procedure: COMPUTER ASSISTED TOTAL KNEE ARTHROPLASTY;  Surgeon: Donato Heinz, MD;  Location: [facility] ORS;  Service: Orthopedics;  Laterality: Left;   thyroid gland removed  1977   THYROIDECTOMY, PARTIAL Left    Social History:  reports that she quit smoking about 19 years ago. Her smoking use included cigarettes. She started smoking about 61 years ago. She has a 42 pack-year smoking history. She has never used smokeless tobacco. She reports that she does not drink alcohol and does not use drugs.  Allergies  Allergen Reactions   Indomethacin Other (See Comments) and Palpitations    History reviewed. No pertinent family history.  Prior to Admission medications   Medication Sig Start Date End Date Taking? Authorizing Provider  acetaminophen (TYLENOL) 500 MG tablet Take 2 tablets (1,000 mg total) by mouth every 6 (six) hours. 11/29/19   Duanne Guess, MD  APPLE CIDER VINEGAR PO Take 1 tablet by mouth daily. 1 gummie daily    [provider]  ascorbic acid (VITAMIN C) 1000 MG tablet Take 1,000 mg by mouth daily.     [provider]  aspirin-acetaminophen-caffeine (EXCEDRIN MIGRAINE) [phone] MG tablet Take 1 tablet by mouth every 6 (six) hours as needed for headache.    [provider]  Calcium  Carbonate-Vitamin D 600-200 MG-UNIT CAPS Take 1,200 mg by mouth daily. 11/29/19   Duanne Guess, MD  CALCIUM CITRATE PO Take 2 tablets by mouth daily. 600 mg    [provider]  celecoxib (CELEBREX) 200 MG capsule Take 1 capsule (200 mg total) by mouth 2 (two) times daily. 03/25/22   Anson Oregon, PA-C  Cyanocobalamin 5000 MCG TBDP Take 5,000 mcg by mouth daily.     [provider]   diclofenac Sodium (VOLTAREN ARTHRITIS PAIN) 1 % GEL Apply 2 g topically as needed.    [provider]  enoxaparin (LOVENOX) 40 MG/0.4ML injection Inject 0.4 mLs (40 mg total) into the skin daily. 03/25/22   Anson Oregon, PA-C  folic acid (FOLVITE) 1 MG tablet Take 1 mg by mouth daily.    [provider]  hydrochlorothiazide (HYDRODIURIL) 25 MG tablet Take 25 mg by mouth daily.  08/01/19   [provider]  levothyroxine (SYNTHROID) 75 MCG tablet Take 1 tablet (75 mcg total) by mouth daily at 6 (six) AM. 11/30/19   Duanne Guess, MD  methotrexate (RHEUMATREX) 2.5 MG tablet Take 15 mg by mouth every Thursday. 7.5 mg once a week 10/16/19   [provider]  Multiple Vitamin (MULTI-VITAMIN) tablet Take 1 tablet by mouth daily. Centrum    [provider]  ondansetron (ZOFRAN) 4 MG tablet Take 1 tablet (4 mg total) by mouth every 6 (six) hours as needed for nausea. 03/25/22   Anson Oregon, PA-C  OVER THE COUNTER MEDICATION 1 Scoop daily. Multi collagen protein powder 1 scoop daily    [provider]  oxyCODONE (OXY IR/ROXICODONE) 5 MG immediate release tablet Take 1 tablet (5 mg total) by mouth every 4 (four) hours as needed for severe pain. 03/25/22   Anson Oregon, PA-C  pantoprazole (PROTONIX) 40 MG tablet Take 40 mg by mouth daily.    [provider]  traMADol (ULTRAM) 50 MG tablet Take 1-2 tablets (50-100 mg total) by mouth every 4 (four) hours as needed for moderate pain. 03/25/22   Anson Oregon, PA-C  TURMERIC PO Take 1,000 mg by mouth daily.    [provider]    Physical Exam: Vitals:   10/10/22 0931 10/10/22 0933  BP: (!) 101/55   Pulse: (!) 110   Resp: 17   Temp: (!) 97 F (36.1 C)   SpO2: 99%   Weight:  53.5 kg  Height:  5\' 2"  (1.575 m)   Constitutional: Well appearing elderly female, NAD, calm, comfortable Eyes: PERRL, lids and conjunctivae normal ENMT: Mucous membranes are moist.  Posterior pharynx clear of any exudate or lesions.  Neck: normal, supple, no masses, no thyromegaly Respiratory: clear to auscultation bilaterally, no wheezing, no crackles. Normal respiratory effort. No accessory muscle use.  Cardiovascular: Regular rate and rhythm, no murmurs / rubs / gallops. No extremity edema. 2+ radial and pedal pulses.   Abdomen: LLQ tenderness on palpation, mild lower abdominal distention, no masses palpated. No hepatosplenomegaly. Bowel sounds positive.  Musculoskeletal: no clubbing / cyanosis. No joint deformity upper and lower extremities. Good ROM, no contractures. Normal muscle tone.  Skin: no rashes, lesions, ulcers.  Neurologic:  Alert and oriented x 3. Normal mood.   Data Reviewed: CBC    Component Value Date/Time   WBC 22.2 (H) 10/10/2022 0934   RBC 3.82 (L) 10/10/2022 0934   HGB 10.8 (L) 10/10/2022 0934   HCT 32.8 (L) 10/10/2022 0934   PLT 498 (H) 10/10/2022 6045  MCV 85.9 10/10/2022 0934   MCH 28.3 10/10/2022 0934   MCHC 32.9 10/10/2022 0934   RDW 14.6 10/10/2022 0934   CMP     Component Value Date/Time   NA 133 (L) 10/10/2022 0934   K 3.7 10/10/2022 0934   CL 97 (L) 10/10/2022 0934   CO2 23 10/10/2022 0934   GLUCOSE 119 (H) 10/10/2022 0934   BUN 28 (H) 10/10/2022 0934   CREATININE 1.48 (H) 10/10/2022 0934   CALCIUM 8.6 (L) 10/10/2022 0934   PROT 7.6 10/10/2022 0934   ALBUMIN 2.7 (L) 10/10/2022 0934   AST 13 (L) 10/10/2022 0934   ALT 14 10/10/2022 0934   ALKPHOS 86 10/10/2022 0934   BILITOT 0.7 10/10/2022 0934   GFRNONAA 34 (L) 10/10/2022 0934   Lactic Acid, Venous    Component Value Date/Time   LATICACIDVEN 1.1 10/10/2022 1045     Lipase     Component Value Date/Time   LIPASE 41 10/10/2022 0934   Urinalysis    Component Value Date/Time   COLORURINE YELLOW (A) 10/10/2022 0934   APPEARANCEUR CLOUDY (A) 10/10/2022 0934   LABSPEC 1.027 10/10/2022 0934   PHURINE 6.0 10/10/2022 0934   GLUCOSEU NEGATIVE 10/10/2022 0934    HGBUR SMALL (A) 10/10/2022 0934   BILIRUBINUR NEGATIVE 10/10/2022 0934   KETONESUR 5 (A) 10/10/2022 0934   PROTEINUR 100 (A) 10/10/2022 0934   NITRITE NEGATIVE 10/10/2022 0934   LEUKOCYTESUR LARGE (A) 10/10/2022 0934    CT ABDOMEN PELVIS W WO CONTRAST  Result Date: 10/09/2022 CLINICAL DATA:  Abdominal pain, weight loss, C diff colitis status post vancomycin EXAM: CT ABDOMEN AND PELVIS WITHOUT AND WITH CONTRAST TECHNIQUE: Multidetector CT imaging of the abdomen and pelvis was performed following the standard protocol before and following the bolus administration of intravenous contrast. RADIATION DOSE REDUCTION: This exam was performed according to the departmental dose-optimization program which includes automated exposure control, adjustment of the mA and/or kV according to patient size and/or use of iterative reconstruction technique. CONTRAST:  65mL OMNIPAQUE IOHEXOL 300 MG/ML  SOLN COMPARISON:  None Available. FINDINGS: Lower chest: Lung bases are clear. Hepatobiliary: Liver is within normal limits. Layering tiny gallstones (series 7/image 80), without associated inflammatory changes. No intrahepatic or extrahepatic ductal dilatation. Pancreas: Within normal limits. Spleen: Within normal limits. Adrenals/Urinary Tract: Adrenal glands are within normal limits. Subcentimeter posterior right upper pole renal cyst (series 2/image 17), benign (Bosniak I). Additional dominant 3.1 cm left lower pole renal cyst (series 12/image 30), benign (Bosniak I). No follow-up is recommended. No renal, ureteral, or bladder calculi. Moderate to severe left hydroureteronephrosis. Narrowing/extrinsic compression of the mid left ureter or (series 7/image 4), likely related to inflammatory changes (described below). Thick-walled bladder with mucosal hyperenhancement and nondependent gas (series 7, image 67), likely reflecting cystitis, favored to be secondary to a colovesical fistula (series 11/image 49). Stomach/Bowel:  Stomach is notable for a moderate hiatal hernia. No evidence of bowel obstruction. Appendix is not discretely visualized. Wall thickening involving mid/distal ileum in the central lower abdomen (series 7/image 2), with adjacent enterocolonic (series 7/image 5) and colocolonic fistula (series 7/image 61). Adjacent wall thickening involving the rectosigmoid colon (series 7/image 60). No drainable fluid collection/abscess. No free air. Vascular/Lymphatic: No evidence of abdominal aortic aneurysm. Atherosclerotic calcifications of the abdominal aorta and branch vessels, although vessels remain patent. No suspicious abdominopelvic lymphadenopathy. Reproductive: Status post hysterectomy. Right ovary is within normal limits. Left ovary is notable for a dominant 2.8 cm cyst/follicle (series 7, image 88). Other: No abdominopelvic ascites.  Musculoskeletal: Degenerative changes of the visualized thoracolumbar spine. IMPRESSION: Enterocolonic and colocolonic fistulas, with associated inflammatory changes involving the rectosigmoid colon and an adjacent loop of distal small bowel. No drainable fluid collection/abscess. No pneumatosis or free air. 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 Moderate to severe left hydroureteronephrosis, likely related to extrinsic compression by the inflammatory changes noted above. Additional ancillary findings as above. These results will be called to the ordering clinician or representative by the Radiologist Assistant, and communication documented in the PACS or [employer]. Electronically Signed   By: Charline Bills M.D.   On: 10/09/2022 20:13     Assessment and Plan: #Sepsis with Intra-abdominal Source #Multiple Colonic Fistulas  #Cystitis HR 110, WBC 22.2; CT Abdomen Pelvis shows enterocolonic and colocolonic fistulas with associated inflammatory changes, No fluid collection/abscess, no free air. Imaging also shows cystitis with suspected colovesical fistula and moderate to  severe (L) hydroureteronephrosis. - Continue Rocephin and Flagyl - IV Fluids: LR at 75 mL/hr - Follow Urine, Blood culture data - Follow Fever curve - General Surgery consulted by EDP for multiple colonic fistulas, appreciate their consultation and recommendations. - Gastroenterology consulted for multiple colonic fistulas concerning for inflammatory bowel process, appreciate their consultation and recommendations. - Urology consulted for left hydroureteronephrosis seen on imaging, appreciate their consultation and recommendations  #Acute Kidney Injury Likely post renal; moderate to severe (L) hydroureteronephrosis related to compression by inflammatory changes in bowel seen on CT Abdomen/Pelvis - IVF as above - Treat infection as above - Avoid nephrotoxic meds, Contrast Dyes, Hypotension and Dehydration  - Continue to Monitor and Trend Renal Function, follow up BMP with AM labs  #Normocytic Anemia Down ~1pt from June 2024 [facility] labs HGB 11.9 at that time -Follow up CBC with AM labs  #Thrombocytosis Appears chronic in nature, PLT was 463 on 06/19/22 in the [facility] system - Follow up CBC with AM labs  #Hypothyroidism  #Hx of Thyroid Cancer s/p Partial Thyroidectomy -Continue home Levothyroxine  #Essential Hypertension - Not on any outpatient medication  #GERD - Protonix  Advance Care Planning: Code Status: FULL  Consults: Gastroenterology and Urology called. General Surgery consulted by EDP, see filed note.  Family Communication: Daughter and brother at bedside  Severity of Illness: The appropriate patient status for this patient is OBSERVATION. Observation status is judged to be reasonable and necessary in order to provide the required intensity of service to ensure the patient's safety. The patient's presenting symptoms, physical exam findings, and initial radiographic and laboratory data in the context of their medical condition is felt to place them at decreased risk for  further clinical deterioration. Furthermore, it is anticipated that the patient will be medically stable for discharge from the hospital within 2 midnights of admission.   To reach the provider On-Call:   7AM- 7PM see care teams to locate the attending and reach out to them via [email or web address] Password: [email or web address] 7PM-7AM contact night-coverage If you still have difficulty reaching the appropriate provider, please page the [facility] (Director on Call) for [facility] on amion for assistance  This document was prepared using Conservation officer, historic buildings and may include unintentional dictation errors.  Bishop Limbo DNP, MBA, FNP-BC, PMHNP-BC Nurse Practitioner [facility] Pager [phone]

## 2022-10-10 NOTE — ED Triage Notes (Signed)
Pt here with abd pain. PT states she recently had suspected c.diff and finished her course of abx. Pt had a /CT scan yesterday that showed some fistulas. Pt also loss a lot of weight. Pt denies NV but some diarrhea yesterday.

## 2022-10-10 NOTE — Progress Notes (Signed)
CODE SEPSIS - PHARMACY COMMUNICATION  **Broad Spectrum Antibiotics should be administered within 1 hour of Sepsis diagnosis**  Time Code Sepsis Called/Page Received: 1032  Antibiotics Ordered: Ceftriaxone & metronidazole  Time of 1st antibiotic administration: 1125  Additional action taken by pharmacy: N/A  [name] 10/10/2022  10:36 AM

## 2022-10-10 NOTE — ED Notes (Signed)
See triage note Presents with some abnormal findings on her CT scan she had yesterday  States she has had some issues with diarrhea and abd pain Has antibiotics  w/o relief  States she has not been able to eat  Had increased pain last pm  But feel better this am

## 2022-10-10 NOTE — Consult Note (Signed)
[facility] SURGICAL CONSULTATION NOTE (initial) - cpt: 21308   HISTORY OF PRESENT ILLNESS (HPI):  89 y.o. female presented to [facility] ED today for evaluation of abdominal pain. Patient reports that following her left knee replacement in March of 2024, she was diagnosed and treated for C diff colitis. Brother at bedside reports this was never formally diagnosed with stool studies but she was treated with vancomycin. Today, she reports the diarrhea had improved and bowel movements had been normal. No blood or mucus. She did get diarrhea with PO contrast from CT however. She also is reporting lower abdominal pain and distension which is not improving. Biggest concern is her lack of appetite and continued weight loss. No fever, chills, cough, CP, SOB. She is unsure of when her last colonoscopy was. Only previous abdominal surgeries are C-section and abdominal hysterectomy. She underwent laboratory work up yesterday through her PCP which showed WBC to 16.6K (now 22.2K). Additional labs in the ED show sCr- 1.48 (? Baseline 1.16), hyponatremia to 133, UA positive for leukocytes, and venous lactate normal at 1.1. 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   Surgery is consulted by emergency medicine physician Dr. 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  PAST MEDICAL HISTORY (PMH):  Past Medical History:  Diagnosis Date   Arthritis    Asthma    as a child   Cancer (HCC)    GERD (gastroesophageal reflux disease)    Hypothyroidism    Pneumonia 1971   Thyroid nodule      PAST SURGICAL HISTORY (PSH):  Past Surgical History:  Procedure Laterality Date   ABDOMINAL HYSTERECTOMY     CATARACT EXTRACTION, BILATERAL     KNEE ARTHROPLASTY Left 03/24/2022   Procedure: COMPUTER ASSISTED TOTAL KNEE ARTHROPLASTY;  Surgeon: Donato Heinz,  MD;  Location: [facility] ORS;  Service: Orthopedics;  Laterality: Left;   thyroid gland removed  1977   THYROIDECTOMY, PARTIAL Left      MEDICATIONS:  Prior to Admission medications   Medication Sig Start Date End Date Taking? Authorizing Provider  acetaminophen (TYLENOL) 500 MG tablet Take 2 tablets (1,000 mg total) by mouth every 6 (six) hours. 11/29/19   Duanne Guess, MD  APPLE CIDER VINEGAR PO Take 1 tablet by mouth daily. 1 gummie daily    [provider]  ascorbic acid (VITAMIN C) 1000 MG tablet Take 1,000 mg by mouth daily.     [provider]  aspirin-acetaminophen-caffeine (EXCEDRIN MIGRAINE) [phone] MG tablet Take 1 tablet by mouth every 6 (six) hours as needed for headache.    [provider]  Calcium Carbonate-Vitamin D 600-200 MG-UNIT CAPS Take 1,200 mg by mouth daily. 11/29/19   Duanne Guess, MD  CALCIUM CITRATE PO Take 2 tablets by mouth daily. 600 mg    [provider]  celecoxib (CELEBREX) 200 MG capsule Take 1 capsule (200 mg total) by mouth 2 (two) times daily. 03/25/22   Anson Oregon, PA-C  Cyanocobalamin 5000 MCG TBDP Take 5,000 mcg by mouth daily.     [provider]  diclofenac Sodium (VOLTAREN ARTHRITIS PAIN) 1 % GEL Apply 2 g topically as needed.    [provider]  enoxaparin (LOVENOX) 40 MG/0.4ML injection Inject 0.4 mLs (40 mg total) into the skin daily. 03/25/22   Anson Oregon, PA-C  folic acid (FOLVITE) 1 MG tablet Take 1 mg by mouth  daily.    [provider]  hydrochlorothiazide (HYDRODIURIL) 25 MG tablet Take 25 mg by mouth daily.  08/01/19   [provider]  levothyroxine (SYNTHROID) 75 MCG tablet Take 1 tablet (75 mcg total) by mouth daily at 6 (six) AM. 11/30/19   Duanne Guess, MD  methotrexate (RHEUMATREX) 2.5 MG tablet Take 15 mg by mouth every Thursday. 7.5 mg once a week 10/16/19   [provider]  Multiple Vitamin (MULTI-VITAMIN) tablet Take 1 tablet by  mouth daily. Centrum    [provider]  ondansetron (ZOFRAN) 4 MG tablet Take 1 tablet (4 mg total) by mouth every 6 (six) hours as needed for nausea. 03/25/22   Anson Oregon, PA-C  OVER THE COUNTER MEDICATION 1 Scoop daily. Multi collagen protein powder 1 scoop daily    [provider]  oxyCODONE (OXY IR/ROXICODONE) 5 MG immediate release tablet Take 1 tablet (5 mg total) by mouth every 4 (four) hours as needed for severe pain. 03/25/22   Anson Oregon, PA-C  pantoprazole (PROTONIX) 40 MG tablet Take 40 mg by mouth daily.    [provider]  traMADol (ULTRAM) 50 MG tablet Take 1-2 tablets (50-100 mg total) by mouth every 4 (four) hours as needed for moderate pain. 03/25/22   Anson Oregon, PA-C  TURMERIC PO Take 1,000 mg by mouth daily.    [provider]     ALLERGIES:  Allergies  Allergen Reactions   Indomethacin Other (See Comments) and Palpitations     SOCIAL HISTORY:  Social History   Socioeconomic History   Marital status: Widowed    Spouse name: Not on file   Number of children: Not on file   Years of education: Not on file   Highest education level: Not on file  Occupational History   Not on file  Tobacco Use   Smoking status: Former    Current packs/day: 0.00    Average packs/day: 1 pack/day for 42.0 years (42.0 ttl pk-yrs)    Types: Cigarettes    Start date: 11/17/1960    Quit date: 11/18/2002    Years since quitting: 19.9   Smokeless tobacco: Never  Vaping Use   Vaping status: Never Used  Substance and Sexual Activity   Alcohol use: Never   Drug use: Never   Sexual activity: Not Currently  Other Topics Concern   Not on file  Social History Narrative   Lives alone   Social Determinants of Health   Financial Resource Strain: Low Risk  (10/09/2022)   Received from [facility]   Overall Financial Resource Strain (CARDIA)    Difficulty of Paying Living Expenses: Not hard at all  Food  Insecurity: No Food Insecurity (10/09/2022)   Received from [facility]   Hunger Vital Sign    Worried About Running Out of Food in the Last Year: Never true    Ran Out of Food in the Last Year: Never true  Transportation Needs: No Transportation Needs (10/09/2022)   Received from [facility] - Transportation    In the past 12 months, has lack of transportation kept you from medical appointments or from getting medications?: No    Lack of Transportation (Non-Medical): No  Physical Activity: Not on file  Stress: Not on file  Social Connections: Not on file  Intimate Partner Violence: Not on file     FAMILY HISTORY:  History reviewed. No pertinent family history.  REVIEW OF SYSTEMS:  Review of Systems  Constitutional:  Negative for chills and fever.  Respiratory:  Negative for cough and shortness of breath.   Cardiovascular:  Negative for chest pain and palpitations.  Gastrointestinal:  Positive for abdominal pain. Negative for blood in stool, constipation, diarrhea, nausea and vomiting.  Genitourinary:  Negative for dysuria and urgency.  All other systems reviewed and are negative.   VITAL SIGNS:  Temp:  [97 F (36.1 C)] 97 F (36.1 C) (10/01 0931) Pulse Rate:  [110] 110 (10/01 0931) Resp:  [17] 17 (10/01 0931) BP: (101)/(55) 101/55 (10/01 0931) SpO2:  [99 %] 99 % (10/01 0931) Weight:  [53.5 kg] 53.5 kg (10/01 0933)     Height: 5\' 2"  (157.5 cm) Weight: 53.5 kg BMI (Calculated): 21.57   INTAKE/OUTPUT:  No intake/output data recorded.  PHYSICAL EXAM:  Physical Exam Vitals and nursing note reviewed. Exam conducted with a chaperone present.  Constitutional:      General: She is not in acute distress.    Appearance: She is well-developed and normal weight. She is not ill-appearing.     Comments: Patient resting in bed; NAD. Family at bedside   HENT:     Head: Normocephalic and atraumatic.  Eyes:     General: No scleral  icterus.    Extraocular Movements: Extraocular movements intact.     Pupils: Pupils are equal, round, and reactive to light.  Cardiovascular:     Rate and Rhythm: Normal rate.     Heart sounds: Normal heart sounds. No murmur heard. Pulmonary:     Effort: Pulmonary effort is normal. No respiratory distress.  Abdominal:     General: A surgical scar is present. There is distension.     Palpations: Abdomen is soft.     Tenderness: There is abdominal tenderness. There is no guarding or rebound.     Hernia: No hernia is present.     Comments: Abdomen is soft, she has mild lower abdominal tenderness, she is slightly distended in lower abdomen, no rebound/guarding. She is certainly not peritonitic   Genitourinary:    Comments: Deferred Skin:    General: Skin is warm and dry.     Coloration: Skin is not jaundiced.  Neurological:     General: No focal deficit present.     Mental Status: She is alert and oriented to person, place, and time.  Psychiatric:        Mood and Affect: Mood normal.        Behavior: Behavior normal.      Labs:     Latest Ref Rng & Units 10/10/2022    9:34 AM  CBC  WBC 4.0 - 10.5 K/uL 22.2   Hemoglobin 12.0 - 15.0 g/dL 16.1   Hematocrit 09.6 - 46.0 % 32.8   Platelets 150 - 400 K/uL 498       Latest Ref Rng & Units 10/10/2022    9:34 AM 10/09/2022    4:54 PM 03/14/2022    2:32 PM  CMP  Glucose 70 - 99 mg/dL 045   95   BUN 8 - 23 mg/dL 28   42   Creatinine 4.09 - 1.00 mg/dL 8.11  9.14  7.82   Sodium 135 - 145 mmol/L 133   138   Potassium 3.5 - 5.1 mmol/L 3.7   3.8   Chloride 98 - 111 mmol/L 97   101   CO2 22 - 32 mmol/L 23   29   Calcium 8.9 - 10.3  mg/dL 8.6   9.2   Total Protein 6.5 - 8.1 g/dL 7.6   7.1   Total Bilirubin 0.3 - 1.2 mg/dL 0.7   0.7   Alkaline Phos 38 - 126 U/L 86   58   AST 15 - 41 U/L 13   29   ALT 0 - 44 U/L 14   35      Imaging studies:   CT Abdomen/Pelvis (10/09/2022) personally reviewed with change in pelvis concerning for  colocolonic and/or enterocolonic fistulas, no evidence of pneumatosis, no free air, no appreciable abscess, and radiologist report reviewed below:  IMPRESSION: Enterocolonic and colocolonic fistulas, with associated inflammatory changes involving the rectosigmoid colon and an adjacent loop of distal small bowel. No drainable fluid collection/abscess. No pneumatosis or free air.   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   Moderate to severe left hydroureteronephrosis, likely related to extrinsic compression by the inflammatory changes noted above.   Assessment/Plan:  89 y.o. 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   - Appreciate medicine admission  - I do not think she warrants any emergent surgical intervention at this time. She understands if she were to clinically deteriorate from an intra-abdominal perspective, we would need to strongly consider proceeding more urgently with resection and likely temporizing colostomy.   - Okay for diet from surgical perspective - Agree with IV Abx; Please note UTI may be secondary to colovesical fistula   - Monitor abdominal examination - Likely need repeat CT Abdomen/Pelvis in 48-72 hours pending clinical progress - Monitor leukocytosis - Mobilize - Further management per primary service; we will follow   All of the above findings and recommendations were discussed with the patient and her family, and all of their questions were answered to their expressed satisfaction.  Thank you for the opportunity to participate in this patient's care.   -- Lynden Oxford, PA-C [facility] 10/10/2022, 10:34 AM M-F: 7am - 4pm p

## 2022-10-11 ENCOUNTER — Inpatient Hospital Stay: Payer: Medicare Other | Admitting: Radiology

## 2022-10-11 DIAGNOSIS — Z8619 Personal history of other infectious and parasitic diseases: Secondary | ICD-10-CM | POA: Diagnosis not present

## 2022-10-11 DIAGNOSIS — B961 Klebsiella pneumoniae [K. pneumoniae] as the cause of diseases classified elsewhere: Secondary | ICD-10-CM | POA: Diagnosis present

## 2022-10-11 DIAGNOSIS — D649 Anemia, unspecified: Secondary | ICD-10-CM | POA: Diagnosis not present

## 2022-10-11 DIAGNOSIS — N179 Acute kidney failure, unspecified: Secondary | ICD-10-CM | POA: Diagnosis present

## 2022-10-11 DIAGNOSIS — N1330 Unspecified hydronephrosis: Secondary | ICD-10-CM | POA: Diagnosis not present

## 2022-10-11 DIAGNOSIS — Z85850 Personal history of malignant neoplasm of thyroid: Secondary | ICD-10-CM | POA: Diagnosis not present

## 2022-10-11 DIAGNOSIS — E890 Postprocedural hypothyroidism: Secondary | ICD-10-CM | POA: Diagnosis present

## 2022-10-11 DIAGNOSIS — K5792 Diverticulitis of intestine, part unspecified, without perforation or abscess without bleeding: Secondary | ICD-10-CM | POA: Diagnosis not present

## 2022-10-11 DIAGNOSIS — K632 Fistula of intestine: Secondary | ICD-10-CM | POA: Diagnosis not present

## 2022-10-11 DIAGNOSIS — A419 Sepsis, unspecified organism: Secondary | ICD-10-CM | POA: Diagnosis not present

## 2022-10-11 DIAGNOSIS — Z87891 Personal history of nicotine dependence: Secondary | ICD-10-CM | POA: Diagnosis not present

## 2022-10-11 DIAGNOSIS — N136 Pyonephrosis: Secondary | ICD-10-CM | POA: Diagnosis present

## 2022-10-11 DIAGNOSIS — M1990 Unspecified osteoarthritis, unspecified site: Secondary | ICD-10-CM | POA: Diagnosis present

## 2022-10-11 DIAGNOSIS — E876 Hypokalemia: Secondary | ICD-10-CM | POA: Diagnosis not present

## 2022-10-11 DIAGNOSIS — K5740 Diverticulitis of both small and large intestine with perforation and abscess without bleeding: Secondary | ICD-10-CM | POA: Diagnosis present

## 2022-10-11 DIAGNOSIS — A4159 Other Gram-negative sepsis: Secondary | ICD-10-CM | POA: Diagnosis present

## 2022-10-11 DIAGNOSIS — D638 Anemia in other chronic diseases classified elsewhere: Secondary | ICD-10-CM | POA: Diagnosis present

## 2022-10-11 DIAGNOSIS — I10 Essential (primary) hypertension: Secondary | ICD-10-CM | POA: Diagnosis present

## 2022-10-11 DIAGNOSIS — K5732 Diverticulitis of large intestine without perforation or abscess without bleeding: Secondary | ICD-10-CM | POA: Diagnosis not present

## 2022-10-11 DIAGNOSIS — K219 Gastro-esophageal reflux disease without esophagitis: Secondary | ICD-10-CM | POA: Diagnosis present

## 2022-10-11 DIAGNOSIS — R109 Unspecified abdominal pain: Secondary | ICD-10-CM | POA: Diagnosis present

## 2022-10-11 DIAGNOSIS — N3090 Cystitis, unspecified without hematuria: Secondary | ICD-10-CM | POA: Diagnosis not present

## 2022-10-11 DIAGNOSIS — Z905 Acquired absence of kidney: Secondary | ICD-10-CM | POA: Diagnosis not present

## 2022-10-11 DIAGNOSIS — N321 Vesicointestinal fistula: Secondary | ICD-10-CM | POA: Diagnosis present

## 2022-10-11 DIAGNOSIS — Z79890 Hormone replacement therapy (postmenopausal): Secondary | ICD-10-CM | POA: Diagnosis not present

## 2022-10-11 DIAGNOSIS — Z23 Encounter for immunization: Secondary | ICD-10-CM | POA: Diagnosis present

## 2022-10-11 DIAGNOSIS — E871 Hypo-osmolality and hyponatremia: Secondary | ICD-10-CM | POA: Diagnosis present

## 2022-10-11 DIAGNOSIS — R6520 Severe sepsis without septic shock: Secondary | ICD-10-CM | POA: Diagnosis not present

## 2022-10-11 DIAGNOSIS — Z96652 Presence of left artificial knee joint: Secondary | ICD-10-CM | POA: Diagnosis present

## 2022-10-11 DIAGNOSIS — D75839 Thrombocytosis, unspecified: Secondary | ICD-10-CM | POA: Diagnosis present

## 2022-10-11 DIAGNOSIS — K429 Umbilical hernia without obstruction or gangrene: Secondary | ICD-10-CM | POA: Diagnosis present

## 2022-10-11 HISTORY — PX: IR NEPHROSTOMY PLACEMENT LEFT: IMG6063

## 2022-10-11 LAB — BASIC METABOLIC PANEL
Anion gap: 11 (ref 5–15)
BUN: 24 mg/dL — ABNORMAL HIGH (ref 8–23)
CO2: 21 mmol/L — ABNORMAL LOW (ref 22–32)
Calcium: 7.9 mg/dL — ABNORMAL LOW (ref 8.9–10.3)
Chloride: 106 mmol/L (ref 98–111)
Creatinine, Ser: 1.08 mg/dL — ABNORMAL HIGH (ref 0.44–1.00)
GFR, Estimated: 50 mL/min — ABNORMAL LOW (ref >60)
Glucose, Bld: 87 mg/dL (ref 70–99)
Potassium: 3.1 mmol/L — ABNORMAL LOW (ref 3.5–5.1)
Sodium: 138 mmol/L (ref 135–145)

## 2022-10-11 LAB — CBC
HCT: 30.7 % — ABNORMAL LOW (ref 36.0–46.0)
Hemoglobin: 10 g/dL — ABNORMAL LOW (ref 12.0–15.0)
MCH: 28 pg (ref 26.0–34.0)
MCHC: 32.6 g/dL (ref 30.0–36.0)
MCV: 86 fL (ref 80.0–100.0)
Platelets: 377 10*3/uL (ref 150–400)
RBC: 3.57 MIL/uL — ABNORMAL LOW (ref 3.87–5.11)
RDW: 14.6 % (ref 11.5–15.5)
WBC: 12.2 10*3/uL — ABNORMAL HIGH (ref 4.0–10.5)
nRBC: 0 % (ref 0.0–0.2)

## 2022-10-11 LAB — PROTIME-INR
INR: 1.3 — ABNORMAL HIGH (ref 0.8–1.2)
Prothrombin Time: 16 s — ABNORMAL HIGH (ref 11.4–15.2)

## 2022-10-11 LAB — PROCALCITONIN: Procalcitonin: 0.44 ng/mL

## 2022-10-11 MED ORDER — FENTANYL CITRATE (PF) 100 MCG/2ML IJ SOLN
INTRAMUSCULAR | Status: AC
  Filled 2022-10-11: qty 2

## 2022-10-11 MED ORDER — LEVOTHYROXINE SODIUM 88 MCG PO TABS
88.0000 ug | ORAL_TABLET | ORAL | Status: DC
  Administered 2022-10-12 – 2022-10-17 (×5): 88 ug via ORAL
  Filled 2022-10-11 (×5): qty 1

## 2022-10-11 MED ORDER — PEG 3350-KCL-NA BICARB-NACL 420 G PO SOLR
4000.0000 mL | Freq: Once | ORAL | Status: AC
  Administered 2022-10-12: 4000 mL via ORAL
  Filled 2022-10-11: qty 4000

## 2022-10-11 MED ORDER — LIDOCAINE HCL 1 % IJ SOLN
INTRAMUSCULAR | Status: AC
  Filled 2022-10-11: qty 20

## 2022-10-11 MED ORDER — ENOXAPARIN SODIUM 30 MG/0.3ML IJ SOSY
30.0000 mg | PREFILLED_SYRINGE | INTRAMUSCULAR | Status: DC
  Administered 2022-10-12 – 2022-10-15 (×4): 30 mg via SUBCUTANEOUS
  Filled 2022-10-11 (×4): qty 0.3

## 2022-10-11 MED ORDER — MIDAZOLAM HCL 2 MG/2ML IJ SOLN
INTRAMUSCULAR | Status: AC
  Filled 2022-10-11: qty 4

## 2022-10-11 MED ORDER — POTASSIUM CHLORIDE CRYS ER 20 MEQ PO TBCR
40.0000 meq | EXTENDED_RELEASE_TABLET | Freq: Once | ORAL | Status: AC
  Administered 2022-10-11: 40 meq via ORAL
  Filled 2022-10-11: qty 2

## 2022-10-11 MED ORDER — PIPERACILLIN-TAZOBACTAM 3.375 G IVPB
3.3750 g | Freq: Three times a day (TID) | INTRAVENOUS | Status: AC
  Administered 2022-10-11 – 2022-10-16 (×16): 3.375 g via INTRAVENOUS
  Filled 2022-10-11 (×15): qty 50

## 2022-10-11 MED ORDER — LIDOCAINE HCL 1 % IJ SOLN
20.0000 mL | Freq: Once | INTRAMUSCULAR | Status: AC
  Administered 2022-10-11: 20 mL via INTRADERMAL
  Filled 2022-10-11: qty 20

## 2022-10-11 MED ORDER — FENTANYL CITRATE (PF) 100 MCG/2ML IJ SOLN
INTRAMUSCULAR | Status: AC | PRN
Start: 2022-10-11 — End: 2022-10-11
  Administered 2022-10-11: 25 ug via INTRAVENOUS

## 2022-10-11 MED ORDER — SODIUM CHLORIDE 0.9 % IV SOLN: INTRAVENOUS | Status: DC

## 2022-10-11 MED ORDER — NEOMYCIN SULFATE 500 MG PO TABS
1000.0000 mg | ORAL_TABLET | Freq: Four times a day (QID) | ORAL | Status: DC
  Administered 2022-10-11 – 2022-10-12 (×3): 1000 mg via ORAL
  Filled 2022-10-11 (×4): qty 2

## 2022-10-11 MED ORDER — LEVOTHYROXINE SODIUM 88 MCG PO TABS
44.0000 ug | ORAL_TABLET | ORAL | Status: DC
  Administered 2022-10-14: 44 ug via ORAL
  Filled 2022-10-11: qty 0.5

## 2022-10-11 MED ORDER — IOHEXOL 300 MG/ML  SOLN
30.0000 mL | Freq: Once | INTRAMUSCULAR | Status: AC | PRN
  Administered 2022-10-11: 30 mL

## 2022-10-11 MED ORDER — MIDAZOLAM HCL 2 MG/2ML IJ SOLN
INTRAMUSCULAR | Status: AC | PRN
Start: 2022-10-11 — End: 2022-10-11
  Administered 2022-10-11: .5 mg via INTRAVENOUS

## 2022-10-11 NOTE — Consult Note (Signed)
Urology Consult  I have been asked to see the patient by Bishop Limbo, NP , for evaluation and management of left hydronephrosis.  Chief Complaint: Abdominal pain   History of Present Illness: [name] is a 89 y.o. year old female past medical history significant for hypothyroidism, thyroid cancer status post partial thyroidectomy, essential hypertension, possible C. difficile colitis and GERD who presented to the emergency department after CT scan ordered by her primary care physician noted enterocolonic and colocolonic fistula as with associated laboratory changes, cystitis with suspected colovesical fistula and moderate to severe left hydronephrosis.  She developed diarrhea in June of this year.  She had been treated for possible C. difficile, but she did not improve.  Contrast CT performed on October 09, 2022 identified the enterocolonic and colocolonic fistula as associated with inflammatory changes with a moderate to severe left hydronephrosis likely secondary to the inflammatory changes caused by the fistulous.  She then presented to the ED at the direction of her PCP.    She denied any prior history of diverticulitis.  She also denied any issues with her bladder, history of kidney stones or having to see a urologist prior to this admission.  This morning, she states she has minimal abdominal pain.  She is still having urinary urgency with mucousy dark urine.    VSS afebrile.    WBC count down from 22.2 yesterday to 12.2 this morning.  Serum creatinine improving from 1.5 on admission to 1.08 this morning.   Past Medical History:  Diagnosis Date   Arthritis    Asthma    as a child   Cancer (HCC)    GERD (gastroesophageal reflux disease)    Hypothyroidism    Pneumonia 1971   Thyroid nodule     Past Surgical History:  Procedure Laterality Date   ABDOMINAL HYSTERECTOMY     CATARACT EXTRACTION, BILATERAL     KNEE ARTHROPLASTY Left 03/24/2022   Procedure: COMPUTER  ASSISTED TOTAL KNEE ARTHROPLASTY;  Surgeon: Donato Heinz, MD;  Location: [facility] ORS;  Service: Orthopedics;  Laterality: Left;   thyroid gland removed  1977   THYROIDECTOMY, PARTIAL Left     Home Medications:  No current facility-administered medications on file prior to encounter.   Current Outpatient Medications on File Prior to Encounter  Medication Sig Dispense Refill   amoxicillin (AMOXIL) 500 MG tablet Take 2,000 mg by mouth once.     amoxicillin-clavulanate (AUGMENTIN) 875-125 MG tablet Take 1 tablet by mouth 2 (two) times daily.     levothyroxine (SYNTHROID) 88 MCG tablet Take 88 mcg by mouth daily before breakfast.     mirtazapine (REMERON) 7.5 MG tablet Take 7.5 mg by mouth at bedtime.     predniSONE (DELTASONE) 10 MG tablet Take 10 mg by mouth daily with breakfast.     sucralfate (CARAFATE) 1 g tablet Take 1 g by mouth 2 (two) times daily.     acetaminophen (TYLENOL) 500 MG tablet Take 2 tablets (1,000 mg total) by mouth every 6 (six) hours. 30 tablet 0   APPLE CIDER VINEGAR PO Take 1 tablet by mouth daily. 1 gummie daily     ascorbic acid (VITAMIN C) 1000 MG tablet Take 1,000 mg by mouth daily.      aspirin-acetaminophen-caffeine (EXCEDRIN MIGRAINE) 250-250-65 MG tablet Take 1 tablet by mouth every 6 (six) hours as needed for headache.     Calcium Carbonate-Vitamin D 600-200 MG-UNIT CAPS Take 1,200 mg by mouth daily. 30 capsule 0  CALCIUM CITRATE PO Take 2 tablets by mouth daily. 600 mg     celecoxib (CELEBREX) 200 MG capsule Take 1 capsule (200 mg total) by mouth 2 (two) times daily. 60 capsule 0   Cyanocobalamin 5000 MCG TBDP Take 5,000 mcg by mouth daily.      diclofenac Sodium (VOLTAREN ARTHRITIS PAIN) 1 % GEL Apply 2 g topically as needed.     enoxaparin (LOVENOX) 40 MG/0.4ML injection Inject 0.4 mLs (40 mg total) into the skin daily. 5.6 mL 0   folic acid (FOLVITE) 1 MG tablet Take 1 mg by mouth daily.     hydrochlorothiazide (HYDRODIURIL) 25 MG tablet Take 25 mg by  mouth daily.      levothyroxine (SYNTHROID) 75 MCG tablet Take 1 tablet (75 mcg total) by mouth daily at 6 (six) AM. 30 tablet 6   methotrexate (RHEUMATREX) 2.5 MG tablet Take 15 mg by mouth every Thursday. 7.5 mg once a week     Multiple Vitamin (MULTI-VITAMIN) tablet Take 1 tablet by mouth daily. Centrum     ondansetron (ZOFRAN) 4 MG tablet Take 1 tablet (4 mg total) by mouth every 6 (six) hours as needed for nausea. 30 tablet 0   OVER THE COUNTER MEDICATION 1 Scoop daily. Multi collagen protein powder 1 scoop daily     oxyCODONE (OXY IR/ROXICODONE) 5 MG immediate release tablet Take 1 tablet (5 mg total) by mouth every 4 (four) hours as needed for severe pain. 30 tablet 0   pantoprazole (PROTONIX) 40 MG tablet Take 40 mg by mouth daily.     traMADol (ULTRAM) 50 MG tablet Take 1-2 tablets (50-100 mg total) by mouth every 4 (four) hours as needed for moderate pain. 30 tablet 0   TURMERIC PO Take 1,000 mg by mouth daily.       Allergies:  Allergies  Allergen Reactions   Indomethacin Other (See Comments) and Palpitations    History reviewed. No pertinent family history.  Social History:  reports that she quit smoking about 19 years ago. Her smoking use included cigarettes. She started smoking about 61 years ago. She has a 42 pack-year smoking history. She has never used smokeless tobacco. She reports that she does not drink alcohol and does not use drugs.  ROS: A complete review of systems was performed.  All systems are negative except for pertinent findings as noted.  Physical Exam:  Vital signs in last 24 hours: Temp:  [97 F (36.1 C)-98.9 F (37.2 C)] 98.9 F (37.2 C) (10/02 0346) Pulse Rate:  [42-110] 88 (10/02 0346) Resp:  [17-19] 18 (10/02 0346) BP: (101-132)/(55-95) 109/57 (10/02 0346) SpO2:  [98 %-100 %] 98 % (10/02 0346) Weight:  [53.5 kg] 53.5 kg (10/01 0933) Constitutional:  Well nourished. Alert and oriented, No acute distress. HEENT: [place] AT, moist mucus membranes.   Trachea midline Cardiovascular: No clubbing, cyanosis, or edema. Respiratory: Normal respiratory effort, no increased work of breathing. GI: Abdomen is soft, non tender, non distended, no abdominal masses. GU: No CVA tenderness.  No bladder fullness or masses.   Neurologic: Grossly intact, no focal deficits, moving all 4 extremities. Psychiatric: Normal mood and affect.   Laboratory Data:  Recent Labs    10/10/22 0934 10/11/22 0511  WBC 22.2* 12.2*  HGB 10.8* 10.0*  HCT 32.8* 30.7*   Recent Labs    10/09/22 1654 10/10/22 0934 10/11/22 0511  NA  --  133* 138  K  --  3.7 3.1*  CL  --  97* 106  CO2  --  23 21*  GLUCOSE  --  119* 87  BUN  --  28* 24*  CREATININE 1.50* 1.48* 1.08*  CALCIUM  --  8.6* 7.9*   Recent Labs    10/11/22 0511  INR 1.3*   No results for input(s): "LABURIN" in the last 72 hours. Results for orders placed or performed during the hospital encounter of 03/24/22  Urine Culture Add-On     Status: Abnormal   Collection Time: 03/14/22  2:30 PM   Specimen: Urine, Clean Catch  Result Value Ref Range Status   Specimen Description   Final    URINE, CLEAN CATCH Performed at [facility], [address], [place], [place] [address]    Special Requests   Final    NONE Performed at [facility], [address] [place], [place] [address]    Culture (A)  Final    50,000 COLONIES/mL ESCHERICHIA COLI 30,000 COLONIES/mL ESCHERICHIA COLI    Report Status 03/17/2022 FINAL  Final   Organism ID, Bacteria ESCHERICHIA COLI (A)  Final   Organism ID, Bacteria ESCHERICHIA COLI (A)  Final      Susceptibility   Escherichia coli - MIC*    AMPICILLIN 4 SENSITIVE Sensitive     CEFAZOLIN <=4 SENSITIVE Sensitive     CEFEPIME <=0.12 SENSITIVE Sensitive     CEFTRIAXONE <=0.25 SENSITIVE Sensitive     CIPROFLOXACIN <=0.25 SENSITIVE Sensitive     GENTAMICIN <=1 SENSITIVE Sensitive     IMIPENEM <=0.25 SENSITIVE Sensitive     NITROFURANTOIN <=16 SENSITIVE  Sensitive     TRIMETH/SULFA <=20 SENSITIVE Sensitive     AMPICILLIN/SULBACTAM <=2 SENSITIVE Sensitive     PIP/TAZO <=4 SENSITIVE Sensitive    Escherichia coli - MIC*    AMPICILLIN 4 SENSITIVE Sensitive     CEFAZOLIN <=4 SENSITIVE Sensitive     CEFEPIME <=0.12 SENSITIVE Sensitive     CEFTRIAXONE <=0.25 SENSITIVE Sensitive     CIPROFLOXACIN <=0.25 SENSITIVE Sensitive     GENTAMICIN <=1 SENSITIVE Sensitive     IMIPENEM <=0.25 SENSITIVE Sensitive     NITROFURANTOIN <=16 SENSITIVE Sensitive     TRIMETH/SULFA >=320 RESISTANT Resistant     AMPICILLIN/SULBACTAM <=2 SENSITIVE Sensitive     PIP/TAZO <=4 SENSITIVE Sensitive     * 50,000 COLONIES/mL ESCHERICHIA COLI    30,000 COLONIES/mL ESCHERICHIA COLI     Radiologic Imaging: CT ABDOMEN PELVIS W WO CONTRAST  Result Date: 10/09/2022 CLINICAL DATA:  Abdominal pain, weight loss, C diff colitis status post vancomycin EXAM: CT ABDOMEN AND PELVIS WITHOUT AND WITH CONTRAST TECHNIQUE: Multidetector CT imaging of the abdomen and pelvis was performed following the standard protocol before and following the bolus administration of intravenous contrast. RADIATION DOSE REDUCTION: This exam was performed according to the departmental dose-optimization program which includes automated exposure control, adjustment of the mA and/or kV according to patient size and/or use of iterative reconstruction technique. CONTRAST:  65mL OMNIPAQUE IOHEXOL 300 MG/ML  SOLN COMPARISON:  None Available. FINDINGS: Lower chest: Lung bases are clear. Hepatobiliary: Liver is within normal limits. Layering tiny gallstones (series 7/image 80), without associated inflammatory changes. No intrahepatic or extrahepatic ductal dilatation. Pancreas: Within normal limits. Spleen: Within normal limits. Adrenals/Urinary Tract: Adrenal glands are within normal limits. Subcentimeter posterior right upper pole renal cyst (series 2/image 17), benign (Bosniak I). Additional dominant 3.1 cm left lower pole  renal cyst (series 12/image 30), benign (Bosniak I). No follow-up is recommended. No renal, ureteral, or bladder calculi. Moderate to severe  left hydroureteronephrosis. Narrowing/extrinsic compression of the mid left ureter or (series 7/image 4), likely related to inflammatory changes (described below). Thick-walled bladder with mucosal hyperenhancement and nondependent gas (series 7, image 67), likely reflecting cystitis, favored to be secondary to a colovesical fistula (series 11/image 49). Stomach/Bowel: Stomach is notable for a moderate hiatal hernia. No evidence of bowel obstruction. Appendix is not discretely visualized. Wall thickening involving mid/distal ileum in the central lower abdomen (series 7/image 2), with adjacent enterocolonic (series 7/image 5) and colocolonic fistula (series 7/image 61). Adjacent wall thickening involving the rectosigmoid colon (series 7/image 60). No drainable fluid collection/abscess. No free air. Vascular/Lymphatic: No evidence of abdominal aortic aneurysm. Atherosclerotic calcifications of the abdominal aorta and branch vessels, although vessels remain patent. No suspicious abdominopelvic lymphadenopathy. Reproductive: Status post hysterectomy. Right ovary is within normal limits. Left ovary is notable for a dominant 2.8 cm cyst/follicle (series 7, image 88). Other: No abdominopelvic ascites. Musculoskeletal: Degenerative changes of the visualized thoracolumbar spine. IMPRESSION: Enterocolonic and colocolonic fistulas, with associated inflammatory changes involving the rectosigmoid colon and an adjacent loop of distal small bowel. No drainable fluid collection/abscess. No pneumatosis or free air. 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 Moderate to severe left hydroureteronephrosis, likely related to extrinsic compression by the inflammatory changes noted above. Additional ancillary findings as above. These results will be called to the ordering clinician or  representative by the Radiologist Assistant, and communication documented in the PACS or [employer]. Electronically Signed   By: Charline Bills M.D.   On: 10/09/2022 20:13    Impression/Assessment:  89 year old female admitted for abdominal pain with diarrhea secondary to entero colonic and colocolonic fistulas and was also found to have left hydronephrosis on admission CT.    Plan:  -keep patient NPO -feel that she would benefit with a left nephrostomy tube placement to preserve renal function as it would be difficult to place ureteral stent in the setting of the inflammation caused by the fistulas and also put her at risk for upper tract infection -discussed case with interventional radiologist for left nephrostomy tube placement and they will consult the patient sometime today  -Agree with broad-spectrum antibiotics until urine cultures are available  10/11/2022, 7:59 AM  Maya Arcand, PA-C

## 2022-10-11 NOTE — Plan of Care (Signed)
  Problem: Clinical Measurements: Goal: Postoperative complications will be avoided or minimized Outcome: Progressing   Problem: Pain Management: Goal: Pain level will decrease with appropriate interventions Outcome: Progressing   Problem: Activity: Goal: Ability to avoid complications of mobility impairment will improve Outcome: Progressing Goal: Range of joint motion will improve Outcome: Progressing

## 2022-10-11 NOTE — Consult Note (Signed)
Chief Complaint: Patient was seen in consultation today for left hydronephrosis.   Referring Physician(s): Michiel Cowboy, PA-C  Supervising Physician: Pernell Dupre  Patient Status: [facility] - In-pt  History of Present Illness: [name] is a 89 y.o. female with PMH significant for asthma, GERD, thyroid cancer, and hypertension being seen today for left hydronephrosis. 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 Patient was also found to have moderate to severe left hydronephrosis. IR was consulted to evaluate patient for PCN placement.  Past Medical History:  Diagnosis Date   Arthritis    Asthma    as a child   Cancer (HCC)    GERD (gastroesophageal reflux disease)    Hypothyroidism    Pneumonia 1971   Thyroid nodule     Past Surgical History:  Procedure Laterality Date   ABDOMINAL HYSTERECTOMY     CATARACT EXTRACTION, BILATERAL     KNEE ARTHROPLASTY Left 03/24/2022   Procedure: COMPUTER ASSISTED TOTAL KNEE ARTHROPLASTY;  Surgeon: Donato Heinz, MD;  Location: [facility] ORS;  Service: Orthopedics;  Laterality: Left;   thyroid gland removed  1977   THYROIDECTOMY, PARTIAL Left     Allergies: Indomethacin  Medications: Prior to Admission medications   Medication Sig Start Date End Date Taking? Authorizing Provider  amoxicillin (AMOXIL) 500 MG tablet Take 2,000 mg by mouth once. 09/28/22  Yes [provider]  amoxicillin-clavulanate (AUGMENTIN) 875-125 MG tablet Take 1 tablet by mouth 2 (two) times daily. 10/09/22  Yes [provider]  levothyroxine (SYNTHROID) 88 MCG tablet Take 88 mcg by mouth daily before breakfast. 08/14/22  Yes [provider]  mirtazapine (REMERON) 7.5 MG tablet Take 7.5 mg by mouth at bedtime. 09/05/22  Yes [provider]  predniSONE (DELTASONE) 10 MG tablet Take 10 mg by mouth daily with breakfast. 10/09/22 10/23/22 Yes [provider]  sucralfate  (CARAFATE) 1 g tablet Take 1 g by mouth 2 (two) times daily. 06/15/22  Yes [provider]  acetaminophen (TYLENOL) 500 MG tablet Take 2 tablets (1,000 mg total) by mouth every 6 (six) hours. 11/29/19   Duanne Guess, MD  APPLE CIDER VINEGAR PO Take 1 tablet by mouth daily. 1 gummie daily    [provider]  ascorbic acid (VITAMIN C) 1000 MG tablet Take 1,000 mg by mouth daily.     [provider]  aspirin-acetaminophen-caffeine (EXCEDRIN MIGRAINE) [id number] MG tablet Take 1 tablet by mouth every 6 (six) hours as needed for headache.    [provider]  Calcium Carbonate-Vitamin D 600-200 MG-UNIT CAPS Take 1,200 mg by mouth daily. 11/29/19   Duanne Guess, MD  CALCIUM CITRATE PO Take 2 tablets by mouth daily. 600 mg    [provider]  celecoxib (CELEBREX) 200 MG capsule Take 1 capsule (200 mg total) by mouth 2 (two) times daily. 03/25/22   Anson Oregon, PA-C  Cyanocobalamin 5000 MCG TBDP Take 5,000 mcg by mouth daily.     [provider]  diclofenac Sodium (VOLTAREN ARTHRITIS PAIN) 1 % GEL Apply 2 g topically as needed.    [provider]  enoxaparin (LOVENOX) 40 MG/0.4ML injection Inject 0.4 mLs (40 mg total) into the skin daily. 03/25/22   Anson Oregon, PA-C  folic acid (FOLVITE) 1 MG tablet Take 1 mg by mouth daily.    [provider]  hydrochlorothiazide (HYDRODIURIL) 25 MG tablet Take 25 mg by mouth daily.  08/01/19   [provider]  levothyroxine (  SYNTHROID) 75 MCG tablet Take 1 tablet (75 mcg total) by mouth daily at 6 (six) AM. 11/30/19   Duanne Guess, MD  methotrexate (RHEUMATREX) 2.5 MG tablet Take 15 mg by mouth every Thursday. 7.5 mg once a week 10/16/19   [provider]  Multiple Vitamin (MULTI-VITAMIN) tablet Take 1 tablet by mouth daily. Centrum    [provider]  ondansetron (ZOFRAN) 4 MG tablet Take 1 tablet (4 mg total) by mouth every 6 (six) hours as needed for  nausea. 03/25/22   Anson Oregon, PA-C  OVER THE COUNTER MEDICATION 1 Scoop daily. Multi collagen protein powder 1 scoop daily    [provider]  oxyCODONE (OXY IR/ROXICODONE) 5 MG immediate release tablet Take 1 tablet (5 mg total) by mouth every 4 (four) hours as needed for severe pain. 03/25/22   Anson Oregon, PA-C  pantoprazole (PROTONIX) 40 MG tablet Take 40 mg by mouth daily.    [provider]  traMADol (ULTRAM) 50 MG tablet Take 1-2 tablets (50-100 mg total) by mouth every 4 (four) hours as needed for moderate pain. 03/25/22   Anson Oregon, PA-C  TURMERIC PO Take 1,000 mg by mouth daily.    [provider]     History reviewed. No pertinent family history.  Social History   Socioeconomic History   Marital status: Widowed    Spouse name: Not on file   Number of children: Not on file   Years of education: Not on file   Highest education level: Not on file  Occupational History   Not on file  Tobacco Use   Smoking status: Former    Current packs/day: 0.00    Average packs/day: 1 pack/day for 42.0 years (42.0 ttl pk-yrs)    Types: Cigarettes    Start date: 11/17/1960    Quit date: 11/18/2002    Years since quitting: 19.9   Smokeless tobacco: Never  Vaping Use   Vaping status: Never Used  Substance and Sexual Activity   Alcohol use: Never   Drug use: Never   Sexual activity: Not Currently  Other Topics Concern   Not on file  Social History Narrative   Lives alone   Social Determinants of Health   Financial Resource Strain: Low Risk  (10/09/2022)   Received from [facility]   Overall Financial Resource Strain (CARDIA)    Difficulty of Paying Living Expenses: Not hard at all  Food Insecurity: No Food Insecurity (10/10/2022)   Hunger Vital Sign    Worried About Running Out of Food in the Last Year: Never true    Ran Out of Food in the Last Year: Never true  Transportation Needs: No Transportation Needs  (10/10/2022)   PRAPARE - Administrator, Civil Service (Medical): No    Lack of Transportation (Non-Medical): No  Physical Activity: Not on file  Stress: Not on file  Social Connections: Not on file    Code Status: Full code  Review of Systems: A 12 point ROS discussed and pertinent positives are indicated in the HPI above.  All other systems are negative.  Review of Systems  Constitutional:  Negative for chills and fever.  Respiratory:  Negative for chest tightness and shortness of breath.   Cardiovascular:  Positive for leg swelling. Negative for chest pain.  Gastrointestinal:  Positive for diarrhea. Negative for abdominal pain, nausea and vomiting.  Neurological:  Positive for headaches. Negative for dizziness.  Psychiatric/Behavioral:  Negative for confusion.  Vital Signs: BP 136/79 (BP Location: Left Arm)   Pulse 88   Temp 98.9 F (37.2 C) (Oral)   Resp 18   Ht 5\' 2"  (1.575 m)   Wt 117 lb 15.1 oz (53.5 kg)   SpO2 100%   BMI 21.57 kg/m    Physical Exam Vitals reviewed.  Constitutional:      General: She is not in acute distress.    Appearance: She is ill-appearing.  Cardiovascular:     Rate and Rhythm: Normal rate and regular rhythm.     Pulses: Normal pulses.     Heart sounds: Normal heart sounds.  Pulmonary:     Effort: Pulmonary effort is normal.     Breath sounds: Normal breath sounds.  Abdominal:     Palpations: Abdomen is soft.     Tenderness: There is no abdominal tenderness.  Musculoskeletal:     Right lower leg: Edema present.     Left lower leg: Edema present.     Comments: 1+ pitting edema bilaterally in lower extremities  Skin:    General: Skin is warm and dry.  Neurological:     Mental Status: She is alert and oriented to person, place, and time.  Psychiatric:        Mood and Affect: Mood normal.        Behavior: Behavior normal.        Thought Content: Thought content normal.        Judgment: Judgment normal.      Imaging: CT ABDOMEN PELVIS W WO CONTRAST  Result Date: 10/09/2022 CLINICAL DATA:  Abdominal pain, weight loss, C diff colitis status post vancomycin EXAM: CT ABDOMEN AND PELVIS WITHOUT AND WITH CONTRAST TECHNIQUE: Multidetector CT imaging of the abdomen and pelvis was performed following the standard protocol before and following the bolus administration of intravenous contrast. RADIATION DOSE REDUCTION: This exam was performed according to the departmental dose-optimization program which includes automated exposure control, adjustment of the mA and/or kV according to patient size and/or use of iterative reconstruction technique. CONTRAST:  65mL OMNIPAQUE IOHEXOL 300 MG/ML  SOLN COMPARISON:  None Available. FINDINGS: Lower chest: Lung bases are clear. Hepatobiliary: Liver is within normal limits. Layering tiny gallstones (series 7/image 80), without associated inflammatory changes. No intrahepatic or extrahepatic ductal dilatation. Pancreas: Within normal limits. Spleen: Within normal limits. Adrenals/Urinary Tract: Adrenal glands are within normal limits. Subcentimeter posterior right upper pole renal cyst (series 2/image 17), benign (Bosniak I). Additional dominant 3.1 cm left lower pole renal cyst (series 12/image 30), benign (Bosniak I). No follow-up is recommended. No renal, ureteral, or bladder calculi. Moderate to severe left hydroureteronephrosis. Narrowing/extrinsic compression of the mid left ureter or (series 7/image 4), likely related to inflammatory changes (described below). Thick-walled bladder with mucosal hyperenhancement and nondependent gas (series 7, image 67), likely reflecting cystitis, favored to be secondary to a colovesical fistula (series 11/image 49). Stomach/Bowel: Stomach is notable for a moderate hiatal hernia. No evidence of bowel obstruction. Appendix is not discretely visualized. Wall thickening involving mid/distal ileum in the central lower abdomen (series 7/image 2),  with adjacent enterocolonic (series 7/image 5) and colocolonic fistula (series 7/image 61). Adjacent wall thickening involving the rectosigmoid colon (series 7/image 60). No drainable fluid collection/abscess. No free air. Vascular/Lymphatic: No evidence of abdominal aortic aneurysm. Atherosclerotic calcifications of the abdominal aorta and branch vessels, although vessels remain patent. No suspicious abdominopelvic lymphadenopathy. Reproductive: Status post hysterectomy. Right ovary is within normal limits. Left ovary is notable for a dominant 2.8 cm  cyst/follicle (series 7, image 88). Other: No abdominopelvic ascites. Musculoskeletal: Degenerative changes of the visualized thoracolumbar spine. IMPRESSION: Enterocolonic and colocolonic fistulas, with associated inflammatory changes involving the rectosigmoid colon and an adjacent loop of distal small bowel. No drainable fluid collection/abscess. No pneumatosis or free air. 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 Moderate to severe left hydroureteronephrosis, likely related to extrinsic compression by the inflammatory changes noted above. Additional ancillary findings as above. These results will be called to the ordering clinician or representative by the Radiologist Assistant, and communication documented in the PACS or [employer]. Electronically Signed   By: Charline Bills M.D.   On: 10/09/2022 20:13    Labs:  CBC: Recent Labs    10/10/22 0934 10/11/22 0511  WBC 22.2* 12.2*  HGB 10.8* 10.0*  HCT 32.8* 30.7*  PLT 498* 377    COAGS: Recent Labs    10/11/22 0511  INR 1.3*    BMP: Recent Labs    03/14/22 1432 10/09/22 1654 10/10/22 0934 10/11/22 0511  NA 138  --  133* 138  K 3.8  --  3.7 3.1*  CL 101  --  97* 106  CO2 29  --  23 21*  GLUCOSE 95  --  119* 87  BUN 42*  --  28* 24*  CALCIUM 9.2  --  8.6* 7.9*  CREATININE 1.16* 1.50* 1.48* 1.08*  GFRNONAA 46*  --  34* 50*    LIVER FUNCTION TESTS: Recent Labs     03/14/22 1432 10/10/22 0934  BILITOT 0.7 0.7  AST 29 13*  ALT 35 14  ALKPHOS 58 86  PROT 7.1 7.6  ALBUMIN 3.9 2.7*    TUMOR MARKERS: No results for input(s): "AFPTM", "CEA", "CA199", "CHROMGRNA" in the last 8760 hours.  Assessment and Plan:  [name] is an 89 yo female being seen today in relation to left hydronephrosis. Patient is being followed by Urology service who have requested image-guided left percutaneous nephrostomy tube placement for patient's hydronephrosis. Case reviewed and approved by Dr Juliette Alcide for left PCN placement tentatively for 10/11/22. Patient is NPO, INR of 1.3 this AM. WBC of 12.2, Hgb 10.0.  Risks and benefits of left PCN placement was discussed with the patient including, but not limited to, infection, bleeding, significant bleeding causing loss or decrease in renal function or damage to adjacent structures.   All of the patient's questions were answered, patient is agreeable to proceed.  Consent signed and in chart.   Thank you for this interesting consult.  I greatly enjoyed meeting [name] and look forward to participating in their care.  A copy of this report was sent to the requesting provider on this date.  Electronically Signed: Kennieth Francois, PA-C 10/11/2022, 12:44 PM   I spent a total of 20 Minutes in face to face in clinical consultation, greater than 50% of which was counseling/coordinating care for left hydronephrosis.

## 2022-10-11 NOTE — Progress Notes (Signed)
[facility] SURGICAL PROGRESS NOTE (cpt [id number])  Hospital Day(s): 0.   Interval History: Patient seen and examined, no acute events or new complaints overnight. Patient reports she is feeling better this morning. Abdomen is still distended to lower abdomen. No fever, chills, nausea, emesis. Leukocytosis making improvements; 12.2K this AM (from 22.2K on admission). Hgb to 10.8; stable. Renal function improving; sCr - 1.08; UO - unmeasured. Hypokalemia to 3.1. She is NPO. She continues on Rocephin/Flagyl.   Review of Systems:  Constitutional: denies fever, chills  HEENT: denies cough or congestion  Respiratory: denies any shortness of breath  Cardiovascular: denies chest pain or palpitations  Gastrointestinal: denies abdominal pain, N/V Genitourinary: denies burning with urination or urinary frequency Musculoskeletal: denies pain, decreased motor or sensation  Vital signs in last 24 hours: [min-max] current  Temp:  [97 F (36.1 C)-98.9 F (37.2 C)] 98.9 F (37.2 C) (10/02 0346) Pulse Rate:  [42-110] 88 (10/02 0346) Resp:  [17-19] 18 (10/02 0346) BP: (101-132)/(55-95) 109/57 (10/02 0346) SpO2:  [98 %-100 %] 98 % (10/02 0346) Weight:  [53.5 kg] 53.5 kg (10/01 0933)     Height: 5\' 2"  (157.5 cm) Weight: 53.5 kg BMI (Calculated): 21.57   Intake/Output last 2 shifts:  10/01 0701 - 10/02 0700 In: 1200.5 [I.V.:1000.5; IV Piggyback:200] Out: -    Physical Exam:  Constitutional: alert, cooperative and no distress  HENT: normocephalic without obvious abnormality  Eyes: PERRL, EOM's grossly intact and symmetric  Respiratory: breathing non-labored at rest  Cardiovascular: regular rate and sinus rhythm  Gastrointestinal: soft, lower abdomen remains distended; she is not overtly tender this AM, no rebound/guarding. She is not peritonitic Musculoskeletal: no edema or wounds, motor and sensation grossly intact, NT    Labs:     Latest Ref Rng & Units 10/11/2022    5:11 AM  10/10/2022    9:34 AM  CBC  WBC 4.0 - 10.5 K/uL 12.2  22.2   Hemoglobin 12.0 - 15.0 g/dL 60.4  54.0   Hematocrit 36.0 - 46.0 % 30.7  32.8   Platelets 150 - 400 K/uL 377  498       Latest Ref Rng & Units 10/11/2022    5:11 AM 10/10/2022    9:34 AM 10/09/2022    4:54 PM  CMP  Glucose 70 - 99 mg/dL 87  981    BUN 8 - 23 mg/dL 24  28    Creatinine 1.91 - 1.00 mg/dL 4.78  2.95  6.21   Sodium 135 - 145 mmol/L 138  133    Potassium 3.5 - 5.1 mmol/L 3.1  3.7    Chloride 98 - 111 mmol/L 106  97    CO2 22 - 32 mmol/L 21  23    Calcium 8.9 - 10.3 mg/dL 7.9  8.6    Total Protein 6.5 - 8.1 g/dL  7.6    Total Bilirubin 0.3 - 1.2 mg/dL  0.7    Alkaline Phos 38 - 126 U/L  86    AST 15 - 41 U/L  13    ALT 0 - 44 U/L  14      Imaging studies: No new pertinent imaging studies   Assessment/Plan: (ICD-10's: K89.2) 89 y.o. female with chronic diverticulitis vs colitis with colovesical fistula and possible colocolonic vs enterocolonic fistula as well   - I again tried my best to explain her disease process to her and our recommendations. I do think that at some point she needs to strongly consider colectomy.  Unfortunately, this will almost certainly result in in laparotomy and colostomy creation (ie: Hartman's) secondary to her age and significant inflammatory process in her pelvis. She is very hesitant to do so and adamantly declines right now. She states multiple times "I believe Abx will fix this." Fortunately, she is non-toxic and not overtly peritonitic currently with improvements in her leukocytosis this AM. I do not think there is any indication to proceed emergently. I will continue to make efforts to educate and discussion recommendations with patient and her family when available.    - For now, okay for CLD rom surgery perspective. It appears she is pending IR evaluation for nephrostomy tube, so would leave NPO for that.  - Agree with Abx (Rocephin, Flagyl) - Monitor abdominal examination;  on-going bowel function   - Pain control prn; antiemetics prn  - Monitor leukocytosis; improving - Again, suspect UTI secondary to colovesical fistula, likely will be chronic in nature - Mobilize as tolerated - Further management per primary service; we will follow    All of the above findings and recommendations were discussed with the patient, and the medical team, and all of patient's questions were answered to her expressed satisfaction.   -- Lynden Oxford, PA-C [facility] 10/11/2022, 7:52 AM M-F: 7am - 4pm

## 2022-10-11 NOTE — Procedures (Signed)
Interventional Radiology Procedure Note  Date of Procedure: 10/11/2022  Procedure: Left PCN placement   Findings:  1. Left PCN placement, 10 Fr to bag drain    Complications: No immediate complications noted.   Estimated Blood Loss: minimal  Follow-up and Recommendations: 1. Bedrest 2 hours  2. Monitor drain output  3. Follow up with Urology; continue to see IR for q6-8 week PCN exchanges while tube remains in place    Olive Bass, MD  Vascular & Interventional Radiology  10/11/2022 3:59 PM

## 2022-10-11 NOTE — Consult Note (Signed)
Wyline Mood , MD [address], [address], [place], [place], [address] [address], [place], [place], [address] Phone: [phone]  Fax: [phone]  Consultation  Referring Provider:     No ref. provider found Primary Care Physician:  Danella Penton, MD Primary Gastroenterologist: None   Reason for Consultation:     Abnormal ct scan   Date of Admission:  10/10/2022 Date of Consultation:  10/11/2022         HPI:   [name] is a 89 y.o. female with no past significant gastroenterology history present to the emergency room after she was asked by her PCP to come in as she had an abnormal CT scan result.  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  I was asked to see this patient for possible Crohn's disease.  The patient does not have any past GI history she says she has had a colonoscopy some years back and no significant abnormality was noted she has been seen by surgery and they had recommended surgical intervention the patient declined it.  She is presently on antibiotics.  Past Medical History:  Diagnosis Date   Arthritis    Asthma    as a child   Cancer (HCC)    GERD (gastroesophageal reflux disease)    Hypothyroidism    Pneumonia 1971   Thyroid nodule     Past Surgical History:  Procedure Laterality Date   ABDOMINAL HYSTERECTOMY     CATARACT EXTRACTION, BILATERAL     KNEE ARTHROPLASTY Left 03/24/2022   Procedure: COMPUTER ASSISTED TOTAL KNEE ARTHROPLASTY;  Surgeon:  Donato Heinz, MD;  Location: [facility] ORS;  Service: Orthopedics;  Laterality: Left;   thyroid gland removed  1977   THYROIDECTOMY, PARTIAL Left     Prior to Admission medications   Medication Sig Start Date End Date Taking? Authorizing Provider  amoxicillin (AMOXIL) 500 MG tablet Take 2,000 mg by mouth once. 09/28/22  Yes [provider]  amoxicillin-clavulanate (AUGMENTIN) 875-125 MG tablet Take 1 tablet by mouth 2 (two) times daily. 10/09/22  Yes [provider]  levothyroxine (SYNTHROID) 88 MCG tablet Take 88 mcg by mouth daily before breakfast. 08/14/22  Yes [provider]  mirtazapine (REMERON) 7.5 MG tablet Take 7.5 mg by mouth at bedtime. 09/05/22  Yes [provider]  predniSONE (DELTASONE) 10 MG tablet Take 10 mg by mouth daily with breakfast. 10/09/22 10/23/22 Yes [provider]  sucralfate (CARAFATE) 1 g tablet Take 1 g by mouth 2 (two) times daily. 06/15/22  Yes [provider]  acetaminophen (TYLENOL) 500 MG tablet Take 2 tablets (1,000 mg total) by mouth every 6 (six) hours. 11/29/19   Duanne Guess, MD  APPLE CIDER VINEGAR PO Take 1 tablet by mouth daily. 1 gummie daily    [provider]  ascorbic acid (VITAMIN C) 1000 MG tablet Take 1,000 mg by mouth daily.     [provider]  aspirin-acetaminophen-caffeine (EXCEDRIN MIGRAINE) [phone] MG tablet Take 1 tablet by mouth every 6 (six) hours as needed for headache.  [provider]  Calcium Carbonate-Vitamin D 600-200 MG-UNIT CAPS Take 1,200 mg by mouth daily. 11/29/19   Duanne Guess, MD  CALCIUM CITRATE PO Take 2 tablets by mouth daily. 600 mg    [provider]  celecoxib (CELEBREX) 200 MG capsule Take 1 capsule (200 mg total) by mouth 2 (two) times daily. 03/25/22   Anson Oregon, PA-C  Cyanocobalamin 5000 MCG TBDP Take 5,000 mcg by mouth daily.     [provider]  diclofenac Sodium (VOLTAREN ARTHRITIS PAIN) 1 % GEL  Apply 2 g topically as needed.    [provider]  enoxaparin (LOVENOX) 40 MG/0.4ML injection Inject 0.4 mLs (40 mg total) into the skin daily. 03/25/22   Anson Oregon, PA-C  folic acid (FOLVITE) 1 MG tablet Take 1 mg by mouth daily.    [provider]  hydrochlorothiazide (HYDRODIURIL) 25 MG tablet Take 25 mg by mouth daily.  08/01/19   [provider]  levothyroxine (SYNTHROID) 75 MCG tablet Take 1 tablet (75 mcg total) by mouth daily at 6 (six) AM. 11/30/19   Duanne Guess, MD  methotrexate (RHEUMATREX) 2.5 MG tablet Take 15 mg by mouth every Thursday. 7.5 mg once a week 10/16/19   [provider]  Multiple Vitamin (MULTI-VITAMIN) tablet Take 1 tablet by mouth daily. Centrum    [provider]  ondansetron (ZOFRAN) 4 MG tablet Take 1 tablet (4 mg total) by mouth every 6 (six) hours as needed for nausea. 03/25/22   Anson Oregon, PA-C  OVER THE COUNTER MEDICATION 1 Scoop daily. Multi collagen protein powder 1 scoop daily    [provider]  oxyCODONE (OXY IR/ROXICODONE) 5 MG immediate release tablet Take 1 tablet (5 mg total) by mouth every 4 (four) hours as needed for severe pain. 03/25/22   Anson Oregon, PA-C  pantoprazole (PROTONIX) 40 MG tablet Take 40 mg by mouth daily.    [provider]  traMADol (ULTRAM) 50 MG tablet Take 1-2 tablets (50-100 mg total) by mouth every 4 (four) hours as needed for moderate pain. 03/25/22   Anson Oregon, PA-C  TURMERIC PO Take 1,000 mg by mouth daily.    [provider]    History reviewed. No pertinent family history.   Social History   Tobacco Use   Smoking status: Former    Current packs/day: 0.00    Average packs/day: 1 pack/day for 42.0 years (42.0 ttl pk-yrs)    Types: Cigarettes    Start date: 11/17/1960    Quit date: 11/18/2002    Years since quitting: 19.9   Smokeless tobacco: Never  Vaping Use   Vaping status: Never Used  Substance Use Topics    Alcohol use: Never   Drug use: Never    Allergies as of 10/10/2022 - Review Complete 10/10/2022  Allergen Reaction Noted   Indomethacin Other (See Comments) and Palpitations 05/26/2014    Review of Systems:    All systems reviewed and negative except where noted in HPI.   Physical Exam:  Vital signs in last 24 hours: Temp:  [97 F (36.1 C)-98.9 F (37.2 C)] 98.9 F (37.2 C) (10/02 0346) Pulse Rate:  [42-110] 88 (10/02 0905) Resp:  [17-19] 18 (10/02 0905) BP: (101-136)/(55-95) 136/79 (10/02 0905) SpO2:  [98 %-100 %] 100 % (10/02 0905) Weight:  [53.5 kg] 53.5 kg (10/01 0933) Last BM Date : 10/10/22 General:   Pleasant, cooperative in NAD Head:  Normocephalic and atraumatic. Eyes:   No icterus.  Conjunctiva pink. PERRLA. Ears:  Normal auditory acuity. Neck:  Supple; no masses or thyroidomegaly Lungs: Respirations even and unlabored. Lungs clear to auscultation bilaterally.   No wheezes, crackles, or rhonchi.  Heart:  Regular rate and rhythm;  Without murmur, clicks, rubs or gallops Abdomen:  Soft, nondistended, nontender. Normal bowel sounds. No appreciable masses or hepatomegaly.  No rebound or guarding.  Neurologic:  Alert and oriented x3;  grossly normal neurologically. Psych:  Alert and cooperative. Normal affect.  LAB RESULTS: Recent Labs    10/10/22 0934 10/11/22 0511  WBC 22.2* 12.2*  HGB 10.8* 10.0*  HCT 32.8* 30.7*  PLT 498* 377   BMET Recent Labs    10/09/22 1654 10/10/22 0934 10/11/22 0511  NA  --  133* 138  K  --  3.7 3.1*  CL  --  97* 106  CO2  --  23 21*  GLUCOSE  --  119* 87  BUN  --  28* 24*  CREATININE 1.50* 1.48* 1.08*  CALCIUM  --  8.6* 7.9*   LFT Recent Labs    10/10/22 0934  PROT 7.6  ALBUMIN 2.7*  AST 13*  ALT 14  ALKPHOS 86  BILITOT 0.7   PT/INR Recent Labs    10/11/22 0511  LABPROT 16.0*  INR 1.3*    STUDIES: CT ABDOMEN PELVIS W WO CONTRAST  Result Date: 10/09/2022 CLINICAL DATA:  Abdominal pain, weight loss, C  diff colitis status post vancomycin EXAM: CT ABDOMEN AND PELVIS WITHOUT AND WITH CONTRAST TECHNIQUE: Multidetector CT imaging of the abdomen and pelvis was performed following the standard protocol before and following the bolus administration of intravenous contrast. RADIATION DOSE REDUCTION: This exam was performed according to the departmental dose-optimization program which includes automated exposure control, adjustment of the mA and/or kV according to patient size and/or use of iterative reconstruction technique. CONTRAST:  65mL OMNIPAQUE IOHEXOL 300 MG/ML  SOLN COMPARISON:  None Available. FINDINGS: Lower chest: Lung bases are clear. Hepatobiliary: Liver is within normal limits. Layering tiny gallstones (series 7/image 80), without associated inflammatory changes. No intrahepatic or extrahepatic ductal dilatation. Pancreas: Within normal limits. Spleen: Within normal limits. Adrenals/Urinary Tract: Adrenal glands are within normal limits. Subcentimeter posterior right upper pole renal cyst (series 2/image 17), benign (Bosniak I). Additional dominant 3.1 cm left lower pole renal cyst (series 12/image 30), benign (Bosniak I). No follow-up is recommended. No renal, ureteral, or bladder calculi. Moderate to severe left hydroureteronephrosis. Narrowing/extrinsic compression of the mid left ureter or (series 7/image 4), likely related to inflammatory changes (described below). Thick-walled bladder with mucosal hyperenhancement and nondependent gas (series 7, image 67), likely reflecting cystitis, favored to be secondary to a colovesical fistula (series 11/image 49). Stomach/Bowel: Stomach is notable for a moderate hiatal hernia. No evidence of bowel obstruction. Appendix is not discretely visualized. Wall thickening involving mid/distal ileum in the central lower abdomen (series 7/image 2), with adjacent enterocolonic (series 7/image 5) and colocolonic fistula (series 7/image 61). Adjacent wall thickening involving  the rectosigmoid colon (series 7/image 60). No drainable fluid collection/abscess. No free air. Vascular/Lymphatic: No evidence of abdominal aortic aneurysm. Atherosclerotic calcifications of the abdominal aorta and branch vessels, although vessels remain patent. No suspicious abdominopelvic lymphadenopathy. Reproductive: Status post hysterectomy. Right ovary is within normal limits. Left ovary is notable for a dominant 2.8 cm cyst/follicle (series 7, image 88). Other: No abdominopelvic ascites. Musculoskeletal: Degenerative changes of the visualized thoracolumbar spine. IMPRESSION: Enterocolonic and colocolonic fistulas, with associated inflammatory changes involving the rectosigmoid colon and an adjacent  loop of distal small bowel. No drainable fluid collection/abscess. No pneumatosis or free air. 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 Moderate to severe left hydroureteronephrosis, likely related to extrinsic compression by the inflammatory changes noted above. Additional ancillary findings as above. These results will be called to the ordering clinician or representative by the Radiologist Assistant, and communication documented in the PACS or [employer]. Electronically Signed   By: Charline Bills M.D.   On: 10/09/2022 20:13      Impression / Plan:   [name] is a 89 y.o. y/o female with no past history of inflammatory bowel disease or gastroenterological issues comes into the hospital after her PCP after 2 do so for an abnormal CT scan of the abdomen.  She has been having a history of intermittent abdominal pains generalized for the past few weeks treated with a few rounds of antibiotics.  Every time she gets the antibiotic she feels better than the pain returns.  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  This seem to be more on the left side of the colon particular in the rectosigmoid area.  I have been called to evaluate for Crohn's  disease.  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  From the gastroenterology point of view would not entertain any endoscopy at this point of time due to the significant inflammation we would agree with general surgery recommendations for surgery/antibiotic therapy and would recommend imaging in 4 to 6 weeks to determine any residual inflammation/changes and possibly could consider an MR enterogram as well at that point of time.  She also has a UTI and hydronephrosis and would not recommend any steroids unless we have a clear diagnosis of Crohn's disease.   I will sign off.  Please call me if any further GI concerns or questions.  We would like to thank you for the opportunity to participate in the care of [name].   Thank you for involving me in the care of this patient.      LOS: 0 days   Wyline Mood, MD  10/11/2022, 9:16 AM

## 2022-10-11 NOTE — Consult Note (Signed)
Pharmacy Antibiotic Note  [name] is a 89 y.o. female admitted on 10/10/2022 with intraabdominal infection.  Pharmacy has been consulted for Zosyn dosing.  Plan: Zosyn 3.375g IV q8h (4 hour infusion).  Height: 5\' 2"  (157.5 cm) Weight: 53.5 kg (117 lb 15.1 oz) IBW/kg (Calculated) : 50.1  Temp (24hrs), Avg:98.4 F (36.9 C), Min:97.8 F (36.6 C), Max:98.9 F (37.2 C)  Recent Labs  Lab 10/09/22 1654 10/10/22 0934 10/10/22 1045 10/10/22 1820 10/11/22 0511  WBC  --  22.2*  --   --  12.2*  CREATININE 1.50* 1.48*  --   --  1.08*  LATICACIDVEN  --   --  1.1 0.9  --     Estimated Creatinine Clearance: 29 mL/min (A) (by C-G formula based on SCr of 1.08 mg/dL (H)).    Allergies  Allergen Reactions   Indomethacin Other (See Comments) and Palpitations    Antimicrobials this admission: Ceftriaxone 10/1 >> 10/2 Flagyl 10/1 >> 10/2 Zosyn 10/2>>  Dose adjustments this admission: N/A  Microbiology results: 10/1 BCx: ngtd 10/1 UCx: Klebsiella pneumoniae    Thank you for allowing pharmacy to be a part of this patient's care.  Barrie Folk 10/11/2022 4:50 PM

## 2022-10-11 NOTE — Progress Notes (Signed)
PHARMACY CONSULT NOTE  Pharmacy Consult for Electrolyte Monitoring and Replacement   Recent Labs: Potassium (mmol/L)  Date Value  10/11/2022 3.1 (L)   Calcium (mg/dL)  Date Value  11/91/4782 7.9 (L)   Albumin (g/dL)  Date Value  95/62/1308 2.7 (L)   Sodium (mmol/L)  Date Value  10/11/2022 138     Assessment: 89 y.o. female with medical history significant of hypothyroidism, thyroid cancer status post partial thyroidectomy, essential hypertension not on any home medications, C. difficile colitis, and GERD. Pharmacy is asked to follow and replace electrolytes  Goal of Therapy:  Electrolytes WNL  Plan:  ---KCl 40 mEQ po x 1 --recheck electrolytes in am  Lowella Bandy ,PharmD Clinical Pharmacist 10/11/2022 7:28 AM

## 2022-10-11 NOTE — Plan of Care (Signed)

## 2022-10-11 NOTE — Progress Notes (Signed)
[facility]  - [place] [facility]   PATIENT NAME: [name]    MR#:  [id number]  DATE OF BIRTH:  20-Jan-1933  SUBJECTIVE:  no family at bedside. Patient denies any abdominal pain. Explained to her about the nephrectomy tube placement. She is agreeable to it. No diarrhea or vomiting.  VITALS:  Blood pressure 136/79, pulse 88, temperature 98.9 F (37.2 C), temperature source Oral, resp. rate 18, height 5\' 2"  (1.575 m), weight 53.5 kg, SpO2 100%.  PHYSICAL EXAMINATION:   GENERAL:  89 y.o.-year-old patient with no acute distress.  LUNGS: Normal breath sounds bilaterally, no wheezing CARDIOVASCULAR: S1, S2 normal. No murmur   ABDOMEN: Soft, nontender, nondistended. Bowel sounds present.  EXTREMITIES: No  edema b/l.    NEUROLOGIC: nonfocal  patient is alert and awake  LABORATORY PANEL:  CBC Recent Labs  Lab 10/11/22 0511  WBC 12.2*  HGB 10.0*  HCT 30.7*  PLT 377    Chemistries  Recent Labs  Lab 10/10/22 0934 10/11/22 0511  NA 133* 138  K 3.7 3.1*  CL 97* 106  CO2 23 21*  GLUCOSE 119* 87  BUN 28* 24*  CREATININE 1.48* 1.08*  CALCIUM 8.6* 7.9*  AST 13*  --   ALT 14  --   ALKPHOS 86  --   BILITOT 0.7  --    Cardiac Enzymes No results for input(s): "TROPONINI" in the last 168 hours. RADIOLOGY:  CT ABDOMEN PELVIS W WO CONTRAST  Result Date: 10/09/2022 CLINICAL DATA:  Abdominal pain, weight loss, C diff colitis status post vancomycin EXAM: CT ABDOMEN AND PELVIS WITHOUT AND WITH CONTRAST TECHNIQUE: Multidetector CT imaging of the abdomen and pelvis was performed following the standard protocol before and following the bolus administration of intravenous contrast. RADIATION DOSE REDUCTION: This exam was performed according to the departmental dose-optimization program which includes automated exposure control, adjustment of the mA and/or kV according to patient size and/or use of iterative reconstruction technique. CONTRAST:  65mL OMNIPAQUE IOHEXOL 300  MG/ML  SOLN COMPARISON:  None Available. FINDINGS: Lower chest: Lung bases are clear. Hepatobiliary: Liver is within normal limits. Layering tiny gallstones (series 7/image 80), without associated inflammatory changes. No intrahepatic or extrahepatic ductal dilatation. Pancreas: Within normal limits. Spleen: Within normal limits. Adrenals/Urinary Tract: Adrenal glands are within normal limits. Subcentimeter posterior right upper pole renal cyst (series 2/image 17), benign (Bosniak I). Additional dominant 3.1 cm left lower pole renal cyst (series 12/image 30), benign (Bosniak I). No follow-up is recommended. No renal, ureteral, or bladder calculi. Moderate to severe left hydroureteronephrosis. Narrowing/extrinsic compression of the mid left ureter or (series 7/image 4), likely related to inflammatory changes (described below). Thick-walled bladder with mucosal hyperenhancement and nondependent gas (series 7, image 67), likely reflecting cystitis, favored to be secondary to a colovesical fistula (series 11/image 49). Stomach/Bowel: Stomach is notable for a moderate hiatal hernia. No evidence of bowel obstruction. Appendix is not discretely visualized. Wall thickening involving mid/distal ileum in the central lower abdomen (series 7/image 2), with adjacent enterocolonic (series 7/image 5) and colocolonic fistula (series 7/image 61). Adjacent wall thickening involving the rectosigmoid colon (series 7/image 60). No drainable fluid collection/abscess. No free air. Vascular/Lymphatic: No evidence of abdominal aortic aneurysm. Atherosclerotic calcifications of the abdominal aorta and branch vessels, although vessels remain patent. No suspicious abdominopelvic lymphadenopathy. Reproductive: Status post hysterectomy. Right ovary is within normal limits. Left ovary is notable for a dominant 2.8 cm cyst/follicle (series 7, image 88). Other: No abdominopelvic ascites. Musculoskeletal: Degenerative changes of  the visualized  thoracolumbar spine. IMPRESSION: Enterocolonic and colocolonic fistulas, with associated inflammatory changes involving the rectosigmoid colon and an adjacent loop of distal small bowel. No drainable fluid collection/abscess. No pneumatosis or free air. 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 Moderate to severe left hydroureteronephrosis, likely related to extrinsic compression by the inflammatory changes noted above. Additional ancillary findings as above. These results will be called to the ordering clinician or representative by the Radiologist Assistant, and communication documented in the PACS or [employer]. Electronically Signed   By: Charline Bills M.D.   On: 10/09/2022 20:13    Assessment and Plan  [name] is a 89 y.o. female with medical history significant of hypothyroidism, thyroid cancer status post partial thyroidectomy, essential hypertension not on any home medications, C. difficile colitis, and GERD.  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  Sepsis with Intra-abdominal Source Multiple Colonic Fistulas  Acute Cystitis/left severe hydronephrosis --HR 110, WBC 22.2;  --CT Abdomen Pelvis shows enterocolonic and colocolonic fistulas with associated inflammatory changes, No fluid collection/abscess, no free air. 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 - Continue Rocephin and Flagyl - IV Fluids: LR at 75 mL/h -- urine culture-- >100K  gram-negative rods -- blood culture negative - Follow Fever curve - General Surgery consult-- appreciated. Dr. Elenor Legato spoke with patient and family.  Tentatively placed on for surgery on Friday. - Gastroenterology consulted for multiple colonic fistulas concerning for inflammatory bowel process, appreciate their consultation and recommendations-- G.I. signed off. - Urology consulted for left hydroureteronephrosis -- patient to get nephrectomy drain placed with interventional radiology   Acute Kidney Injury due to Obstructive Uropathy --Likely post renal; moderate to severe (L) hydroureteronephrosis related to compression by inflammatory changes in bowel seen on CT Abdomen/Pelvis - IVF as above - Treat infection as above - Avoid nephrotoxic meds, Contrast Dyes, Hypotension and Dehydration  - pt to get Left side nephrectomy drain per IR   Normocytic Anemia --stable   Thrombocytosis Appears chronic in nature, PLT was 463 on 06/19/22 in the [facility] system   Hypothyroidism  Hx of Thyroid Cancer s/p Partial Thyroidectomy -Continue home Levothyroxine   Essential Hypertension - Not on any outpatient medication   GERD - Protonix   Procedures: Family communication :none today. Dr Everlene Farrier spoke with pt' s brother Consults : G.I., general surgery, urology CODE STATUS: full DVT Prophylaxis : Level of care: Telemetry Medical Status is: Inpatient Remains inpatient appropriate because: sepsis due to colonic fistula and UTI    TOTAL TIME TAKING CARE OF THIS PATIENT: 35 minutes.  >50% time spent on counselling and coordination of care  Note: This dictation was prepared with Dragon dictation along with smaller phrase technology. Any transcriptional errors that result from this process are unintentional.  Enedina Finner M.D    [facility]   CC: Primary care physician; Danella Penton, MD

## 2022-10-12 DIAGNOSIS — N179 Acute kidney failure, unspecified: Secondary | ICD-10-CM | POA: Diagnosis not present

## 2022-10-12 DIAGNOSIS — D649 Anemia, unspecified: Secondary | ICD-10-CM | POA: Diagnosis not present

## 2022-10-12 DIAGNOSIS — K632 Fistula of intestine: Secondary | ICD-10-CM | POA: Diagnosis not present

## 2022-10-12 DIAGNOSIS — A419 Sepsis, unspecified organism: Secondary | ICD-10-CM | POA: Diagnosis not present

## 2022-10-12 LAB — URINE CULTURE: Culture: >=100000 — AB

## 2022-10-12 LAB — MAGNESIUM: Magnesium: 2 mg/dL (ref 1.7–2.4)

## 2022-10-12 LAB — BASIC METABOLIC PANEL
Anion gap: 10 (ref 5–15)
BUN: 19 mg/dL (ref 8–23)
CO2: 21 mmol/L — ABNORMAL LOW (ref 22–32)
Calcium: 7.9 mg/dL — ABNORMAL LOW (ref 8.9–10.3)
Chloride: 106 mmol/L (ref 98–111)
Creatinine, Ser: 0.99 mg/dL (ref 0.44–1.00)
GFR, Estimated: 55 mL/min — ABNORMAL LOW (ref >60)
Glucose, Bld: 102 mg/dL — ABNORMAL HIGH (ref 70–99)
Potassium: 3.7 mmol/L (ref 3.5–5.1)
Sodium: 137 mmol/L (ref 135–145)

## 2022-10-12 LAB — CBC
HCT: 32.3 % — ABNORMAL LOW (ref 36.0–46.0)
Hemoglobin: 10.3 g/dL — ABNORMAL LOW (ref 12.0–15.0)
MCH: 28.5 pg (ref 26.0–34.0)
MCHC: 31.9 g/dL (ref 30.0–36.0)
MCV: 89.2 fL (ref 80.0–100.0)
Platelets: 388 10*3/uL (ref 150–400)
RBC: 3.62 MIL/uL — ABNORMAL LOW (ref 3.87–5.11)
RDW: 14.4 % (ref 11.5–15.5)
WBC: 14.9 10*3/uL — ABNORMAL HIGH (ref 4.0–10.5)
nRBC: 0 % (ref 0.0–0.2)

## 2022-10-12 MED ORDER — METRONIDAZOLE 500 MG PO TABS
500.0000 mg | ORAL_TABLET | Freq: Four times a day (QID) | ORAL | Status: AC
  Administered 2022-10-12 (×2): 500 mg via ORAL
  Filled 2022-10-12 (×3): qty 1

## 2022-10-12 MED ORDER — SODIUM CHLORIDE 0.9 % IV SOLN: INTRAVENOUS | Status: DC | PRN

## 2022-10-12 MED ORDER — METRONIDAZOLE 500 MG PO TABS
500.0000 mg | ORAL_TABLET | Freq: Four times a day (QID) | ORAL | Status: DC
  Administered 2022-10-12: 500 mg via ORAL
  Filled 2022-10-12 (×2): qty 1

## 2022-10-12 MED ORDER — NEOMYCIN SULFATE 500 MG PO TABS
1000.0000 mg | ORAL_TABLET | Freq: Four times a day (QID) | ORAL | Status: AC
  Administered 2022-10-12 (×2): 1000 mg via ORAL
  Filled 2022-10-12 (×3): qty 2

## 2022-10-12 NOTE — Progress Notes (Signed)
PHARMACY CONSULT NOTE  Pharmacy Consult for Electrolyte Monitoring and Replacement   Recent Labs: Potassium (mmol/L)  Date Value  10/12/2022 3.7   Magnesium (mg/dL)  Date Value  16/10/9602 2.0   Calcium (mg/dL)  Date Value  54/09/8117 7.9 (L)   Albumin (g/dL)  Date Value  14/78/2956 2.7 (L)   Sodium (mmol/L)  Date Value  10/12/2022 137   Assessment: 89 y.o. female with medical history significant of hypothyroidism, thyroid cancer status post partial thyroidectomy, essential hypertension not on any home medications, C. difficile colitis, and GERD. Pharmacy is asked to follow and replace electrolytes  Goal of Therapy:  Electrolytes WNL  Plan:  --No electrolyte replacement indicated at this time --Labs are stabilizing. Will defer ordering of labs tomorrow. Consider decreasing frequency of lab checks to q48-q72h  Tressie Ellis 10/12/2022 7:13 AM

## 2022-10-12 NOTE — TOC Initial Note (Addendum)
Transition of Care [facility]) - Initial/Assessment Note    Patient Details  Name: [name] MRN: [id number] Date of Birth: 07-05-1933  Transition of Care [facility]) CM/SW Contact:    Chapman Fitch, RN Phone Number: 10/12/2022, 2:36 PM  Clinical Narrative:                  Patient for surgery tomorrow with plans for creation of ostomy. Discussed discharge planning with MD and need for home health.  MD request for TOC to await Pt eval post op to be completed prior to discussing disposition with patient      Met patient and sister in law at bedside to introduce role   Admitted from: home alone PCP: Allena Katz  Patient active with home RN and PT with [facility]. Elnita Maxwell with [facility] notified of admission    Patient Goals and CMS Choice            Expected Discharge Plan and Services                                              Prior Living Arrangements/Services                       Activities of Daily Living   ADL Screening (condition at time of admission) Independently performs ADLs?: Yes (appropriate for developmental age) Is the patient deaf or have difficulty hearing?: Yes Does the patient have difficulty seeing, even when wearing glasses/contacts?: No Does the patient have difficulty concentrating, remembering, or making decisions?: No  Permission Sought/Granted                  Emotional Assessment              Admission diagnosis:  Sepsis (HCC) [A41.9] Sepsis without acute organ dysfunction, due to unspecified organism [facility]) [A41.9] Patient Active Problem List   Diagnosis Date Noted   Sepsis (HCC) 10/10/2022   AKI (acute kidney injury) (HCC) 10/10/2022   Normocytic anemia 10/10/2022   Thrombocytosis 10/10/2022   Cystitis 10/10/2022   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 [place] [address] Phone: [phone] Fax: [phone]     Social Determinants of Health (SDOH) Social History: SDOH Screenings   Food Insecurity: No Food Insecurity (10/10/2022)  Housing: Low Risk  (10/10/2022)  Transportation Needs: No Transportation Needs (10/10/2022)  Utilities: Not At Risk (10/10/2022)  Financial Resource Strain: Low Risk  (10/09/2022)   Received from [facility]  Tobacco Use: Medium Risk (10/10/2022)   SDOH Interventions:     Readmission Risk Interventions     No data to display

## 2022-10-12 NOTE — Consult Note (Signed)
WOC Nurse requested for preoperative stoma site marking  Discussed surgical procedure and stoma creation with patient.  Explained role of the WOC nurse team.  Provided the patient with educational booklet and provided samples of pouching options.  Answered patient questions.   Examined patient lying, sitting, and standing in order to place the marking in the patient's visual field, away from any creases or abdominal contour issues and within the rectus muscle.  Attempted to mark below the patient's belt line. Patient has significant crease mid abdomen that was marked to avoid.   Marked for colostomy in the LLQ  5.5  cm to the left of the umbilicus and 3.5 cm  below the umbilicus.  Marked for ileostomy in the RLQ  4 cm to the right of the umbilicus and 4 cm below the umbilicus.   Patient's abdomen cleansed with CHG wipes at site markings, allowed to air dry prior to marking.Covered mark with thin film transparent dressing to preserve mark until date of surgery.   WOC Nurse team will follow up with patient after surgery for continue ostomy care and teaching.   Thank you,    Priscella Mann MSN, RN-BC, [employer] [phone]

## 2022-10-12 NOTE — Progress Notes (Signed)
Referring Physician(s): McGowan,Shannon A  Supervising Physician: Irish Lack  Patient Status:  [facility] - In-pt  Chief Complaint:  Left hydronephrosis s/p left PCN placement 10/11/22  Subjective:  Patient sitting comfortably in bed with daughter at bedside. She denies pain at PCN site, notes annoyance with bowel prep for surgery to take place tomorrow.  Allergies: Indomethacin  Medications: Prior to Admission medications   Medication Sig Start Date End Date Taking? Authorizing Provider  acetaminophen (TYLENOL) 500 MG tablet Take 2 tablets (1,000 mg total) by mouth every 6 (six) hours. 11/29/19  Yes Duanne Guess, MD  APPLE CIDER VINEGAR PO Take 1 tablet by mouth daily. 1 gummie daily   Yes [provider]  ascorbic acid (VITAMIN C) 1000 MG tablet Take 1,000 mg by mouth daily.    Yes [provider]  aspirin-acetaminophen-caffeine (EXCEDRIN MIGRAINE) [phone] MG tablet Take 1 tablet by mouth every 6 (six) hours as needed for headache.   Yes [provider]  Calcium Carbonate-Vitamin D 600-200 MG-UNIT CAPS Take 1,200 mg by mouth daily. 11/29/19  Yes Duanne Guess, MD  CALCIUM CITRATE PO Take 2 tablets by mouth daily. 600 mg   Yes [provider]  levothyroxine (SYNTHROID) 88 MCG tablet Take 88 mcg by mouth daily before breakfast. Takes only 1/2 tablet every Sunday 08/14/22  Yes [provider]  Multiple Vitamin (MULTI-VITAMIN) tablet Take 1 tablet by mouth daily. Centrum   Yes [provider]  pantoprazole (PROTONIX) 40 MG tablet Take 40 mg by mouth daily.   Yes [provider]  TURMERIC PO Take 1,000 mg by mouth daily.   Yes [provider]  methotrexate (RHEUMATREX) 2.5 MG tablet Take 7.5 mg by mouth once a week. 7.5 mg once a week on Saturdays 10/16/19   [provider]     Vital Signs: BP (!) 111/58 (BP Location: Left Arm)   Pulse 99   Temp 98 F (36.7 C)   Resp 20   Ht 5\' 2"  (1.575  m)   Wt 117 lb 15.1 oz (53.5 kg)   SpO2 90%   BMI 21.57 kg/m   Physical Exam Vitals reviewed.  Pulmonary:     Effort: Pulmonary effort is normal.  Genitourinary:    Comments: Left PCN in place with ~100 mL of light yellow urine in bag. Site unremarkable with no sign of bleeding, erythema, or drainage. Dressing in place, dressing clean, dry, intact. Suture and stat lock intact.  Skin:    General: Skin is warm and dry.  Neurological:     Mental Status: She is alert and oriented to person, place, and time.  Psychiatric:        Mood and Affect: Mood normal.        Behavior: Behavior normal.        Thought Content: Thought content normal.        Judgment: Judgment normal.     Imaging: IR NEPHROSTOMY PLACEMENT LEFT  Result Date: 10/11/2022 INDICATION: Hydronephrosis in the setting of inflammatory bowel process and distal ureteral obstruction EXAM: Placement of percutaneous nephrostomy tube using ultrasound and fluoroscopic guidance COMPARISON:  None Available. MEDICATIONS: Documented in the EMR ANESTHESIA/SEDATION: Moderate (conscious) sedation was employed during this procedure. A total of Versed 0.5 mg and Fentanyl 25 mcg was administered intravenously. Moderate Sedation Time: 30 minutes. The patient's level of consciousness and vital signs were monitored continuously by radiology nursing throughout the procedure under my direct supervision. CONTRAST:  30 mL-administered into the collecting system(s) FLUOROSCOPY  TIME:  Fluoroscopy Time: 5.8 minutes (21 mGy) COMPLICATIONS: None immediate. PROCEDURE: Informed written consent was obtained from the patient after a thorough discussion of the procedural risks, benefits and alternatives. All questions were addressed. Maximal Sterile Barrier Technique was utilized including caps, mask, sterile gowns, sterile gloves, sterile drape, hand hygiene and skin antiseptic. A timeout was performed prior to the initiation of the procedure. The patient was placed  prone on the exam table. The left flank was prepped and draped in the standard sterile fashion. Ultrasound was used to evaluate the left kidney, which demonstrated hydronephrosis. Skin entry site was marked, and local analgesia was obtained with 1% lidocaine. Using ultrasound guidance, an upper pole calyx was initially selected due to positioning of the ribs and high position of the splenic flexure of the colon based on recent CT and visualization on ultrasound. Puncture was obtained using a 21-gauge Chiba needle. Entry into the collecting system was confirmed with return of urine, and gentle injection of contrast material under fluoroscopy opacifying the proximal collecting system. An 018 wire was advanced through the needle into the renal pelvis and proximal ureter, followed by placement of a transition dilator. An antegrade nephrostogram was then performed, which at this point demonstrated extra ureteral contrast material. The transitional dilator was withdrawn over wire in attempt to opacify the proximal collecting system, which was unsuccessful. The initial access was removed, and the decision was made to proceed with a second attempt at access. Again using ultrasound guidance, this time a lower pole calyx was identified as safe for percutaneous access. While maintaining visualization of the splenic flexure as best possible on ultrasound, access was obtained into the proximal collecting system using a 21 gauge Chiba needle. Entry into the collecting system was again confirmed with return of urine and gentle injection of contrast material opacifying the proximal collecting system. An 018 wire was advanced through the needle into the renal pelvis and proximal ureter, followed by placement of a transition dilator. An antegrade nephrostogram was then performed, this time demonstrating appropriate location in the proximal collecting system. Over an 035 Amplatz wire, the percutaneous tract was serially dilated, and a  10.2 French nephrostomy tube was advanced with the loop formed in the renal pelvis. Appropriate positioning of the nephrostomy tube was confirmed with injection of contrast material. The nephrostomy tube was secured to skin using silk suture and a dressing. It was attached to bag drainage. The patient tolerated the procedure well without immediate complication. IMPRESSION: Successful placement of a left-sided 10 French percutaneous nephrostomy tube. Nephrostomy tube placed to bag drainage. Electronically Signed   By: Olive Bass M.D.   On: 10/11/2022 16:19   CT ABDOMEN PELVIS W WO CONTRAST  Result Date: 10/09/2022 CLINICAL DATA:  Abdominal pain, weight loss, C diff colitis status post vancomycin EXAM: CT ABDOMEN AND PELVIS WITHOUT AND WITH CONTRAST TECHNIQUE: Multidetector CT imaging of the abdomen and pelvis was performed following the standard protocol before and following the bolus administration of intravenous contrast. RADIATION DOSE REDUCTION: This exam was performed according to the departmental dose-optimization program which includes automated exposure control, adjustment of the mA and/or kV according to patient size and/or use of iterative reconstruction technique. CONTRAST:  65mL OMNIPAQUE IOHEXOL 300 MG/ML  SOLN COMPARISON:  None Available. FINDINGS: Lower chest: Lung bases are clear. Hepatobiliary: Liver is within normal limits. Layering tiny gallstones (series 7/image 80), without associated inflammatory changes. No intrahepatic or extrahepatic ductal dilatation. Pancreas: Within normal limits. Spleen: Within normal limits. Adrenals/Urinary Tract: Adrenal  glands are within normal limits. Subcentimeter posterior right upper pole renal cyst (series 2/image 17), benign (Bosniak I). Additional dominant 3.1 cm left lower pole renal cyst (series 12/image 30), benign (Bosniak I). No follow-up is recommended. No renal, ureteral, or bladder calculi. Moderate to severe left hydroureteronephrosis.  Narrowing/extrinsic compression of the mid left ureter or (series 7/image 4), likely related to inflammatory changes (described below). Thick-walled bladder with mucosal hyperenhancement and nondependent gas (series 7, image 67), likely reflecting cystitis, favored to be secondary to a colovesical fistula (series 11/image 49). Stomach/Bowel: Stomach is notable for a moderate hiatal hernia. No evidence of bowel obstruction. Appendix is not discretely visualized. Wall thickening involving mid/distal ileum in the central lower abdomen (series 7/image 2), with adjacent enterocolonic (series 7/image 5) and colocolonic fistula (series 7/image 61). Adjacent wall thickening involving the rectosigmoid colon (series 7/image 60). No drainable fluid collection/abscess. No free air. Vascular/Lymphatic: No evidence of abdominal aortic aneurysm. Atherosclerotic calcifications of the abdominal aorta and branch vessels, although vessels remain patent. No suspicious abdominopelvic lymphadenopathy. Reproductive: Status post hysterectomy. Right ovary is within normal limits. Left ovary is notable for a dominant 2.8 cm cyst/follicle (series 7, image 88). Other: No abdominopelvic ascites. Musculoskeletal: Degenerative changes of the visualized thoracolumbar spine. IMPRESSION: Enterocolonic and colocolonic fistulas, with associated inflammatory changes involving the rectosigmoid colon and an adjacent loop of distal small bowel. No drainable fluid collection/abscess. No pneumatosis or free air. 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 Moderate to severe left hydroureteronephrosis, likely related to extrinsic compression by the inflammatory changes noted above. Additional ancillary findings as above. These results will be called to the ordering clinician or representative by the Radiologist Assistant, and communication documented in the PACS or [employer]. Electronically Signed   By: Charline Bills M.D.   On: 10/09/2022 20:13     Labs:  CBC: Recent Labs    10/10/22 0934 10/11/22 0511 10/12/22 0320  WBC 22.2* 12.2* 14.9*  HGB 10.8* 10.0* 10.3*  HCT 32.8* 30.7* 32.3*  PLT 498* 377 388    COAGS: Recent Labs    10/11/22 0511  INR 1.3*    BMP: Recent Labs    03/14/22 1432 10/09/22 1654 10/10/22 0934 10/11/22 0511 10/12/22 0320  NA 138  --  133* 138 137  K 3.8  --  3.7 3.1* 3.7  CL 101  --  97* 106 106  CO2 29  --  23 21* 21*  GLUCOSE 95  --  119* 87 102*  BUN 42*  --  28* 24* 19  CALCIUM 9.2  --  8.6* 7.9* 7.9*  CREATININE 1.16* 1.50* 1.48* 1.08* 0.99  GFRNONAA 46*  --  34* 50* 55*    LIVER FUNCTION TESTS: Recent Labs    03/14/22 1432 10/10/22 0934  BILITOT 0.7 0.7  AST 29 13*  ALT 35 14  ALKPHOS 58 86  PROT 7.1 7.6  ALBUMIN 3.9 2.7*    Assessment and Plan:  Left hydronephrosis s/p left PCN placement -Patient tolerating PCN well. Drain functioning with 800 mL of output documented since placement. ~100 mL of light yellow urine in drain bag at time of exam -Hgb 10.3 (10.0), WBC 14.9 (12.2)  Drain Location: CVA left Size: Fr size: 10 Fr Date of placement: 10/10/22  Currently to: Drain collection device: gravity 24 hour output: 800 mL documented  Interval imaging/drain manipulation:  None  Current examination: Flushes/aspirates easily.  Insertion site unremarkable. Suture and stat lock in place. Dressed appropriately.   Plan: Record output Q shift.  Dressing changes QD or PRN if soiled.  Call IR APP or on call IR MD if difficulty flushing or sudden change in drain output.    Discharge planning: Please contact IR APP or on call IR MD prior to patient d/c to ensure appropriate follow up plans are in place. Typically patient will follow up in 6-8 weeks post d/c for repeat imaging/PCN exchange. IR scheduler will contact patient with date/time of appointment. Patient will need dressing changes every 2-3 days or earlier if soiled.   IR will continue to follow - please call  with questions or concerns.      Electronically Signed: Kennieth Francois, PA-C 10/12/2022, 3:38 PM   I spent a total of 15 Minutes at the the patient's bedside AND on the patient's hospital floor or unit, greater than 50% of which was counseling/coordinating care for left hydronephrosis s/p left PCN placement.

## 2022-10-12 NOTE — Progress Notes (Signed)
[facility]  - [place] [facility]   PATIENT NAME: [name]    MR#:  [id number]  DATE OF BIRTH:  22-May-1933  SUBJECTIVE:  Met with brother and SIL at bedside. Patient denies any abdominal pain. Pt is for abd surgery in am Discussed code status--full code for now  VITALS:  Blood pressure 134/78, pulse (!) 101, temperature 98.6 F (37 C), temperature source Oral, resp. rate 20, height 5\' 2"  (1.575 m), weight 53.5 kg, SpO2 97%.  PHYSICAL EXAMINATION:   GENERAL:  89 y.o.-year-old patient with no acute distress.  LUNGS: Normal breath sounds bilaterally, no wheezing CARDIOVASCULAR: S1, S2 normal. No murmur   ABDOMEN: Soft, non tender. Left Nephrostomy drain+ EXTREMITIES: No  edema b/l.    NEUROLOGIC: nonfocal  patient is alert and awake  LABORATORY PANEL:  CBC Recent Labs  Lab 10/12/22 0320  WBC 14.9*  HGB 10.3*  HCT 32.3*  PLT 388    Chemistries  Recent Labs  Lab 10/10/22 0934 10/11/22 0511 10/12/22 0320  NA 133*   < > 137  K 3.7   < > 3.7  CL 97*   < > 106  CO2 23   < > 21*  GLUCOSE 119*   < > 102*  BUN 28*   < > 19  CREATININE 1.48*   < > 0.99  CALCIUM 8.6*   < > 7.9*  MG  --   --  2.0  AST 13*  --   --   ALT 14  --   --   ALKPHOS 86  --   --   BILITOT 0.7  --   --    < > = values in this interval not displayed.   Cardiac Enzymes No results for input(s): "TROPONINI" in the last 168 hours. RADIOLOGY:  IR NEPHROSTOMY PLACEMENT LEFT  Result Date: 10/11/2022 INDICATION: Hydronephrosis in the setting of inflammatory bowel process and distal ureteral obstruction EXAM: Placement of percutaneous nephrostomy tube using ultrasound and fluoroscopic guidance COMPARISON:  None Available. MEDICATIONS: Documented in the EMR ANESTHESIA/SEDATION: Moderate (conscious) sedation was employed during this procedure. A total of Versed 0.5 mg and Fentanyl 25 mcg was administered intravenously. Moderate Sedation Time: 30 minutes. The patient's level of  consciousness and vital signs were monitored continuously by radiology nursing throughout the procedure under my direct supervision. CONTRAST:  30 mL-administered into the collecting system(s) FLUOROSCOPY TIME:  Fluoroscopy Time: 5.8 minutes (21 mGy) COMPLICATIONS: None immediate. PROCEDURE: Informed written consent was obtained from the patient after a thorough discussion of the procedural risks, benefits and alternatives. All questions were addressed. Maximal Sterile Barrier Technique was utilized including caps, mask, sterile gowns, sterile gloves, sterile drape, hand hygiene and skin antiseptic. A timeout was performed prior to the initiation of the procedure. The patient was placed prone on the exam table. The left flank was prepped and draped in the standard sterile fashion. Ultrasound was used to evaluate the left kidney, which demonstrated hydronephrosis. Skin entry site was marked, and local analgesia was obtained with 1% lidocaine. Using ultrasound guidance, an upper pole calyx was initially selected due to positioning of the ribs and high position of the splenic flexure of the colon based on recent CT and visualization on ultrasound. Puncture was obtained using a 21-gauge Chiba needle. Entry into the collecting system was confirmed with return of urine, and gentle injection of contrast material under fluoroscopy opacifying the proximal collecting system. An 018 wire was advanced through the needle into the renal pelvis  and proximal ureter, followed by placement of a transition dilator. An antegrade nephrostogram was then performed, which at this point demonstrated extra ureteral contrast material. The transitional dilator was withdrawn over wire in attempt to opacify the proximal collecting system, which was unsuccessful. The initial access was removed, and the decision was made to proceed with a second attempt at access. Again using ultrasound guidance, this time a lower pole calyx was identified as safe  for percutaneous access. While maintaining visualization of the splenic flexure as best possible on ultrasound, access was obtained into the proximal collecting system using a 21 gauge Chiba needle. Entry into the collecting system was again confirmed with return of urine and gentle injection of contrast material opacifying the proximal collecting system. An 018 wire was advanced through the needle into the renal pelvis and proximal ureter, followed by placement of a transition dilator. An antegrade nephrostogram was then performed, this time demonstrating appropriate location in the proximal collecting system. Over an 035 Amplatz wire, the percutaneous tract was serially dilated, and a 10.2 French nephrostomy tube was advanced with the loop formed in the renal pelvis. Appropriate positioning of the nephrostomy tube was confirmed with injection of contrast material. The nephrostomy tube was secured to skin using silk suture and a dressing. It was attached to bag drainage. The patient tolerated the procedure well without immediate complication. IMPRESSION: Successful placement of a left-sided 10 French percutaneous nephrostomy tube. Nephrostomy tube placed to bag drainage. Electronically Signed   By: Olive Bass M.D.   On: 10/11/2022 16:19    Assessment and Plan  [name] is a 89 y.o. female with medical history significant of hypothyroidism, thyroid cancer status post partial thyroidectomy, essential hypertension not on any home medications, C. difficile colitis, and GERD.  She presents to [facility] ED after getting a call from her PCP who ordered an abdominal  CT abdomen pelvis shows enterocolonic and colocolonic fistulas with associated inflammatory changes, cystitis with suspected colovesical fistula, and moderate to severe left hydroureteronephrosis  patient complained of abdominal pain on an offer last two months.  Sepsis with Intra-abdominal Source Multiple Colonic Fistulas  Acute  Cystitis/left severe hydronephrosis --HR 110, WBC 22.2;  --CT Abdomen Pelvis shows enterocolonic and colocolonic fistulas with associated inflammatory changes, No fluid collection/abscess, no free air. 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 - Continue Rocephin and Flagyl - IV Fluids: LR at 75 mL/h -- urine culture-- >100K  gram-negative rods -- blood culture negative - Follow Fever curve - General Surgery consult-- appreciated. Dr. Elenor Legato spoke with patient and family. Tentatively placed on for surgery on Friday. - Gastroenterology consulted for multiple colonic fistulas concerning for inflammatory bowel process, appreciate their consultation and recommendations-- G.I. signed off. - Urology consulted for left hydroureteronephrosis --10/2 -- s/p left nephrostomy drain placed with interventional radiology   Acute Kidney Injury due to external Obstructive Uropathy --Likely post renal; moderate to severe (L) hydroureteronephrosis related to compression by inflammatory changes in bowel seen on CT Abdomen/Pelvis - IVF as above - Treat infection as above - Avoid nephrotoxic meds --as above   Normocytic Anemia --stable   Thrombocytosis Appears chronic in nature, PLT was 463 on 06/19/22 in the [facility] system   Hypothyroidism  Hx of Thyroid Cancer s/p Partial Thyroidectomy -Continue home Levothyroxine   Essential Hypertension - Not on any outpatient medication   GERD - Protonix  H/o RA --on methotrexate and folic acid --follows with Dr Allena Katz at [facility]  Procedures:Left Nephrostomy drain + on 10/2  Family communication :Brother and SIL Consults : G.I., general surgery, urology CODE STATUS: full DVT Prophylaxis :Lovenox Level of care: Telemetry Medical Status is: Inpatient Remains inpatient appropriate because: sepsis due to colonic fistula and UTI    TOTAL TIME TAKING CARE OF THIS PATIENT: 35 minutes.  >50%  time spent on counselling and coordination of care  Note: This dictation was prepared with Dragon dictation along with smaller phrase technology. Any transcriptional errors that result from this process are unintentional.  Enedina Finner M.D    [facility]   CC: Primary care physician; Danella Penton, MD

## 2022-10-12 NOTE — Progress Notes (Signed)
[facility] SURGICAL PROGRESS NOTE (cpt [id number])  Hospital Day(s): 1.   Interval History: Patient seen and examined, no acute events or new complaints overnight. Patient reports she is doing well this morning. Anxious regarding surgery. No significant abdominal pain, fever, chills, nausea, emesis. Leukocytosis slightly worse this AM; now 14.9K. Hgb to 10.3 which is stable. Renal function improving; sCr - 0.99; UO - 325 ccs (PCN). She did undergo left PCN with IR yesterday (10/02). She is on CLD. She continues on Rocephin/Flagyl. Plan for Hartman's tomorrow.   Review of Systems:  Constitutional: denies fever, chills  HEENT: denies cough or congestion  Respiratory: denies any shortness of breath  Cardiovascular: denies chest pain or palpitations  Gastrointestinal: denies abdominal pain, N/V Genitourinary: denies burning with urination or urinary frequency Musculoskeletal: denies pain, decreased motor or sensation  Vital signs in last 24 hours: [min-max] current  Temp:  [98 F (36.7 C)-100.1 F (37.8 C)] 100.1 F (37.8 C) (10/03 0400) Pulse Rate:  [88-110] 106 (10/03 0400) Resp:  [15-20] 18 (10/03 0400) BP: (130-175)/(72-90) 142/83 (10/03 0400) SpO2:  [92 %-100 %] 97 % (10/03 0400)     Height: 5\' 2"  (157.5 cm) Weight: 53.5 kg BMI (Calculated): 21.57   Intake/Output last 2 shifts:  10/02 0701 - 10/03 0700 In: -  Out: 325 [Urine:325]   Physical Exam:  Constitutional: alert, cooperative and no distress  HENT: normocephalic without obvious abnormality  Eyes: PERRL, EOM's grossly intact and symmetric  Respiratory: breathing non-labored at rest  Cardiovascular: regular rate and sinus rhythm  Gastrointestinal: soft, lower abdomen remains distended; she is not overtly tender this AM, no rebound/guarding. She is not peritonitic. Left nephrostomy tube; good UO Musculoskeletal: no edema or wounds, motor and sensation grossly intact, NT    Labs:     Latest Ref Rng & Units  10/12/2022    3:20 AM 10/11/2022    5:11 AM 10/10/2022    9:34 AM  CBC  WBC 4.0 - 10.5 K/uL 14.9  12.2  22.2   Hemoglobin 12.0 - 15.0 g/dL 56.2  13.0  86.5   Hematocrit 36.0 - 46.0 % 32.3  30.7  32.8   Platelets 150 - 400 K/uL 388  377  498       Latest Ref Rng & Units 10/12/2022    3:20 AM 10/11/2022    5:11 AM 10/10/2022    9:34 AM  CMP  Glucose 70 - 99 mg/dL 784  87  696   BUN 8 - 23 mg/dL 19  24  28    Creatinine 0.44 - 1.00 mg/dL 2.95  2.84  1.32   Sodium 135 - 145 mmol/L 137  138  133   Potassium 3.5 - 5.1 mmol/L 3.7  3.1  3.7   Chloride 98 - 111 mmol/L 106  106  97   CO2 22 - 32 mmol/L 21  21  23    Calcium 8.9 - 10.3 mg/dL 7.9  7.9  8.6   Total Protein 6.5 - 8.1 g/dL   7.6   Total Bilirubin 0.3 - 1.2 mg/dL   0.7   Alkaline Phos 38 - 126 U/L   86   AST 15 - 41 U/L   13   ALT 0 - 44 U/L   14     Imaging studies: No new pertinent imaging studies   Assessment/Plan: (ICD-10's: K36.2) 89 y.o. 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   - Will type and screen; hold 2 units pRBC tomorrow for OR out of precaution  - Will proceed with bowel prep; PO Neomycin/Flagyl, GoLightly  - Okay CLD for today; NPO at midnight  - IV Abx (Zosyn) - Monitor abdominal examination; on-going bowel function   - Pain control prn; antiemetics prn  - Monitor leukocytosis - Mobilize as tolerated - Further management per primary service; we will follow    All of the above  findings and recommendations were discussed with the patient, and the medical team, and all of patient's questions were answered to her expressed satisfaction.  -- Lynden Oxford, PA-C [facility] 10/12/2022, 6:57 AM M-F: 7am - 4pm

## 2022-10-12 NOTE — Progress Notes (Signed)
Urology Consult Follow Up  Subjective: Patient resting comfortably.  Denies any nephrostomy tube site pain.  Nephrostomy draining clear yellow urine.  Afebrile, but tachy  WBC count up to 14.9 from 12.2 yesterday.  Serum creatinine has normalized from 1.08 yesterday to 0.99 this morning.  Good urine output from the nephrostomy tube.  Urine culture is positive for Klebsiella pneumonia.  She is currently on culture appropriate antibiotics.   Anti-infectives: Anti-infectives (From admission, onward)    Start     Dose/Rate Route Frequency Ordered Stop   10/12/22 0830  metroNIDAZOLE (FLAGYL) tablet 500 mg        500 mg Oral Every 6 hours 10/12/22 0724     10/11/22 1800  neomycin (MYCIFRADIN) tablet 1,000 mg        1,000 mg Oral Every 6 hours 10/11/22 1703     10/11/22 1700  piperacillin-tazobactam (ZOSYN) IVPB 3.375 g        3.375 g 12.5 mL/hr over 240 Minutes Intravenous Every 8 hours 10/11/22 1649     10/11/22 1000  cefTRIAXone (ROCEPHIN) 2 g in sodium chloride 0.9 % 100 mL IVPB  Status:  Discontinued        2 g 200 mL/hr over 30 Minutes Intravenous Every 24 hours 10/10/22 1157 10/11/22 1648   10/10/22 2200  metroNIDAZOLE (FLAGYL) IVPB 500 mg  Status:  Discontinued        500 mg 100 mL/hr over 60 Minutes Intravenous Every 12 hours 10/10/22 1157 10/11/22 1648   10/10/22 1045  cefTRIAXone (ROCEPHIN) 2 g in sodium chloride 0.9 % 100 mL IVPB        2 g 200 mL/hr over 30 Minutes Intravenous  Once 10/10/22 1032 10/10/22 1159   10/10/22 1045  metroNIDAZOLE (FLAGYL) IVPB 500 mg        500 mg 100 mL/hr over 60 Minutes Intravenous  Once 10/10/22 1032 10/10/22 1315   10/10/22 1030  piperacillin-tazobactam (ZOSYN) IVPB 3.375 g  Status:  Discontinued        3.375 g 100 mL/hr over 30 Minutes Intravenous  Once 10/10/22 1016 10/10/22 1032       Current Facility-Administered Medications  Medication Dose Route Frequency Provider Last Rate Last Admin   acetaminophen (TYLENOL) tablet 650 mg  650  mg Oral Q6H PRN Foust, Katy L, NP   650 mg at 10/11/22 1718   Or   acetaminophen (TYLENOL) suppository 650 mg  650 mg Rectal Q6H PRN Foust, Katy L, NP       enoxaparin (LOVENOX) injection 30 mg  30 mg Subcutaneous Q24H Enedina Finner, MD       lactated ringers infusion   Intravenous Continuous Foust, Katy L, NP 75 mL/hr at 10/10/22 1318 New Bag at 10/10/22 1318   [START ON 10/14/2022] levothyroxine (SYNTHROID) tablet 44 mcg  44 mcg Oral Weekly Lowella Bandy, RPH       levothyroxine (SYNTHROID) tablet 88 mcg  88 mcg Oral Once per day on Sunday Monday Tuesday Wednesday Thursday Friday Lowella Bandy, RPH   88 mcg at 10/12/22 0518   melatonin tablet 2.5 mg  2.5 mg Oral QHS PRN Foust, Katy L, NP       metroNIDAZOLE (FLAGYL) tablet 500 mg  500 mg Oral Q6H Pabon, Diego F, MD       neomycin (MYCIFRADIN) tablet 1,000 mg  1,000 mg Oral Q6H Pabon, Diego F, MD   1,000 mg at 10/12/22 0518   ondansetron (ZOFRAN) tablet 4 mg  4 mg Oral Q6H  PRN Foust, Lanney Gins, NP       Or   ondansetron (ZOFRAN) injection 4 mg  4 mg Intravenous Q6H PRN Foust, Katy L, NP       pantoprazole (PROTONIX) EC tablet 40 mg  40 mg Oral Daily Foust, Katy L, NP   40 mg at 10/11/22 0952   piperacillin-tazobactam (ZOSYN) IVPB 3.375 g  3.375 g Intravenous Q8H Barrie Folk, RPH 12.5 mL/hr at 10/12/22 0118 3.375 g at 10/12/22 0118   polyethylene glycol-electrolytes (NuLYTELY) solution 4,000 mL  4,000 mL Oral Once Pabon, Diego F, MD         Objective: Vital signs in last 24 hours: Temp:  [98 F (36.7 C)-100.1 F (37.8 C)] 98.6 F (37 C) (10/03 0804) Pulse Rate:  [88-110] 101 (10/03 0804) Resp:  [15-20] 20 (10/03 0804) BP: (130-175)/(72-90) 134/78 (10/03 0804) SpO2:  [92 %-100 %] 97 % (10/03 0804)  Intake/Output from previous day: 10/02 0701 - 10/03 0700 In: -  Out: 325 [Urine:325] Intake/Output this shift: No intake/output data recorded.   Physical Exam Vitals and nursing note reviewed.  Constitutional:      Appearance:  She is well-developed and normal weight.  HENT:     Head: Normocephalic and atraumatic.  Eyes:     Extraocular Movements: Extraocular movements intact.     Pupils: Pupils are equal, round, and reactive to light.  Pulmonary:     Effort: Pulmonary effort is normal.  Abdominal:     General: Abdomen is flat.     Palpations: Abdomen is soft.  Genitourinary:    Comments: Nephrostomy tube in place, bandages are clean and dry.  Good urine output with clear yellow urine. Skin:    General: Skin is warm.  Neurological:     General: No focal deficit present.     Mental Status: She is alert.  Psychiatric:        Mood and Affect: Mood normal.     Lab Results:  Recent Labs    10/11/22 0511 10/12/22 0320  WBC 12.2* 14.9*  HGB 10.0* 10.3*  HCT 30.7* 32.3*  PLT 377 388   BMET Recent Labs    10/11/22 0511 10/12/22 0320  NA 138 137  K 3.1* 3.7  CL 106 106  CO2 21* 21*  GLUCOSE 87 102*  BUN 24* 19  CREATININE 1.08* 0.99  CALCIUM 7.9* 7.9*   PT/INR Recent Labs    10/11/22 0511  LABPROT 16.0*  INR 1.3*   ABG No results for input(s): "PHART", "HCO3" in the last 72 hours.  Invalid input(s): "PCO2", "PO2"  Studies/Results: IR NEPHROSTOMY PLACEMENT LEFT  Result Date: 10/11/2022 INDICATION: Hydronephrosis in the setting of inflammatory bowel process and distal ureteral obstruction EXAM: Placement of percutaneous nephrostomy tube using ultrasound and fluoroscopic guidance COMPARISON:  None Available. MEDICATIONS: Documented in the EMR ANESTHESIA/SEDATION: Moderate (conscious) sedation was employed during this procedure. A total of Versed 0.5 mg and Fentanyl 25 mcg was administered intravenously. Moderate Sedation Time: 30 minutes. The patient's level of consciousness and vital signs were monitored continuously by radiology nursing throughout the procedure under my direct supervision. CONTRAST:  30 mL-administered into the collecting system(s) FLUOROSCOPY TIME:  Fluoroscopy Time: 5.8  minutes (21 mGy) COMPLICATIONS: None immediate. PROCEDURE: Informed written consent was obtained from the patient after a thorough discussion of the procedural risks, benefits and alternatives. All questions were addressed. Maximal Sterile Barrier Technique was utilized including caps, mask, sterile gowns, sterile gloves, sterile drape, hand hygiene and skin antiseptic. A  timeout was performed prior to the initiation of the procedure. The patient was placed prone on the exam table. The left flank was prepped and draped in the standard sterile fashion. Ultrasound was used to evaluate the left kidney, which demonstrated hydronephrosis. Skin entry site was marked, and local analgesia was obtained with 1% lidocaine. Using ultrasound guidance, an upper pole calyx was initially selected due to positioning of the ribs and high position of the splenic flexure of the colon based on recent CT and visualization on ultrasound. Puncture was obtained using a 21-gauge Chiba needle. Entry into the collecting system was confirmed with return of urine, and gentle injection of contrast material under fluoroscopy opacifying the proximal collecting system. An 018 wire was advanced through the needle into the renal pelvis and proximal ureter, followed by placement of a transition dilator. An antegrade nephrostogram was then performed, which at this point demonstrated extra ureteral contrast material. The transitional dilator was withdrawn over wire in attempt to opacify the proximal collecting system, which was unsuccessful. The initial access was removed, and the decision was made to proceed with a second attempt at access. Again using ultrasound guidance, this time a lower pole calyx was identified as safe for percutaneous access. While maintaining visualization of the splenic flexure as best possible on ultrasound, access was obtained into the proximal collecting system using a 21 gauge Chiba needle. Entry into the collecting system  was again confirmed with return of urine and gentle injection of contrast material opacifying the proximal collecting system. An 018 wire was advanced through the needle into the renal pelvis and proximal ureter, followed by placement of a transition dilator. An antegrade nephrostogram was then performed, this time demonstrating appropriate location in the proximal collecting system. Over an 035 Amplatz wire, the percutaneous tract was serially dilated, and a 10.2 French nephrostomy tube was advanced with the loop formed in the renal pelvis. Appropriate positioning of the nephrostomy tube was confirmed with injection of contrast material. The nephrostomy tube was secured to skin using silk suture and a dressing. It was attached to bag drainage. The patient tolerated the procedure well without immediate complication. IMPRESSION: Successful placement of a left-sided 10 French percutaneous nephrostomy tube. Nephrostomy tube placed to bag drainage. Electronically Signed   By: Olive Bass M.D.   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  -Nephrostomy tube in place with good urine output of clear yellow urine  -Serum creatinine has normalized to 0.99 this morning  Plan: -Patient scheduled for exploratory laparotomy and colostomy creation -Continue culture appropriate antibiotics for UTI -Explained nephrostomy tube care to patient and advised her that it will need to be in place for at least 6 to 8 weeks and at that time we will need to decide whether it needs to be exchanged, we can attempt internalization of the stent or discontinue it altogether -She is in agreement with that plan   LOS: 1 day    [facility] 10/12/2022

## 2022-10-13 ENCOUNTER — Inpatient Hospital Stay: Payer: Medicare Other | Admitting: Anesthesiology

## 2022-10-13 ENCOUNTER — Encounter
Admission: EM | Disposition: A | Discharge status: Home / Self Care | Payer: Self-pay | Source: Home / Self Care | Attending: Internal Medicine

## 2022-10-13 ENCOUNTER — Telehealth: Payer: Self-pay | Admitting: Urology

## 2022-10-13 ENCOUNTER — Encounter: Payer: Self-pay | Admitting: Internal Medicine

## 2022-10-13 ENCOUNTER — Other Ambulatory Visit: Payer: Self-pay

## 2022-10-13 DIAGNOSIS — A419 Sepsis, unspecified organism: Secondary | ICD-10-CM | POA: Diagnosis not present

## 2022-10-13 DIAGNOSIS — N179 Acute kidney failure, unspecified: Secondary | ICD-10-CM | POA: Diagnosis not present

## 2022-10-13 DIAGNOSIS — K5732 Diverticulitis of large intestine without perforation or abscess without bleeding: Secondary | ICD-10-CM | POA: Diagnosis not present

## 2022-10-13 DIAGNOSIS — R6520 Severe sepsis without septic shock: Secondary | ICD-10-CM | POA: Diagnosis not present

## 2022-10-13 DIAGNOSIS — K632 Fistula of intestine: Secondary | ICD-10-CM | POA: Diagnosis not present

## 2022-10-13 HISTORY — PX: COLECTOMY WITH COLOSTOMY CREATION/HARTMANN PROCEDURE: SHX6598

## 2022-10-13 LAB — BPAM RBC
Blood Product Expiration Date: 202410052359
Blood Product Expiration Date: 202410262359
Blood Product Expiration Date: 202411052359
ISSUE DATE / TIME: 202410041156
Unit Type and Rh: 600
Unit Type and Rh: 6200
Unit Type and Rh: 6200

## 2022-10-13 LAB — TYPE AND SCREEN
ABO/RH(D): A POS
Antibody Screen: NEGATIVE
Unit division: 0
Unit division: 0
Unit division: 0

## 2022-10-13 LAB — PREPARE RBC (CROSSMATCH)

## 2022-10-13 SURGERY — COLECTOMY, WITH COLOSTOMY CREATION
Anesthesia: General | Site: Abdomen

## 2022-10-13 MED ORDER — ONDANSETRON HCL 4 MG/2ML IJ SOLN: 4.0000 mg | Freq: Once | INTRAMUSCULAR | Status: DC | PRN

## 2022-10-13 MED ORDER — DEXAMETHASONE SODIUM PHOSPHATE 10 MG/ML IJ SOLN
INTRAMUSCULAR | Status: DC | PRN
  Administered 2022-10-13: 10 mg via INTRAVENOUS

## 2022-10-13 MED ORDER — PHENYLEPHRINE HCL-NACL 20-0.9 MG/250ML-% IV SOLN
INTRAVENOUS | Status: DC | PRN
Start: 2022-10-13 — End: 2022-10-13
  Administered 2022-10-13: 25 ug/min via INTRAVENOUS

## 2022-10-13 MED ORDER — SUGAMMADEX SODIUM 200 MG/2ML IV SOLN
INTRAVENOUS | Status: DC | PRN
  Administered 2022-10-13: 240 mg via INTRAVENOUS

## 2022-10-13 MED ORDER — HYDROMORPHONE HCL 1 MG/ML IJ SOLN
INTRAMUSCULAR | Status: AC
  Filled 2022-10-13: qty 1

## 2022-10-13 MED ORDER — SUCCINYLCHOLINE CHLORIDE 200 MG/10ML IV SOSY
PREFILLED_SYRINGE | INTRAVENOUS | Status: DC | PRN
  Administered 2022-10-13: 80 mg via INTRAVENOUS

## 2022-10-13 MED ORDER — IRRISEPT - 450ML BOTTLE WITH 0.05% CHG IN STERILE WATER, USP 99.95% OPTIME
TOPICAL | Status: DC | PRN
Start: 2022-10-13 — End: 2022-10-13
  Administered 2022-10-13: 450 mL

## 2022-10-13 MED ORDER — ONDANSETRON HCL 4 MG/2ML IJ SOLN
INTRAMUSCULAR | Status: DC | PRN
  Administered 2022-10-13 (×2): 4 mg via INTRAVENOUS

## 2022-10-13 MED ORDER — OXYCODONE HCL 5 MG PO TABS: 5.0000 mg | ORAL_TABLET | Freq: Once | ORAL | Status: DC | PRN

## 2022-10-13 MED ORDER — SODIUM CHLORIDE (PF) 0.9 % IJ SOLN
INTRAMUSCULAR | Status: AC
  Filled 2022-10-13: qty 100

## 2022-10-13 MED ORDER — PROPOFOL 10 MG/ML IV BOLUS
INTRAVENOUS | Status: DC | PRN
  Administered 2022-10-13: 120 mg via INTRAVENOUS

## 2022-10-13 MED ORDER — ACETAMINOPHEN 500 MG PO TABS
1000.0000 mg | ORAL_TABLET | Freq: Four times a day (QID) | ORAL | Status: DC
  Administered 2022-10-13 – 2022-10-17 (×14): 1000 mg via ORAL
  Filled 2022-10-13 (×17): qty 2

## 2022-10-13 MED ORDER — METHYLENE BLUE (ANTIDOTE) 1 % IV SOLN
INTRAVENOUS | Status: AC
  Filled 2022-10-13: qty 10

## 2022-10-13 MED ORDER — GLYCOPYRROLATE 0.2 MG/ML IJ SOLN
INTRAMUSCULAR | Status: DC | PRN
  Administered 2022-10-13: .2 mg via INTRAVENOUS

## 2022-10-13 MED ORDER — PROPOFOL 1000 MG/100ML IV EMUL
INTRAVENOUS | Status: AC
  Filled 2022-10-13: qty 100

## 2022-10-13 MED ORDER — HYDROMORPHONE HCL 1 MG/ML IJ SOLN
INTRAMUSCULAR | Status: DC | PRN
Start: 2022-10-13 — End: 2022-10-13
  Administered 2022-10-13: 1 mg via INTRAVENOUS

## 2022-10-13 MED ORDER — ALBUMIN HUMAN 5 % IV SOLN
INTRAVENOUS | Status: AC
  Filled 2022-10-13: qty 250

## 2022-10-13 MED ORDER — SODIUM CHLORIDE (PF) 0.9 % IJ SOLN
INTRAMUSCULAR | Status: DC | PRN
  Administered 2022-10-13: 100 mL via INTRAMUSCULAR

## 2022-10-13 MED ORDER — 0.9 % SODIUM CHLORIDE (POUR BTL) OPTIME
TOPICAL | Status: DC | PRN
  Administered 2022-10-13: 1000 mL

## 2022-10-13 MED ORDER — ACETAMINOPHEN 10 MG/ML IV SOLN
INTRAVENOUS | Status: DC | PRN
  Administered 2022-10-13: 1000 mg via INTRAVENOUS

## 2022-10-13 MED ORDER — BUPIVACAINE LIPOSOME 1.3 % IJ SUSP: INTRAMUSCULAR | Status: DC | PRN

## 2022-10-13 MED ORDER — PIPERACILLIN-TAZOBACTAM 3.375 G IVPB
INTRAVENOUS | Status: AC
  Filled 2022-10-13: qty 50

## 2022-10-13 MED ORDER — SODIUM CHLORIDE (PF) 0.9 % IJ SOLN
INTRAMUSCULAR | Status: AC
  Filled 2022-10-13: qty 50

## 2022-10-13 MED ORDER — OXYCODONE HCL 5 MG PO TABS: 5.0000 mg | ORAL_TABLET | ORAL | Status: DC | PRN

## 2022-10-13 MED ORDER — ROCURONIUM BROMIDE 100 MG/10ML IV SOLN
INTRAVENOUS | Status: DC | PRN
  Administered 2022-10-13: 50 mg via INTRAVENOUS

## 2022-10-13 MED ORDER — MORPHINE SULFATE (PF) 2 MG/ML IV SOLN: 2.0000 mg | INTRAVENOUS | Status: DC | PRN

## 2022-10-13 MED ORDER — FENTANYL CITRATE (PF) 100 MCG/2ML IJ SOLN: 25.0000 ug | INTRAMUSCULAR | Status: DC | PRN

## 2022-10-13 MED ORDER — FENTANYL CITRATE (PF) 100 MCG/2ML IJ SOLN
INTRAMUSCULAR | Status: AC
  Filled 2022-10-13: qty 2

## 2022-10-13 MED ORDER — PROPOFOL 10 MG/ML IV BOLUS
INTRAVENOUS | Status: AC
  Filled 2022-10-13: qty 20

## 2022-10-13 MED ORDER — PHENYLEPHRINE 80 MCG/ML (10ML) SYRINGE FOR IV PUSH (FOR BLOOD PRESSURE SUPPORT)
PREFILLED_SYRINGE | INTRAVENOUS | Status: DC | PRN
  Administered 2022-10-13 (×3): 80 ug via INTRAVENOUS

## 2022-10-13 MED ORDER — BUPIVACAINE LIPOSOME 1.3 % IJ SUSP
INTRAMUSCULAR | Status: AC
  Filled 2022-10-13: qty 20

## 2022-10-13 MED ORDER — ACETAMINOPHEN 10 MG/ML IV SOLN: 1000.0000 mg | Freq: Once | INTRAVENOUS | Status: DC | PRN

## 2022-10-13 MED ORDER — SODIUM CHLORIDE 0.9% IV SOLUTION: Freq: Once | INTRAVENOUS | Status: DC

## 2022-10-13 MED ORDER — ALBUMIN HUMAN 5 % IV SOLN
INTRAVENOUS | Status: DC | PRN
Start: 2022-10-13 — End: 2022-10-13

## 2022-10-13 MED ORDER — LIDOCAINE HCL (CARDIAC) PF 100 MG/5ML IV SOSY
PREFILLED_SYRINGE | INTRAVENOUS | Status: DC | PRN
  Administered 2022-10-13: 60 mg via INTRAVENOUS

## 2022-10-13 MED ORDER — BUPIVACAINE HCL (PF) 0.25 % IJ SOLN
INTRAMUSCULAR | Status: AC
  Filled 2022-10-13: qty 30

## 2022-10-13 MED ORDER — OXYCODONE HCL 5 MG/5ML PO SOLN: 5.0000 mg | Freq: Once | ORAL | Status: DC | PRN

## 2022-10-13 MED ORDER — FENTANYL CITRATE (PF) 100 MCG/2ML IJ SOLN
INTRAMUSCULAR | Status: DC | PRN
  Administered 2022-10-13 (×2): 50 ug via INTRAVENOUS

## 2022-10-13 SURGICAL SUPPLY — 66 items
ADH LQ OCL WTPRF AMP STRL LF (MISCELLANEOUS) ×1
ADHESIVE MASTISOL STRL (MISCELLANEOUS) IMPLANT
APL PRP STRL LF DISP 70% ISPRP (MISCELLANEOUS) ×1
BARRIER ADH SEPRAFILM 3INX5IN (MISCELLANEOUS) IMPLANT
BRR ADH 5X3 SEPRAFILM 2 SHT (MISCELLANEOUS) ×2
CATH FOL 3WAY LX 18X30 (CATHETERS) IMPLANT
CHLORAPREP W/TINT 26 (MISCELLANEOUS) ×1 IMPLANT
DRAIN CHANNEL JP 19F (MISCELLANEOUS) IMPLANT
DRAIN PENROSE 12X.25 LTX STRL (MISCELLANEOUS) IMPLANT
DRAPE LAPAROTOMY 100X77 ABD (DRAPES) ×1 IMPLANT
DRAPE WARM FLUID 44X44 (DRAPES) IMPLANT
DRSG OPSITE POSTOP 4X12 (GAUZE/BANDAGES/DRESSINGS) IMPLANT
DRSG TEGADERM 4X4.75 (GAUZE/BANDAGES/DRESSINGS) IMPLANT
ELECT BLADE 6.5 EXT (BLADE) IMPLANT
ELECT CAUTERY BLADE 6.4 (BLADE) ×1 IMPLANT
ELECT EZSTD 165MM 6.5IN (MISCELLANEOUS) ×1
ELECT REM PT RETURN 9FT ADLT (ELECTROSURGICAL) ×1
ELECTRODE EZSTD 165MM 6.5IN (MISCELLANEOUS) ×1 IMPLANT
ELECTRODE REM PT RTRN 9FT ADLT (ELECTROSURGICAL) ×1 IMPLANT
EVACUATOR SILICONE 100CC (DRAIN) IMPLANT
GAUZE 4X4 16PLY ~~LOC~~+RFID DBL (SPONGE) ×1 IMPLANT
GAUZE SPONGE 4X4 12PLY STRL (GAUZE/BANDAGES/DRESSINGS) ×1 IMPLANT
GLOVE BIO SURGEON STRL SZ7 (GLOVE) ×2 IMPLANT
GOWN STRL REUS W/ TWL LRG LVL3 (GOWN DISPOSABLE) ×4 IMPLANT
GOWN STRL REUS W/TWL LRG LVL3 (GOWN DISPOSABLE) ×4
JET LAVAGE IRRISEPT WOUND (IRRIGATION / IRRIGATOR) ×1
KIT OSTOMY 2 PC DRNBL 2.25 STR (WOUND CARE) IMPLANT
KIT OSTOMY DRAINABLE 2.25 STR (WOUND CARE) ×1
KIT TURNOVER KIT A (KITS) ×1 IMPLANT
LABEL OR SOLS (LABEL) ×1 IMPLANT
LAVAGE JET IRRISEPT WOUND (IRRIGATION / IRRIGATOR) IMPLANT
LIGASURE IMPACT 36 18CM CVD LR (INSTRUMENTS) ×1 IMPLANT
MANIFOLD NEPTUNE II (INSTRUMENTS) ×1 IMPLANT
NDL HYPO 22X1.5 SAFETY MO (MISCELLANEOUS) ×1 IMPLANT
NEEDLE HYPO 22X1.5 SAFETY MO (MISCELLANEOUS) ×1 IMPLANT
NS IRRIG 1000ML POUR BTL (IV SOLUTION) ×1 IMPLANT
PACK BASIN MAJOR ARMC (MISCELLANEOUS) ×1 IMPLANT
PACK COLON CLEAN CLOSURE (MISCELLANEOUS) ×1 IMPLANT
PLUG CATH AND CAP STER (CATHETERS) IMPLANT
RELOAD PROXIMATE 75MM BLUE (ENDOMECHANICALS) ×2 IMPLANT
RELOAD STAPLE 75 3.8 BLU REG (ENDOMECHANICALS) IMPLANT
SPIKE FLUID TRANSFER (MISCELLANEOUS) ×1 IMPLANT
SPONGE DRAIN TRACH 4X4 STRL 2S (GAUZE/BANDAGES/DRESSINGS) IMPLANT
SPONGE T-LAP 18X18 ~~LOC~~+RFID (SPONGE) ×5 IMPLANT
STAPLER CVD CUT BL 40 RELOAD (ENDOMECHANICALS) ×1 IMPLANT
STAPLER CVD CUT BLU 40 RELOAD (ENDOMECHANICALS) ×1 IMPLANT
STAPLER CVD CUT GN 40 RELOAD (ENDOMECHANICALS) ×1 IMPLANT
STAPLER CVD CUT GRN 40 RELOAD (ENDOMECHANICALS) IMPLANT
STAPLER PROXIMATE 75MM BLUE (STAPLE) IMPLANT
STAPLER SKIN PROX 35W (STAPLE) ×1 IMPLANT
SUT ETHILON 3-0 FS-10 30 BLK (SUTURE) ×1
SUT PDS AB 0 CT1 27 (SUTURE) ×3 IMPLANT
SUT SILK 2 0 (SUTURE) ×1
SUT SILK 2 0SH CR/8 30 (SUTURE) ×1 IMPLANT
SUT SILK 2-0 18XBRD TIE 12 (SUTURE) ×1 IMPLANT
SUT VIC AB 0 CT1 36 (SUTURE) IMPLANT
SUT VIC AB 2-0 CT1 (SUTURE) IMPLANT
SUT VIC AB 3-0 SH 27 (SUTURE) ×5
SUT VIC AB 3-0 SH 27X BRD (SUTURE) ×1 IMPLANT
SUTURE EHLN 3-0 FS-10 30 BLK (SUTURE) IMPLANT
SYR 20ML LL LF (SYRINGE) ×1 IMPLANT
SYR 30ML LL (SYRINGE) IMPLANT
TRAP FLUID SMOKE EVACUATOR (MISCELLANEOUS) ×1 IMPLANT
TRAY FOLEY MTR SLVR 16FR STAT (SET/KITS/TRAYS/PACK) ×1 IMPLANT
WATER STERILE IRR 1000ML POUR (IV SOLUTION) ×1 IMPLANT
WATER STERILE IRR 500ML POUR (IV SOLUTION) ×1 IMPLANT

## 2022-10-13 NOTE — Anesthesia Procedure Notes (Signed)
Procedure Name: Intubation Date/Time: 10/13/2022 9:11 AM  Performed by: Mohammed Kindle, CRNAPre-anesthesia Checklist: Patient identified, Emergency Drugs available, Suction available and Patient being monitored Patient Re-evaluated:Patient Re-evaluated prior to induction Oxygen Delivery Method: Circle system utilized Preoxygenation: Pre-oxygenation with 100% oxygen Induction Type: IV induction Ventilation: Mask ventilation without difficulty Grade View: Grade I Tube type: Oral Tube size: 6.5 mm Number of attempts: 1 Airway Equipment and Method: Stylet Placement Confirmation: ETT inserted through vocal cords under direct vision, positive ETCO2, breath sounds checked- equal and bilateral and CO2 detector Secured at: 21 cm Tube secured with: Tape Dental Injury: Teeth and Oropharynx as per pre-operative assessment

## 2022-10-13 NOTE — Anesthesia Preprocedure Evaluation (Signed)
Anesthesia Evaluation  Patient identified by MRN, date of birth, ID band Patient awake    Reviewed: Allergy & Precautions, NPO status , Patient's Chart, lab work & pertinent test results  History of Anesthesia Complications Negative for: history of anesthetic complications  Airway Mallampati: II  TM Distance: >3 FB Neck ROM: Full    Dental  (+) Poor Dentition, Chipped   Pulmonary neg sleep apnea, neg COPD, former smoker   breath sounds clear to auscultation- rhonchi (-) wheezing      Cardiovascular hypertension, Pt. on medications (-) CAD, (-) Past MI, (-) Cardiac Stents and (-) CABG  Rhythm:Regular Rate:Normal - Systolic murmurs and - Diastolic murmurs    Neuro/Psych neg Seizures negative neurological ROS  negative psych ROS   GI/Hepatic negative GI ROS, Neg liver ROS,,,  Endo/Other  neg diabetesHypothyroidism  Thyroid nodule   Renal/GU negative Renal ROS     Musculoskeletal  (+) Arthritis ,    Abdominal  (+) - obese  Peds  Hematology negative hematology ROS (+)   Anesthesia Other Findings Past Medical History: No date: Arthritis No date: Asthma     Comment:  as a child No date: Thyroid nodule   Reproductive/Obstetrics                             Anesthesia Physical Anesthesia Plan  ASA: 2  Anesthesia Plan: General   Post-op Pain Management: Ofirmev IV (intra-op)*   Induction: Intravenous  PONV Risk Score and Plan: 4 or greater and Ondansetron, Dexamethasone and Treatment may vary due to age or medical condition  Airway Management Planned: Oral ETT and Video Laryngoscope Planned  Additional Equipment: None  Intra-op Plan:   Post-operative Plan: Extubation in OR  Informed Consent: I have reviewed the patients History and Physical, chart, labs and discussed the procedure including the risks, benefits and alternatives for the proposed anesthesia with the patient or  authorized representative who has indicated his/her understanding and acceptance.     Dental advisory given  Plan Discussed with: CRNA and Surgeon  Anesthesia Plan Comments: (Discussed risks of anesthesia with patient and family at bedside, including PONV, sore throat, lip/dental/eye damage, post operative cognitive dysfunction. Rare risks discussed as well, such as cardiorespiratory and neurological sequelae, and allergic reactions. Discussed the role of CRNA in patient's perioperative care. Patient understands.)        Anesthesia Quick Evaluation

## 2022-10-13 NOTE — Telephone Encounter (Signed)
Patient currently hospitalized, but she will need follow-up for her nephrostomy tube with Dr. Lonna Cobb.

## 2022-10-13 NOTE — Progress Notes (Signed)
[facility]  - [place] [facility]   PATIENT NAME: [name]    MR#:  [id number]  DATE OF BIRTH:  31-Mar-1933  SUBJECTIVE:  Pt went for surgical repair today.  Tolerated it well.  VITALS:  Blood pressure 103/63, pulse 82, temperature 97.6 F (36.4 C), temperature source Oral, resp. rate 15, height 5\' 2"  (1.575 m), weight 53 kg, SpO2 94%.  PHYSICAL EXAMINATION:   Constitutional: NAD, AAOx3 HEENT: conjunctivae and lids normal, EOMI CV: No cyanosis.   RESP: normal respiratory effort, on RA Neuro: II - XII grossly intact.   Psych: Normal mood and affect.  Appropriate judgement and reason   LABORATORY PANEL:  CBC Recent Labs  Lab 10/12/22 0320  WBC 14.9*  HGB 10.3*  HCT 32.3*  PLT 388    Chemistries  Recent Labs  Lab 10/10/22 0934 10/11/22 0511 10/12/22 0320  NA 133*   < > 137  K 3.7   < > 3.7  CL 97*   < > 106  CO2 23   < > 21*  GLUCOSE 119*   < > 102*  BUN 28*   < > 19  CREATININE 1.48*   < > 0.99  CALCIUM 8.6*   < > 7.9*  MG  --   --  2.0  AST 13*  --   --   ALT 14  --   --   ALKPHOS 86  --   --   BILITOT 0.7  --   --    < > = values in this interval not displayed.   Cardiac Enzymes No results for input(s): "TROPONINI" in the last 168 hours. RADIOLOGY:  No results found.  Assessment and Plan  [name] is a 89 y.o. female with medical history significant of hypothyroidism, thyroid cancer status post partial thyroidectomy, essential hypertension not on any home medications, C. difficile colitis, and GERD.  She presents to [facility] ED after getting a call from her PCP who ordered an abdominal  CT abdomen pelvis shows enterocolonic and colocolonic fistulas with associated inflammatory changes, cystitis with suspected colovesical fistula, and moderate to severe left hydroureteronephrosis  patient complained of abdominal pain on an offer last two months.  Sepsis with Intra-abdominal Source Multiple Colonic Fistulas  Acute  Cystitis/left severe hydronephrosis --HR 110, WBC 22.2;  --CT Abdomen Pelvis shows enterocolonic and colocolonic fistulas with associated inflammatory changes, No fluid collection/abscess, no free air. 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 -- urine culture-- >100K  gram-negative rods -- blood culture negative - Gastroenterology consulted for multiple colonic fistulas concerning for inflammatory bowel process, appreciate their consultation and recommendations-- G.I. signed off. - Urology consulted for left hydroureteronephrosis --10/2 -- s/p left nephrostomy drain placed with interventional radiology --Surgical repair today --cont Zosyn   Acute Kidney Injury due to external Obstructive Uropathy --Likely post renal; moderate to severe (L) hydroureteronephrosis related to compression by inflammatory changes in bowel seen on CT Abdomen/Pelvis --cont MIVF   Normocytic Anemia --stable   Thrombocytosis Appears chronic in nature, PLT was 463 on 06/19/22 in the [facility] system   Hypothyroidism  Hx of Thyroid Cancer s/p Partial Thyroidectomy -Continue home Levothyroxine   Essential Hypertension - Not on any outpatient medication   GERD - Protonix  H/o RA --on methotrexate and folic acid --follows with Dr Allena Katz at [facility]    Family communication : family updated at bedside today Consults : G.I., general surgery, urology CODE STATUS: full  DVT Prophylaxis :Lovenox Level of care: Telemetry Medical Status is: Inpatient Remains inpatient appropriate because: just post-op, on IV abx    TOTAL TIME TAKING CARE OF THIS PATIENT: 35 minutes.  >50% time spent on counselling and coordination of care   Darlin Priestly M.D    [facility]   CC: Primary care physician; Danella Penton, MD

## 2022-10-13 NOTE — Progress Notes (Cosign Needed)
Urology Consult Follow Up  Subjective: Patient resting comfortably, she denies any nephrostomy tube site pain.  Nephrostomy is draining clear yellow urine.  VSS, afebrile   No new morning labs.  Anti-infectives: Anti-infectives (From admission, onward)    Start     Dose/Rate Route Frequency Ordered Stop   10/12/22 1400  metroNIDAZOLE (FLAGYL) tablet 500 mg        500 mg Oral Every 6 hours 10/12/22 0924 10/12/22 2359   10/12/22 1400  neomycin (MYCIFRADIN) tablet 1,000 mg        1,000 mg Oral Every 6 hours 10/12/22 0924 10/12/22 2359   10/12/22 0830  metroNIDAZOLE (FLAGYL) tablet 500 mg  Status:  Discontinued        500 mg Oral Every 6 hours 10/12/22 0724 10/12/22 0924   10/11/22 1800  neomycin (MYCIFRADIN) tablet 1,000 mg  Status:  Discontinued        1,000 mg Oral Every 6 hours 10/11/22 1703 10/12/22 0924   10/11/22 1700  [MAR Hold]  piperacillin-tazobactam (ZOSYN) IVPB 3.375 g        (MAR Hold since Fri 10/13/2022 at 0810.Hold Reason: Transfer to a Procedural area)   3.375 g 12.5 mL/hr over 240 Minutes Intravenous Every 8 hours 10/11/22 1649     10/11/22 1000  cefTRIAXone (ROCEPHIN) 2 g in sodium chloride 0.9 % 100 mL IVPB  Status:  Discontinued        2 g 200 mL/hr over 30 Minutes Intravenous Every 24 hours 10/10/22 1157 10/11/22 1648   10/10/22 2200  metroNIDAZOLE (FLAGYL) IVPB 500 mg  Status:  Discontinued        500 mg 100 mL/hr over 60 Minutes Intravenous Every 12 hours 10/10/22 1157 10/11/22 1648   10/10/22 1045  cefTRIAXone (ROCEPHIN) 2 g in sodium chloride 0.9 % 100 mL IVPB        2 g 200 mL/hr over 30 Minutes Intravenous  Once 10/10/22 1032 10/10/22 1159   10/10/22 1045  metroNIDAZOLE (FLAGYL) IVPB 500 mg        500 mg 100 mL/hr over 60 Minutes Intravenous  Once 10/10/22 1032 10/10/22 1315   10/10/22 1030  piperacillin-tazobactam (ZOSYN) IVPB 3.375 g  Status:  Discontinued        3.375 g 100 mL/hr over 30 Minutes Intravenous  Once 10/10/22 1016 10/10/22 1032        Current Facility-Administered Medications  Medication Dose Route Frequency Provider Last Rate Last Admin   0.9 %  sodium chloride infusion (Manually program via Guardrails IV Fluids)   Intravenous Once Donovan Kail, PA-C       [facility] Hold] 0.9 %  sodium chloride infusion   Intravenous PRN Enedina Finner, MD 50 mL/hr at 10/13/22 0741 Infusion Verify at 10/13/22 0741   [MAR Hold] acetaminophen (TYLENOL) tablet 650 mg  650 mg Oral Q6H PRN Foust, Katy L, NP   650 mg at 10/11/22 1718   Or   [MAR Hold] acetaminophen (TYLENOL) suppository 650 mg  650 mg Rectal Q6H PRN Foust, Katy L, NP       [MAR Hold] enoxaparin (LOVENOX) injection 30 mg  30 mg Subcutaneous Q24H Enedina Finner, MD   30 mg at 10/12/22 2143   lactated ringers infusion   Intravenous Continuous Sterling Big F, MD 50 mL/hr at 10/13/22 0741 Restarted at 10/13/22 0904   [MAR Hold] levothyroxine (SYNTHROID) tablet 44 mcg  44 mcg Oral Weekly Lowella Bandy, [facility]       [MAR Hold] levothyroxine (SYNTHROID) tablet  88 mcg  88 mcg Oral Once per day on Sunday Monday Tuesday Wednesday Thursday Friday Lowella Bandy, RPH   88 mcg at 10/13/22 0540   [MAR Hold] melatonin tablet 2.5 mg  2.5 mg Oral QHS PRN Foust, Katy L, NP       [MAR Hold] ondansetron (ZOFRAN) tablet 4 mg  4 mg Oral Q6H PRN Foust, Katy L, NP       Or   [MAR Hold] ondansetron (ZOFRAN) injection 4 mg  4 mg Intravenous Q6H PRN Foust, Katy L, NP       [MAR Hold] pantoprazole (PROTONIX) EC tablet 40 mg  40 mg Oral Daily Foust, Katy L, NP   40 mg at 10/12/22 0858   [MAR Hold] piperacillin-tazobactam (ZOSYN) IVPB 3.375 g  3.375 g Intravenous Q8H Barrie Folk, RPH 0 mL/hr at 10/13/22 0511 3.375 g at 10/13/22 1610   Facility-Administered Medications Ordered in Other Encounters  Medication Dose Route Frequency Provider Last Rate Last Admin   dexamethasone (DECADRON) injection   Intravenous Anesthesia Intra-op Fletcher-Harrison, Kathie Rhodes, CRNA   10 mg at 10/13/22 9604   fentaNYL  (SUBLIMAZE) injection   Intravenous Anesthesia Intra-op Mohammed Kindle, CRNA   50 mcg at 10/13/22 0910   glycopyrrolate (ROBINUL) injection   Intravenous Anesthesia Intra-op Mohammed Kindle, CRNA   0.2 mg at 10/13/22 5409   HYDROmorphone (DILAUDID) injection   Intravenous Anesthesia Intra-op Mohammed Kindle, CRNA   1 mg at 10/13/22 0933   lidocaine (cardiac) 100 mg/67mL (XYLOCAINE) injection 2%   Intravenous Anesthesia Intra-op Fletcher-Harrison, Kathie Rhodes, CRNA   60 mg at 10/13/22 0910   ondansetron (ZOFRAN) injection   Intravenous Anesthesia Intra-op Mohammed Kindle, CRNA   4 mg at 10/13/22 8119   phenylephrine (NEO-SYNEPHRINE) 20mg /NS premix infusion   Intravenous Continuous PRN Fletcher-Harrison, Kathie Rhodes, CRNA 18.75 mL/hr at 10/13/22 0918 25 mcg/min at 10/13/22 0918   PHENYLephrine 80 mcg/ml in normal saline Adult IV Push Syringe (For Blood Pressure Support)   Intravenous Anesthesia Intra-op Fletcher-Harrison, Kathie Rhodes, CRNA   80 mcg at 10/13/22 0933   propofol (DIPRIVAN) 10 mg/mL bolus/IV push   Intravenous Anesthesia Intra-op Fletcher-Harrison, Kathie Rhodes, CRNA   120 mg at 10/13/22 0910   rocuronium (ZEMURON) injection   Intravenous Anesthesia Intra-op Mohammed Kindle, CRNA   50 mg at 10/13/22 0915   succinylcholine (ANECTINE) syringe   Intravenous Anesthesia Intra-op Fletcher-Harrison, Kathie Rhodes, CRNA   80 mg at 10/13/22 0911     Objective: Vital signs in last 24 hours: Temp:  [97.9 F (36.6 C)-98.4 F (36.9 C)] 98.1 F (36.7 C) (10/04 0811) Pulse Rate:  [86-99] 86 (10/04 0811) Resp:  [18-20] 18 (10/04 0811) BP: (111-139)/(54-72) 139/72 (10/04 0811) SpO2:  [90 %-100 %] 100 % (10/04 0811) Weight:  [53 kg] 53 kg (10/04 0811)  Intake/Output from previous day: 10/03 0701 - 10/04 0700 In: -  Out: 975 [Urine:975] Intake/Output this shift: Total I/O In: 1093.1 [I.V.:793.1; IV Piggyback:300] Out: -    Physical Exam Vitals and nursing note  reviewed.  Constitutional:      Appearance: She is normal weight.  HENT:     Head: Normocephalic and atraumatic.  Eyes:     Extraocular Movements: Extraocular movements intact.     Pupils: Pupils are equal, round, and reactive to light.  Pulmonary:     Effort: Pulmonary effort is normal.  Abdominal:     General: Abdomen is flat.     Palpations: Abdomen is soft.     Comments: Nephrostomy tube in place.  Draining clear yellow urine.  Nephrostomy tube bandage clean and dry.  Genitourinary:    Comments: Nephrostomy tube in place, bandages are clean and dry.  Good urine output with clear yellow urine. Skin:    General: Skin is warm.  Neurological:     General: No focal deficit present.     Mental Status: She is alert.  Psychiatric:        Mood and Affect: Mood normal.     Lab Results:  Recent Labs    10/11/22 0511 10/12/22 0320  WBC 12.2* 14.9*  HGB 10.0* 10.3*  HCT 30.7* 32.3*  PLT 377 388   BMET Recent Labs    10/11/22 0511 10/12/22 0320  NA 138 137  K 3.1* 3.7  CL 106 106  CO2 21* 21*  GLUCOSE 87 102*  BUN 24* 19  CREATININE 1.08* 0.99  CALCIUM 7.9* 7.9*   PT/INR Recent Labs    10/11/22 0511  LABPROT 16.0*  INR 1.3*   ABG No results for input(s): "PHART", "HCO3" in the last 72 hours.  Invalid input(s): "PCO2", "PO2"  Studies/Results: IR NEPHROSTOMY PLACEMENT LEFT  Result Date: 10/11/2022 INDICATION: Hydronephrosis in the setting of inflammatory bowel process and distal ureteral obstruction EXAM: Placement of percutaneous nephrostomy tube using ultrasound and fluoroscopic guidance COMPARISON:  None Available. MEDICATIONS: Documented in the EMR ANESTHESIA/SEDATION: Moderate (conscious) sedation was employed during this procedure. A total of Versed 0.5 mg and Fentanyl 25 mcg was administered intravenously. Moderate Sedation Time: 30 minutes. The patient's level of consciousness and vital signs were monitored continuously by radiology nursing throughout the  procedure under my direct supervision. CONTRAST:  30 mL-administered into the collecting system(s) FLUOROSCOPY TIME:  Fluoroscopy Time: 5.8 minutes (21 mGy) COMPLICATIONS: None immediate. PROCEDURE: Informed written consent was obtained from the patient after a thorough discussion of the procedural risks, benefits and alternatives. All questions were addressed. Maximal Sterile Barrier Technique was utilized including caps, mask, sterile gowns, sterile gloves, sterile drape, hand hygiene and skin antiseptic. A timeout was performed prior to the initiation of the procedure. The patient was placed prone on the exam table. The left flank was prepped and draped in the standard sterile fashion. Ultrasound was used to evaluate the left kidney, which demonstrated hydronephrosis. Skin entry site was marked, and local analgesia was obtained with 1% lidocaine. Using ultrasound guidance, an upper pole calyx was initially selected due to positioning of the ribs and high position of the splenic flexure of the colon based on recent CT and visualization on ultrasound. Puncture was obtained using a 21-gauge Chiba needle. Entry into the collecting system was confirmed with return of urine, and gentle injection of contrast material under fluoroscopy opacifying the proximal collecting system. An 018 wire was advanced through the needle into the renal pelvis and proximal ureter, followed by placement of a transition dilator. An antegrade nephrostogram was then performed, which at this point demonstrated extra ureteral contrast material. The transitional dilator was withdrawn over wire in attempt to opacify the proximal collecting system, which was unsuccessful. The initial access was removed, and the decision was made to proceed with a second attempt at access. Again using ultrasound guidance, this time a lower pole calyx was identified as safe for percutaneous access. While maintaining visualization of the splenic flexure as best  possible on ultrasound, access was obtained into the proximal collecting system using a 21 gauge Chiba needle. Entry into the collecting system was again confirmed with return of urine and gentle injection of contrast material  opacifying the proximal collecting system. An 018 wire was advanced through the needle into the renal pelvis and proximal ureter, followed by placement of a transition dilator. An antegrade nephrostogram was then performed, this time demonstrating appropriate location in the proximal collecting system. Over an 035 Amplatz wire, the percutaneous tract was serially dilated, and a 10.2 French nephrostomy tube was advanced with the loop formed in the renal pelvis. Appropriate positioning of the nephrostomy tube was confirmed with injection of contrast material. The nephrostomy tube was secured to skin using silk suture and a dressing. It was attached to bag drainage. The patient tolerated the procedure well without immediate complication. IMPRESSION: Successful placement of a left-sided 10 French percutaneous nephrostomy tube. Nephrostomy tube placed to bag drainage. Electronically Signed   By: Olive Bass M.D.   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 we will do inflammation contributing to the formation of left hydronephrosis who underwent successful left nephrostomy tube placement with IR on 10/11/2022  -Nephrostomy tube in place with good urine output of clear yellow urine  Plan: -Patient scheduled for exploratory laparotomy and colostomy creation today  -Continue culture appropriate antibiotics for UTI -Reviewed nephrostomy tube care to patient and advised her that it will need to be in place for at least 6 to 8 weeks and at that time we will need to decide whether it needs to be exchanged, we can attempt internalization of the stent or discontinue it altogether -She is in  agreement with that plan -will schedule outpatient follow up    LOS: 2 days    [facility] 10/13/2022

## 2022-10-13 NOTE — Progress Notes (Signed)

## 2022-10-13 NOTE — Plan of Care (Signed)
  Problem: Respiratory: Goal: Ability to maintain adequate ventilation will improve Outcome: Progressing   Problem: Education: Goal: Knowledge of General Education information will improve Description: Including pain rating scale, medication(s)/side effects and non-pharmacologic comfort measures Outcome: Progressing   Problem: Activity: Goal: Risk for activity intolerance will decrease Outcome: Progressing   Problem: Elimination: Goal: Will not experience complications related to bowel motility Outcome: Progressing Goal: Will not experience complications related to urinary retention Outcome: Progressing

## 2022-10-13 NOTE — Care Management Important Message (Signed)
Important Message  Patient Details  Name: [name] MRN: [id number] Date of Birth: Jun 11, 1933   Important Message Given:  N/A - LOS <3 / Initial given by admissions     [name] 10/13/2022, 9:55 AM

## 2022-10-13 NOTE — Op Note (Signed)
PROCEDURES: 1. Sigmoid colectomy with end colostomy 2. Repair of small bowel fistula from diverticulitis 3. Umbilical hernia repair incarcerated     Pre-operative Diagnosis:colovaginal fistula, severe diverticulitis   Post-operative Diagnosis: same   Surgeon: Merri Ray Glorimar Stroope    Assistants: Baker Pierini MD   Anesthesia: General endotracheal anesthesia   ASA Class: 3   Surgeon: Sterling Big , MD FACS   Anesthesia: Gen. 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 The benefits, complications, treatment options, and expected outcomes were discussed with the patient. The risks of bleeding, infection, recurrence of symptoms, failure to resolve symptoms,  bowel injury, any of which could require further surgery were reviewed with the patient.   The patient was taken to Operating Room, identified and the procedure verified.  A Time Out was held and the above information confirmed.   Prior to the induction of general anesthesia, antibiotic prophylaxis was administered. VTE prophylaxis was in place. General endotracheal anesthesia was then administered and tolerated well. After the induction, the abdomen was prepped with Chloraprep and draped in the sterile fashion. The patient was positioned in Supine position. Midline abdominal incision was created and the fascia  was elevated and incised, the abdominal cavity was entered w/o injuries. We visualized a 1 cm Umbilical hernia.  There were dense adhesions from the omentum to the abdominal wall that where lysed in the standard fashion with the cautery and the ligasure .  Marland Kitchen There was significant adhesive disease in the pelvis from the sigmoid to the pelvic wall and also from the sigmoid to the Bladder, These adhesions were lysed with a combination of finger fracturing and Ligasure.  I decided to transect the distal descending colon first due to the severely disease pelvis and to use it as a handle. Descending colon divided with standard 75 GIA blue load.  The sigmoid and the Pelvis was very hostile, specifically midrectum was friable and severely disease. We identified the left ureter.  Attention was turned to the pelvis, chronic diverticulitis with Phlegmonous changes in the sigmoid were found. There was a fistula between the Bladder and sigmoid. THe connection was divided with harmonic and finger fracture. No actual bladder defects or urine was seen.  We continued our pelvic dissection;This took significant time and effort but we were able to finally get a reasonable distal rectal stump and the contour stapler was used to divide the distal rectum. Specimen was sent for pathology. There was also a loop of small bowel fused to the sigmoid colon as a small bowel to colon fistula. THis was taken down carefully avoiding any injuries to the bowel. THe small bowel was able to be dissected free but as a result of a fistula there was a partial thickness  small bowel injury, this was about 6 cm proximal to the IC valve. I repair this defect using multiple 2-0 silk sutures. No evidence of bowel narrowing or leaks observed.  The white line of TOLDT was identified and divided and we mobilized the descending colon IN a lateral to medial fashion. We preserved the ureter at all times.    Colostomy was brought up via left rectus at  umbilical level after created an incision on left abdominal wall. We irrigated w Iricept.  There was also adequate hemostasis. A 19 Blake drain was placed in the pelvis. We were able to mobilize the omentum and IThe drain was sutured in place with a 3-0 nylon. A second look showed no evidence of any bleeding or any other injuries. We changed gloves and  close the   abdomen with a -0 PDS suture in a running fashion using small bite technique, the prior ventral defect was incorporated within the closure. Liposomal Marcaine  was injected on all incision sites under direct visualization. Given Phlegmonous changes and active infection I placed a penrose and loosely closed the incision with staples. Sterile dressing placed.  Colostomy matured in the standard brook fashion w multiple 3-0 vicryls.  Please note that a second surgeon was required due to the complexity of the case, Dr Maurine Minister  assisted in retraction, dissection, closure and was instrumental to cross check decision making process during this complex operation.   Needle and laparotomy count were correct and there were no immediate complications   Sterling Big, MD, FACS

## 2022-10-13 NOTE — Transfer of Care (Signed)
Immediate Anesthesia Transfer of Care Note  Patient: [name]  Procedure(s) Performed: COLECTOMY WITH COLOSTOMY CREATION/HARTMANN PROCEDURE With REPAIR OF BOWEL (Abdomen)  Patient Location: PACU  Anesthesia Type:General  Level of Consciousness: awake, drowsy, and patient cooperative  Airway & Oxygen Therapy: Patient Spontanous Breathing and Patient connected to face mask oxygen  Post-op Assessment: Report given to RN and Post -op Vital signs reviewed and stable  Post vital signs: Reviewed and stable  Last Vitals:  Vitals Value Taken Time  BP    Temp    Pulse 102 10/13/22 1126  Resp 12 10/13/22 1126  SpO2 100 % 10/13/22 1126  Vitals shown include unfiled device data.  Last Pain:  Vitals:   10/13/22 0811  TempSrc: Temporal  PainSc: 0-No pain         Complications: No notable events documented.

## 2022-10-14 DIAGNOSIS — N179 Acute kidney failure, unspecified: Secondary | ICD-10-CM | POA: Diagnosis not present

## 2022-10-14 DIAGNOSIS — R6520 Severe sepsis without septic shock: Secondary | ICD-10-CM | POA: Diagnosis not present

## 2022-10-14 DIAGNOSIS — A419 Sepsis, unspecified organism: Secondary | ICD-10-CM | POA: Diagnosis not present

## 2022-10-14 LAB — BASIC METABOLIC PANEL
Anion gap: 11 (ref 5–15)
BUN: 14 mg/dL (ref 8–23)
CO2: 20 mmol/L — ABNORMAL LOW (ref 22–32)
Calcium: 7.5 mg/dL — ABNORMAL LOW (ref 8.9–10.3)
Chloride: 106 mmol/L (ref 98–111)
Creatinine, Ser: 0.99 mg/dL (ref 0.44–1.00)
GFR, Estimated: 55 mL/min — ABNORMAL LOW (ref >60)
Glucose, Bld: 108 mg/dL — ABNORMAL HIGH (ref 70–99)
Potassium: 3.5 mmol/L (ref 3.5–5.1)
Sodium: 137 mmol/L (ref 135–145)

## 2022-10-14 LAB — CBC
HCT: 26.3 % — ABNORMAL LOW (ref 36.0–46.0)
Hemoglobin: 8.3 g/dL — ABNORMAL LOW (ref 12.0–15.0)
MCH: 28.2 pg (ref 26.0–34.0)
MCHC: 31.6 g/dL (ref 30.0–36.0)
MCV: 89.5 fL (ref 80.0–100.0)
Platelets: 333 10*3/uL (ref 150–400)
RBC: 2.94 MIL/uL — ABNORMAL LOW (ref 3.87–5.11)
RDW: 14.6 % (ref 11.5–15.5)
WBC: 15.4 10*3/uL — ABNORMAL HIGH (ref 4.0–10.5)
nRBC: 0 % (ref 0.0–0.2)

## 2022-10-14 LAB — MAGNESIUM: Magnesium: 1.6 mg/dL — ABNORMAL LOW (ref 1.7–2.4)

## 2022-10-14 MED ORDER — POTASSIUM CHLORIDE CRYS ER 20 MEQ PO TBCR
20.0000 meq | EXTENDED_RELEASE_TABLET | Freq: Once | ORAL | Status: AC
  Administered 2022-10-14: 20 meq via ORAL
  Filled 2022-10-14: qty 1

## 2022-10-14 MED ORDER — MAGNESIUM SULFATE 2 GM/50ML IV SOLN
2.0000 g | Freq: Once | INTRAVENOUS | Status: AC
  Administered 2022-10-14: 2 g via INTRAVENOUS
  Filled 2022-10-14: qty 50

## 2022-10-14 NOTE — Evaluation (Signed)
Occupational Therapy Evaluation Patient Details Name: [name] MRN: [id number] DOB: February 11, 1933 Today's Date: 10/14/2022   History of Present Illness Pt is an 89 y.o. female with medical history significant of hypothyroidism, thyroid cancer status post partial thyroidectomy, essential hypertension not on any home medications, C. difficile colitis, and GERD.  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 S/p nephrostomy drain and exploratory laparatomy with colostomy creation.   Clinical Impression   Pt received in recliner, MD leaving room. Agreeable to OT eval. Endorses 1/10 pain in abdomen. PTA, pt is IND  with all aspects of ADL and IADL performance (including driving) and lives alone in a multi-level home. 1 recent fall, and her brother checks in on her daily.    Pt functionally limited by generalized weakness, decreased insight into deficits, and decreased balance. Performs oral care sitting in recliner with setup. Requires setup for UB ADLs, MIN A for LB ADLs, and CGA for step pivot transfers using RW. 4x steps taken forwards using RW and CGA with OT managing lines/leads. STM deficits noted - although MD had just left room, pt unable to recall details of conversation regarding recent procedures. Recommending an increased level of support upon hospital discharge as pt lives alone and needs to go upstairs to reach bedroom/bathroom. 0/10 pain end of session, pt returned to bed, needs within reach and alarm activated. RN updated on pt's status. Pt would benefit from skilled OT services to address noted impairments and functional limitations (see below for any additional details) in order to maximize safety and independence while minimizing falls risk and  caregiver burden. Anticipate the need for follow up OT services upon acute hospital DC.       If plan is discharge home, recommend the following: A little help with walking and/or transfers;A lot of help with bathing/dressing/bathroom;Assistance with cooking/housework;Assistance with feeding;Direct supervision/assist for medications management;Direct supervision/assist for financial management;Assist for transportation;Supervision due to cognitive status    Functional Status Assessment  Patient has had a recent decline in their functional status and demonstrates the ability to make significant improvements in function in a reasonable and predictable amount of time.  Equipment Recommendations  None recommended by OT    Recommendations for Other Services Other (comment)     Precautions / Restrictions Precautions Precautions: Fall Precaution Comments: jp drain, colostomy, nephrostomy Restrictions Weight Bearing Restrictions: No      Mobility Bed Mobility Overal bed mobility: Needs Assistance Bed Mobility: Rolling, Sidelying to Sit, Sit to Supine Rolling: Contact guard assist     Sit to supine: Min assist (log roll)   General bed mobility comments: log roll technique    Transfers Overall transfer level: Needs assistance Equipment used: Rolling walker (2 wheels) Transfers: Sit to/from Stand, Bed to chair/wheelchair/BSC Sit to Stand: Contact guard assist     Step pivot transfers: Contact guard assist     General transfer comment: cued for RW positioning and hand placement      Balance Overall balance assessment: Needs assistance Sitting-balance support: Feet supported Sitting balance-Leahy Scale: Fair     Standing balance support: During functional activity Standing balance-Leahy Scale: Fair                             ADL either performed  or assessed with clinical judgement   ADL Overall ADL's : Needs assistance/impaired Eating/Feeding: Set  up;Sitting   Grooming: Set up;Oral care;Wash/dry hands;Wash/dry face;Sitting                   Toilet Transfer: Contact guard assist;Rolling walker (2 wheels) (simulated, assist for lines/leads) Toilet Transfer Details (indicate cue type and reason): simulated recliner > bed Toileting- Clothing Manipulation and Hygiene: Total assistance Toileting - Clothing Manipulation Details (indicate cue type and reason): Foley + colostomy bag     Functional mobility during ADLs: Contact guard assist;Rollator (4 wheels) (mgmt for lines/leads/drains) General ADL Comments: Pt functionally limited by generalized weakness, decreased insight into deficits, and decreased balance. Requires setup for UB ADLs, MIN A for LB ADLs, and CGA for step pivot transfers using RW.      Pertinent Vitals/Pain Pain Assessment Pain Assessment: 0-10 Pain Score: 1  Pain Location: generalized stomach area Pain Descriptors / Indicators: Discomfort, Grimacing, Guarding Pain Intervention(s): Limited activity within patient's tolerance     Extremity/Trunk Assessment Upper Extremity Assessment Upper Extremity Assessment: Defer to OT evaluation   Lower Extremity Assessment Lower Extremity Assessment: Generalized weakness (able to move BLE against gravity, recent L TKA)       Communication Communication Communication: Hearing impairment;No apparent difficulties   Cognition Arousal: Alert Behavior During Therapy: WFL for tasks assessed/performed Overall Cognitive Status: No family/caregiver present to determine baseline cognitive functioning (STM deficits noted, mild decreased awareness of situation/deficits. MD leaving room when OT enters; pt unable to recall details of conversation)                                 General Comments: pt A&Ox4     General Comments  Lines/leads intact start and end of session            Home Living Family/patient expects to be discharged to:: Private  residence Living Arrangements: Alone Available Help at Discharge: Family;Available PRN/intermittently Type of Home: House Home Access: Stairs to enter [employer] of Steps: 4-5 STE with R rail to central level and then 4-5 steps down to basement with railing on R ascending   Home Layout: Multi-level (3 level home, kitchen on 2nd level, bedroom/bath on 3rd level. Typically enters on 2nd level. 2nd level only has kitchen/dining room, needs to go up stairs for bathroom)     Bathroom Shower/Tub: Walk-in shower;Tub/shower unit   Bathroom Toilet: Handicapped height     Home Equipment: Cane - single point;Toilet riser;Other (comment);Lift chair;Grab bars - tub/shower;Grab bars - toilet (GB being installed)   Additional Comments: brother checks in on her daily, daughter nearby      Prior Functioning/Environment Prior Level of Function : Independent/Modified Independent;Driving             Mobility Comments: 1 fall in September (dizzy, fell in bathtub). Does not typically use AD at baseline. ADLs Comments: IND, driving, manages meds        OT Problem List: Decreased strength;Decreased activity tolerance;Impaired balance (sitting and/or standing);Decreased safety awareness;Decreased knowledge of use of DME or AE;Decreased knowledge of precautions;Decreased cognition      OT Treatment/Interventions: Self-care/ADL training;Therapeutic exercise;Energy conservation;DME and/or AE instruction;Therapeutic activities;Patient/family education;Balance training    OT Goals(Current goals can be found in the care plan section) Acute Rehab OT Goals Patient Stated Goal: to go home OT Goal Formulation: With patient Time For Goal Achievement: 10/28/22 Potential to Achieve Goals: Good  OT  Frequency: Min 1X/week       AM-PAC OT "6 Clicks" Daily Activity     Outcome Measure Help from another person eating meals?: None Help from another person taking care of personal grooming?: A  Little Help from another person toileting, which includes using toliet, bedpan, or urinal?: Total Help from another person bathing (including washing, rinsing, drying)?: A Lot Help from another person to put on and taking off regular upper body clothing?: A Little Help from another person to put on and taking off regular lower body clothing?: A Little 6 Click Score: 16   End of Session Equipment Utilized During Treatment: Rolling walker (2 wheels) Nurse Communication: Mobility status  Activity Tolerance: Patient tolerated treatment well Patient left: in bed;with call bell/phone within reach;with bed alarm set;with SCD's reapplied  OT Visit Diagnosis: Other abnormalities of gait and mobility (R26.89);Muscle weakness (generalized) (M62.81);Unsteadiness on feet (R26.81)                Time: 2841-3244 OT Time Calculation (min): 37 min Charges:  OT General Charges $OT Visit: 1 Visit OT Evaluation $OT Eval Moderate Complexity: 1 Mod OT Treatments $Self Care/Home Management : 8-22 mins Niki Cosman L. Blakelynn Scheeler, OTR/L  10/14/22, 1:10 PM  10/14/2022, 12:58 PM

## 2022-10-14 NOTE — Progress Notes (Signed)
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  No acute events overnight.  Patient denies any worsening pain but she is wondering what exactly was done during surgery yesterday and were all the things connected to her today.  Vital signs: Temp:  [97.5 F (36.4 C)-98.1 F (36.7 C)] 97.7 F (36.5 C) (10/05 0800) Pulse Rate:  [76-101] 81 (10/05 0800) Resp:  [12-20] 14 (10/05 0532) BP: (94-140)/(57-80) 103/63 (10/05 0800) SpO2:  [92 %-100 %] 97 % (10/05 0800)   Intake/Output: 10/04 0701 - 10/05 0700 In: 2443.1 [I.V.:1793.1; IV Piggyback:650] Out: 855 [Urine:490; Drains:290; Blood:75] Last BM Date : 10/13/22  Physical Exam: Constitutional: No acute distress Abdomen: Soft, nondistended, with appropriate soreness to palpation in the midline.  Incision is intact with staples in place and Penrose drain in place.  Patient has a left-sided JP drain which has serosanguineous fluid.  Patient's left-sided ostomy with some mucosal edema but no evidence of ischemia.  No stool or gas in the bag, only bowel sweat.  Labs:  Recent Labs    10/12/22 0320 10/14/22 0420  WBC 14.9* 15.4*  HGB 10.3* 8.3*  HCT 32.3* 26.3*  PLT 388 333   Recent Labs    10/12/22 0320 10/14/22 0420  NA 137 137  K 3.7 3.5  CL 106 106  CO2 21* 20*  GLUCOSE 102* 108*  BUN 19 14  CREATININE 0.99 0.99  CALCIUM 7.9* 7.5*   No results for input(s): "LABPROT", "INR" in the last 72 hours.  Imaging: No results found.  Assessment/Plan: This is a 89 y.o. 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  - Discussed with patient what was done yesterday during surgery with removal of the sigmoid colon which has an area of diverticulitis and the fistula to the bladder and a portion of the small intestine.  Discussed with her the repair and the small bowel and that there was no true  defect noted in the bladder.  Discussed with her that given the inflammation and infection from her diverticulitis, and end colostomy was done and so now stool will come through the stoma which is at the level of her skin.   - The patient also has a Foley catheter in her bladder which will stay in place given the colovesical fistula.  Patient also has a left nephrostomy tube which is currently working well. - For now, continue only full liquid diet without advancing further given that there is really no bowel function yet.  Will back down to clear liquids if any worsening distention or nausea. - Continue working with physical therapy.  WOC consult has been placed to start on 10/16/2022. - Will continue to follow with you   Howie Ill, MD [facility]

## 2022-10-14 NOTE — TOC Progression Note (Signed)
Transition of Care [facility]) - Progression Note    Patient Details  Name: [name] MRN: [id number] Date of Birth: 01/01/1934  [facility] CM/SW Contact  Liliana Cline, LCSW Phone Number: 10/14/2022, 12:28 PM  Clinical Narrative:    Met with patient at bedside to discuss therapy rec for SNF.  Patient states she does not feel she needs to go to SNF. Patient states she feels comfortable returning home with continued [facility] care through [facility]. Patient agreed to let staff know if she changes her mind. Patient states her family checks on her daily and her brother will transport her home when she is DC.        Expected Discharge Plan and Services                                               Social Determinants of Health (SDOH) Interventions SDOH Screenings   Food Insecurity: No Food Insecurity (10/10/2022)  Housing: Low Risk  (10/10/2022)  Transportation Needs: No Transportation Needs (10/10/2022)  Utilities: Not At Risk (10/10/2022)  Financial Resource Strain: Low Risk  (10/09/2022)   Received from [facility]  Tobacco Use: Medium Risk (10/13/2022)    Readmission Risk Interventions     No data to display

## 2022-10-14 NOTE — Evaluation (Signed)
Physical Therapy Evaluation Patient Details Name: [name] MRN: [id number] DOB: [birth year] Today's Date: 10/14/2022  History of Present Illness  Pt is an 89 y.o. female with medical history significant of hypothyroidism, thyroid cancer status post partial thyroidectomy, essential hypertension not on any home medications, C. difficile colitis, and GERD.  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 S/p nephrostomy drain and exploratory laparatomy with colostomy creation.   Clinical Impression  Pt A&Ox4, exhibited mild signs/symptoms of pain with mobility in regards to her abdomen. Per pt at baseline she is independent and lives alone.   Pt provided assistance with all lines/leads with mobility, pt educated as able. She was able to perform the log roll technique with verbal and tactile cues, minA. Seated EOB with fair balance for several minutes. Sit <> stand with RW and CGA, and able to take several steps to recliner with RW and CGA. Further mobility deferred due to breakfast arrival, pt pain (requested pain medication and RN notified) as well as pts desire to speak with MD.  Overall the patient demonstrated deficits (see "PT Problem List") that impede the patient's functional abilities, safety, and mobility and would benefit from skilled PT intervention.           If plan is discharge home, recommend the following: A little help with walking and/or transfers;A little help with bathing/dressing/bathroom;Assistance with cooking/housework;Assist for transportation;Direct supervision/assist for medications management;Help with stairs or ramp for entrance   Can travel by private vehicle   No    Equipment Recommendations Other (comment) (TBD at next  venue of care)  Recommendations for Other Services       Functional Status Assessment Patient has had a recent decline in their functional status and demonstrates the ability to make significant improvements in function in a reasonable and predictable amount of time.     Precautions / Restrictions Precautions Precautions: Fall Precaution Comments: jp drain, colostomy, nephrostomy Restrictions Weight Bearing Restrictions: No      Mobility  Bed Mobility Overal bed mobility: Needs Assistance Bed Mobility: Rolling, Sidelying to Sit Rolling: Contact guard assist Sidelying to sit: Min assist       General bed mobility comments: log roll technique    Transfers Overall transfer level: Needs assistance Equipment used: Rolling walker (2 wheels) Transfers: Sit to/from Stand, Bed to chair/wheelchair/BSC Sit to Stand: Contact guard assist   Step pivot transfers: Contact guard assist       General transfer comment: cued for RW positioning and hand placement    Ambulation/Gait                  Stairs            Wheelchair Mobility     Tilt Bed    Modified Rankin (Stroke Patients Only)       Balance Overall balance assessment: Needs assistance Sitting-balance support: Feet supported Sitting balance-Leahy Scale: Fair     Standing balance support: During functional activity Standing balance-Leahy Scale: Fair                               Pertinent Vitals/Pain Pain Assessment Pain Assessment: Faces Faces Pain Scale: Hurts little more Pain Location: abdomen Pain Descriptors / Indicators: Guarding, Grimacing Pain Intervention(s):  Limited activity within patient's tolerance, Monitored during session, Repositioned, Patient requesting pain meds-RN notified    Home Living Family/patient expects to be discharged to:: Private residence Living Arrangements: Alone Available Help at Discharge: Family;Available PRN/intermittently Type of Home:  House Home Access: Stairs to enter   [employer] of Steps: 4-5 STE with R rail to central level and then 4-5 steps down to basement (main living area) with railing on R ascending   Home Layout: Multi-level (3 level home, kitchen on 2nd level, bedroom/bath on 3rd level. Typically enters on 2nd level.) Home Equipment: Cane - single point;Toilet riser;Other (comment);Lift chair Additional Comments: brother checks in on her daily, daughter nearby    Prior Function Prior Level of Function : Independent/Modified Independent;Driving             Mobility Comments: 1 fall in September (dizzy, fell in bathtub). Does not typically use AD at baseline. ADLs Comments: IND, driving, manages meds     Extremity/Trunk Assessment   Upper Extremity Assessment Upper Extremity Assessment: Defer to OT evaluation    Lower Extremity Assessment Lower Extremity Assessment: Generalized weakness (able to move BLE against gravity, recent L TKA)       Communication   Communication Communication: Hearing impairment;No apparent difficulties  Cognition Arousal: Alert Behavior During Therapy: WFL for tasks assessed/performed Overall Cognitive Status: Within Functional Limits for tasks assessed                                          General Comments      Exercises     Assessment/Plan    PT Assessment Patient needs continued PT services  PT Problem List Decreased activity tolerance;Decreased mobility;Decreased balance;Decreased knowledge of use of DME;Decreased strength;Decreased range of motion;Pain       PT Treatment Interventions DME instruction;Neuromuscular re-education;Gait training;Stair training;Patient/family education;Functional mobility training;Therapeutic activities;Therapeutic exercise;Balance training    PT Goals (Current goals can be found in the Care Plan section)  Acute Rehab PT Goals Patient Stated Goal: to have no abdomen pain PT Goal  Formulation: With patient Time For Goal Achievement: 10/28/22 Potential to Achieve Goals: Good    Frequency Min 1X/week     Co-evaluation               AM-PAC PT "6 Clicks" Mobility  Outcome Measure Help needed turning from your back to your side while in a flat bed without using bedrails?: A Little Help needed moving from lying on your back to sitting on the side of a flat bed without using bedrails?: A Little Help needed moving to and from a bed to a chair (including a wheelchair)?: A Little Help needed standing up from a chair using your arms (e.g., wheelchair or bedside chair)?: A Little Help needed to walk in hospital room?: A Little Help needed climbing 3-5 steps with a railing? : A Lot 6 Click Score: 17    End of Session   Activity Tolerance: Patient tolerated treatment well Patient left: in chair;with call bell/phone within reach;with chair alarm set Nurse Communication: Mobility status;Patient requests pain meds PT Visit Diagnosis: Other abnormalities of gait and mobility (R26.89);Muscle weakness (generalized) (M62.81);Difficulty in walking, not elsewhere classified (R26.2);Pain Pain - Right/Left:  (midline) Pain - part of body:  (abdomen)    Time: 2841-3244 PT Time Calculation (min) (ACUTE ONLY): 26 min   Charges:   PT Evaluation $PT Eval Low Complexity: 1  Low PT Treatments $Therapeutic Activity: 23-37 mins PT General Charges $$ ACUTE PT VISIT: 1 Visit         Olga Coaster PT, DPT 11:17 AM,10/14/22

## 2022-10-14 NOTE — Progress Notes (Signed)
PHARMACY CONSULT NOTE  Pharmacy Consult for Electrolyte Monitoring and Replacement   Recent Labs: Potassium (mmol/L)  Date Value  10/14/2022 3.5   Magnesium (mg/dL)  Date Value  02/72/5366 1.6 (L)   Calcium (mg/dL)  Date Value  44/03/4740 7.5 (L)   Albumin (g/dL)  Date Value  59/56/3875 2.7 (L)   Sodium (mmol/L)  Date Value  10/14/2022 137   Assessment: 89 y.o. female with medical history significant of hypothyroidism, thyroid cancer status post partial thyroidectomy, essential hypertension not on any home medications, C. difficile colitis, and GERD. Pharmacy is asked to follow and replace electrolytes  Goal of Therapy:  Electrolytes WNL  Plan:  Mag 1.6  MD ordered Magnesium sulfate 2 gm IV x1 K 3.5  Will order KCL 20 meq PO x 1 F/u electrolytes with am labs  Manraj Yeo A 10/14/2022 7:38 AM

## 2022-10-14 NOTE — Plan of Care (Signed)
  Problem: Activity: Goal: Ability to avoid complications of mobility impairment will improve Outcome: Progressing   Problem: Education: Goal: Knowledge of the prescribed therapeutic regimen will improve Outcome: Progressing Goal: Individualized Educational Video(s) Outcome: Progressing   Problem: Activity: Goal: Ability to avoid complications of mobility impairment will improve Outcome: Progressing Goal: Range of joint motion will improve Outcome: Progressing   Problem: Clinical Measurements: Goal: Postoperative complications will be avoided or minimized Outcome: Progressing   Problem: Pain Management: Goal: Pain level will decrease with appropriate interventions Outcome: Progressing   Problem: Skin Integrity: Goal: Will show signs of wound healing Outcome: Progressing   Problem: Fluid Volume: Goal: Hemodynamic stability will improve Outcome: Progressing   Problem: Clinical Measurements: Goal: Diagnostic test results will improve Outcome: Progressing Goal: Signs and symptoms of infection will decrease Outcome: Progressing   Problem: Respiratory: Goal: Ability to maintain adequate ventilation will improve Outcome: Progressing   Problem: Education: Goal: Knowledge of General Education information will improve Description: Including pain rating scale, medication(s)/side effects and non-pharmacologic comfort measures Outcome: Progressing   Problem: Health Behavior/Discharge Planning: Goal: Ability to manage health-related needs will improve Outcome: Progressing   Problem: Clinical Measurements: Goal: Ability to maintain clinical measurements within normal limits will improve Outcome: Progressing Goal: Will remain free from infection Outcome: Progressing Goal: Diagnostic test results will improve Outcome: Progressing Goal: Respiratory complications will improve Outcome: Progressing Goal: Cardiovascular complication will be avoided Outcome: Progressing    Problem: Activity: Goal: Risk for activity intolerance will decrease Outcome: Progressing   Problem: Nutrition: Goal: Adequate nutrition will be maintained Outcome: Progressing   Problem: Coping: Goal: Level of anxiety will decrease Outcome: Progressing   Problem: Elimination: Goal: Will not experience complications related to bowel motility Outcome: Progressing Goal: Will not experience complications related to urinary retention Outcome: Progressing   Problem: Pain Managment: Goal: General experience of comfort will improve Outcome: Progressing   Problem: Safety: Goal: Ability to remain free from injury will improve Outcome: Progressing   Problem: Skin Integrity: Goal: Risk for impaired skin integrity will decrease Outcome: Progressing

## 2022-10-14 NOTE — Progress Notes (Signed)
[facility]  - [place] at [facility]   PATIENT NAME: [name]    MR#:  [id number]  DATE OF BIRTH:  December 13, 1933  SUBJECTIVE:  No pain complaint.  Tolerating full liquid diet.  VITALS:  Blood pressure 119/72, pulse 91, temperature 98 F (36.7 C), resp. rate 18, height 5\' 2"  (1.575 m), weight 53 kg, SpO2 100%.  PHYSICAL EXAMINATION:   Constitutional: NAD, AAOx3 HEENT: conjunctivae and lids normal, EOMI CV: No cyanosis.   RESP: normal respiratory effort, on RA Neuro: II - XII grossly intact.   Psych: Normal mood and affect.  Appropriate judgement and reason Foley, left nephrostomy tube   LABORATORY PANEL:  CBC Recent Labs  Lab 10/14/22 0420  WBC 15.4*  HGB 8.3*  HCT 26.3*  PLT 333    Chemistries  Recent Labs  Lab 10/10/22 0934 10/11/22 0511 10/14/22 0420  NA 133*   < > 137  K 3.7   < > 3.5  CL 97*   < > 106  CO2 23   < > 20*  GLUCOSE 119*   < > 108*  BUN 28*   < > 14  CREATININE 1.48*   < > 0.99  CALCIUM 8.6*   < > 7.5*  MG  --    < > 1.6*  AST 13*  --   --   ALT 14  --   --   ALKPHOS 86  --   --   BILITOT 0.7  --   --    < > = values in this interval not displayed.   Cardiac Enzymes No results for input(s): "TROPONINI" in the last 168 hours. RADIOLOGY:  No results found.  Assessment and Plan  [name] is a 89 y.o. female with medical history significant of hypothyroidism, thyroid cancer status post partial thyroidectomy, essential hypertension not on any home medications, C. difficile colitis, and GERD.  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 -- urine culture-- >100K  gram-negative rods -- blood culture negative - Gastroenterology consulted for multiple colonic fistulas concerning for inflammatory bowel process, appreciate their consultation and recommendations-- G.I. signed off. 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  --cont Zosyn --cont Full liquid diet, advance per GenSurg --cont Foley given colovesical fistula    Acute Kidney Injury due to external Obstructive Uropathy --Likely post renal; moderate to severe (L) hydroureteronephrosis related to compression by inflammatory changes in bowel seen on CT Abdomen/Pelvis --d/c MIVF, encourage oral hydration   Normocytic Anemia --stable   Thrombocytosis Appears chronic in nature, PLT was 463 on 06/19/22 in the [facility] system   Hypothyroidism  Hx of Thyroid Cancer s/p Partial Thyroidectomy -Continue home Levothyroxine   Essential Hypertension - Not on any outpatient medication   GERD - Protonix  H/o RA --on methotrexate and  folic acid --follows with Dr Allena Katz at [facility]   Hypokalemia Hypomag --monitor and supplement PRN   Family communication :  Consults : G.I., general surgery, urology CODE STATUS: full DVT Prophylaxis :Lovenox Level of care: Telemetry Medical Status is: Inpatient Remains inpatient appropriate because: on IV abx, waiting on bowel function to return    TOTAL TIME TAKING CARE OF THIS PATIENT: 35 minutes.  >50% time spent on  counselling and coordination of care   Darlin Priestly M.D    [facility]   CC: Primary care physician; Danella Penton, MD

## 2022-10-14 NOTE — Anesthesia Postprocedure Evaluation (Signed)
Anesthesia Post Note  Patient: [name]  Procedure(s) Performed: COLECTOMY WITH COLOSTOMY CREATION/HARTMANN PROCEDURE With REPAIR OF BOWEL (Abdomen)  Patient location during evaluation: PACU Anesthesia Type: General Level of consciousness: awake and alert Pain management: pain level controlled Vital Signs Assessment: post-procedure vital signs reviewed and stable Respiratory status: spontaneous breathing, nonlabored ventilation, respiratory function stable and patient connected to nasal cannula oxygen Cardiovascular status: blood pressure returned to baseline and stable Postop Assessment: no apparent nausea or vomiting Anesthetic complications: no   No notable events documented.   Last Vitals:  Vitals:   10/14/22 0532 10/14/22 0800  BP: 103/63 103/63  Pulse: 80 81  Resp: 14   Temp:  36.5 C  SpO2: 100% 97%    Last Pain:  Vitals:   10/14/22 0558  TempSrc:   PainSc: 2                  Corinda Gubler

## 2022-10-15 ENCOUNTER — Encounter: Payer: Self-pay | Admitting: Surgery

## 2022-10-15 DIAGNOSIS — K632 Fistula of intestine: Secondary | ICD-10-CM | POA: Diagnosis not present

## 2022-10-15 DIAGNOSIS — N179 Acute kidney failure, unspecified: Secondary | ICD-10-CM | POA: Diagnosis not present

## 2022-10-15 DIAGNOSIS — D649 Anemia, unspecified: Secondary | ICD-10-CM | POA: Diagnosis not present

## 2022-10-15 DIAGNOSIS — A419 Sepsis, unspecified organism: Secondary | ICD-10-CM | POA: Diagnosis not present

## 2022-10-15 LAB — CBC
HCT: 25.3 % — ABNORMAL LOW (ref 36.0–46.0)
Hemoglobin: 8.2 g/dL — ABNORMAL LOW (ref 12.0–15.0)
MCH: 28.2 pg (ref 26.0–34.0)
MCHC: 32.4 g/dL (ref 30.0–36.0)
MCV: 86.9 fL (ref 80.0–100.0)
Platelets: 357 10*3/uL (ref 150–400)
RBC: 2.91 MIL/uL — ABNORMAL LOW (ref 3.87–5.11)
RDW: 14.7 % (ref 11.5–15.5)
WBC: 12 10*3/uL — ABNORMAL HIGH (ref 4.0–10.5)
nRBC: 0 % (ref 0.0–0.2)

## 2022-10-15 LAB — CULTURE, BLOOD (ROUTINE X 2)
Culture: NO GROWTH
Culture: NO GROWTH
Special Requests: ADEQUATE

## 2022-10-15 LAB — BASIC METABOLIC PANEL
Anion gap: 9 (ref 5–15)
BUN: 13 mg/dL (ref 8–23)
CO2: 23 mmol/L (ref 22–32)
Calcium: 7.4 mg/dL — ABNORMAL LOW (ref 8.9–10.3)
Chloride: 106 mmol/L (ref 98–111)
Creatinine, Ser: 0.86 mg/dL (ref 0.44–1.00)
GFR, Estimated: >60 mL/min (ref >60)
Glucose, Bld: 98 mg/dL (ref 70–99)
Potassium: 3.7 mmol/L (ref 3.5–5.1)
Sodium: 138 mmol/L (ref 135–145)

## 2022-10-15 LAB — MAGNESIUM: Magnesium: 2 mg/dL (ref 1.7–2.4)

## 2022-10-15 LAB — PHOSPHORUS: Phosphorus: 2.2 mg/dL — ABNORMAL LOW (ref 2.5–4.6)

## 2022-10-15 MED ORDER — POTASSIUM & SODIUM PHOSPHATES 280-160-250 MG PO PACK
2.0000 | PACK | Freq: Once | ORAL | Status: AC
  Administered 2022-10-15: 2 via ORAL
  Filled 2022-10-15: qty 2

## 2022-10-15 MED ORDER — POTASSIUM PHOSPHATES 15 MMOLE/5ML IV SOLN
15.0000 mmol | Freq: Once | INTRAVENOUS | Status: AC
  Administered 2022-10-15: 15 mmol via INTRAVENOUS
  Filled 2022-10-15: qty 5

## 2022-10-15 MED ORDER — CHLORHEXIDINE GLUCONATE CLOTH 2 % EX PADS
6.0000 | MEDICATED_PAD | Freq: Every day | CUTANEOUS | Status: DC
  Administered 2022-10-15 – 2022-10-17 (×3): 6 via TOPICAL

## 2022-10-15 NOTE — Plan of Care (Signed)
  Problem: Skin Integrity: Goal: Risk for impaired skin integrity will decrease Outcome: Progressing   Problem: Safety: Goal: Ability to remain free from injury will improve Outcome: Progressing   Problem: Elimination: Goal: Will not experience complications related to bowel motility Outcome: Progressing

## 2022-10-15 NOTE — Progress Notes (Signed)
PHARMACY CONSULT NOTE  Pharmacy Consult for Electrolyte Monitoring and Replacement   Recent Labs: Potassium (mmol/L)  Date Value  10/15/2022 3.7   Magnesium (mg/dL)  Date Value  16/10/9602 2.0   Calcium (mg/dL)  Date Value  54/09/8117 7.4 (L)   Albumin (g/dL)  Date Value  14/78/2956 2.7 (L)   Phosphorus (mg/dL)  Date Value  21/30/8657 2.2 (L)   Sodium (mmol/L)  Date Value  10/15/2022 138   Corrected Ca:  8.4      Assessment: 89 y.o. female with medical history significant of hypothyroidism, thyroid cancer status post partial thyroidectomy, essential hypertension not on any home medications, C. difficile colitis, and GERD. Pharmacy is asked to follow and replace electrolytes  Diet: full liquid  Goal of Therapy:  Electrolytes WNL  Plan:  Phos 2.2,  K 3.7   Will order PhosNa-K  2 packets x 1 dose F/u electrolytes with am labs  Bari Mantis PharmD Clinical Pharmacist 10/15/2022

## 2022-10-15 NOTE — Consult Note (Signed)
WOC consult received for new end colostomy. WOC team had marked patient preop and will begin ostomy education Monday 10/16/2022.   Thank you,    Priscella Mann MSN, RN-BC, [employer] [phone]

## 2022-10-15 NOTE — Progress Notes (Signed)
[facility]  - [place] at [facility]   PATIENT NAME: [name]    MR#:  [id number]  DATE OF BIRTH:  11-29-1933  SUBJECTIVE:  no family at bedside when seen earlier. Patient overall improving slowly. Tolerating diet. Worked with physical therapy. Denies any other complaints. Colostomy education to be done on Monday    VITALS:  Blood pressure 127/75, pulse 87, temperature 98 F (36.7 C), temperature source Oral, resp. rate 17, height 5\' 2"  (1.575 m), weight 53 kg, SpO2 97%.  PHYSICAL EXAMINATION:   GENERAL:  89 y.o.-year-old patient with no acute distress.  LUNGS: Normal breath sounds bilaterally, no wheezing CARDIOVASCULAR: S1, S2 normal. No murmur   ABDOMEN: left nephrostomy drain, JP drain, Foley catheter, midline incision, colostomy EXTREMITIES: No  edema b/l.    NEUROLOGIC: nonfocal  patient is alert and awake   LABORATORY PANEL:  CBC Recent Labs  Lab 10/15/22 0343  WBC 12.0*  HGB 8.2*  HCT 25.3*  PLT 357    Chemistries  Recent Labs  Lab 10/10/22 0934 10/11/22 0511 10/15/22 0343  NA 133*   < > 138  K 3.7   < > 3.7  CL 97*   < > 106  CO2 23   < > 23  GLUCOSE 119*   < > 98  BUN 28*   < > 13  CREATININE 1.48*   < > 0.86  CALCIUM 8.6*   < > 7.4*  MG  --    < > 2.0  AST 13*  --   --   ALT 14  --   --   ALKPHOS 86  --   --   BILITOT 0.7  --   --    < > = values in this interval not displayed.   Assessment and Plan   [name] is a 89 y.o. female with medical history significant of hypothyroidism, thyroid cancer status post partial thyroidectomy, essential hypertension not on any home medications, C. difficile colitis, and GERD.  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   Sepsis with Intra-abdominal Source Multiple Colonic Fistulas colovesical fistula  Acute Cystitis/left severe hydronephrosis --HR 110, WBC 22.2;  --CT Abdomen Pelvis shows enterocolonic and colocolonic fistulas with associated inflammatory changes, No fluid collection/abscess, no free air. 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 -- urine culture-- >100K  gram-negative rods -- blood culture negative - Gastroenterology consulted for multiple colonic fistulas concerning for inflammatory bowel process, appreciate their consultation and recommendations-- G.I. signed off. 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  --cont Zosyn --cont Full liquid diet, advance per GenSurg --cont Foley given colovesical fistula  --seen by PT/OT--rehab however pt wants to go home   Acute Kidney Injury due to external Obstructive Uropathy --Likely post renal; moderate to severe (L) hydroureteronephrosis related to compression by inflammatory changes in bowel seen on CT Abdomen/Pelvis --d/c MIVF, encourage oral hydration -- will discuss with urology regarding duration for left nephrostomy drain   Normocytic Anemia --stable   Thrombocytosis Appears chronic in nature, PLT was 463 on 06/19/22  in the [facility] system   Hypothyroidism  Hx of Thyroid Cancer s/p Partial Thyroidectomy -Continue home Levothyroxine   Essential Hypertension - Not on any outpatient medication   GERD - Protonix   H/o RA --on methotrexate and folic acid --follows with Dr Allena Katz at [facility]    Hypokalemia Hypomag --monitor and supplement PRN     Family communication :  Consults : G.I., general surgery, urology CODE STATUS: full DVT Prophylaxis  :Lovenox Status is: Inpatient Remains inpatient appropriate because: on IV abx, waiting for colostomy education     TOTAL TIME TAKING CARE OF THIS PATIENT: 35 minutes.  >50% time spent on counselling and coordination of care  Note: This dictation was prepared with Dragon dictation along with smaller phrase technology. Any transcriptional errors that result from this process are unintentional.  Enedina Finner M.D    [facility]   CC: Primary care physician; Danella Penton, MD

## 2022-10-15 NOTE — Plan of Care (Signed)
  Problem: Activity: Goal: Ability to avoid complications of mobility impairment will improve Outcome: Progressing   Problem: Activity: Goal: Range of joint motion will improve Outcome: Progressing   Problem: Clinical Measurements: Goal: Postoperative complications will be avoided or minimized Outcome: Progressing   Problem: Pain Management: Goal: Pain level will decrease with appropriate interventions Outcome: Progressing   Problem: Skin Integrity: Goal: Will show signs of wound healing Outcome: Progressing

## 2022-10-15 NOTE — Progress Notes (Signed)
10/15/2022  Subjective: Patient is 2 Days Post-Op status post Hartman's procedure with takedown of colovesical and coloenteric fistulas.  No acute events overnight.  Patient's WBC improving to 12 down from 15.4.  Patient reports that her pain is doing okay.  Denies any nausea or vomiting.  She is now hearing gas coming out into the ostomy appliance.  Vital signs: Temp:  [98 F (36.7 C)-98.3 F (36.8 C)] 98 F (36.7 C) (10/06 0820) Pulse Rate:  [84-91] 87 (10/06 0820) Resp:  [17-18] 17 (10/06 0820) BP: (112-127)/(67-77) 127/75 (10/06 0820) SpO2:  [97 %-100 %] 97 % (10/06 0820)   Intake/Output: 10/05 0701 - 10/06 0700 In: 701.9 [P.O.:240; I.V.:269.5; IV Piggyback:192.4] Out: 745 [Urine:650; Drains:95] Last BM Date : 10/13/22  Physical Exam: Constitutional: No acute distress Abdomen: Soft, nondistended, appropriate short palpation.  Midline incision is clean, dry, intact with staples in place.  Left-sided ostomy with some mucosal edema which is improving but now with gas and liquid stool in the bag.  JP drain with serosanguineous fluid.  Nephrostomy tube and Foley with yellow clear urine  Labs:  Recent Labs    10/14/22 0420 10/15/22 0343  WBC 15.4* 12.0*  HGB 8.3* 8.2*  HCT 26.3* 25.3*  PLT 333 357   Recent Labs    10/14/22 0420 10/15/22 0343  NA 137 138  K 3.5 3.7  CL 106 106  CO2 20* 23  GLUCOSE 108* 98  BUN 14 13  CREATININE 0.99 0.86  CALCIUM 7.5* 7.4*   No results for input(s): "LABPROT", "INR" in the last 72 hours.  Imaging: No results found.  Assessment/Plan: This is a 89 y.o. 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  - Patient has regained full bowel function now and is having gas and liquid stool in the ostomy.  Will advance her diet to a soft diet today. - Continue Foley catheter today given the colovesical fistula. - Nephrostomy tube per urology team. - Continue working with PT and OT to determine  appropriate disposition.  Anticipate patient potentially being ready for discharge in the next couple of days.   Howie Ill, MD [facility]

## 2022-10-16 DIAGNOSIS — N179 Acute kidney failure, unspecified: Secondary | ICD-10-CM | POA: Diagnosis not present

## 2022-10-16 DIAGNOSIS — K632 Fistula of intestine: Secondary | ICD-10-CM | POA: Diagnosis not present

## 2022-10-16 DIAGNOSIS — A419 Sepsis, unspecified organism: Secondary | ICD-10-CM | POA: Diagnosis not present

## 2022-10-16 DIAGNOSIS — D649 Anemia, unspecified: Secondary | ICD-10-CM | POA: Diagnosis not present

## 2022-10-16 LAB — CBC
HCT: 26.2 % — ABNORMAL LOW (ref 36.0–46.0)
Hemoglobin: 8.4 g/dL — ABNORMAL LOW (ref 12.0–15.0)
MCH: 28.4 pg (ref 26.0–34.0)
MCHC: 32.1 g/dL (ref 30.0–36.0)
MCV: 88.5 fL (ref 80.0–100.0)
Platelets: 348 10*3/uL (ref 150–400)
RBC: 2.96 MIL/uL — ABNORMAL LOW (ref 3.87–5.11)
RDW: 14.6 % (ref 11.5–15.5)
WBC: 9.9 10*3/uL (ref 4.0–10.5)
nRBC: 0 % (ref 0.0–0.2)

## 2022-10-16 MED ORDER — ENOXAPARIN SODIUM 40 MG/0.4ML IJ SOSY
40.0000 mg | PREFILLED_SYRINGE | INTRAMUSCULAR | Status: DC
  Administered 2022-10-16: 40 mg via SUBCUTANEOUS
  Filled 2022-10-16: qty 0.4

## 2022-10-16 MED ORDER — AMOXICILLIN-POT CLAVULANATE 875-125 MG PO TABS
1.0000 | ORAL_TABLET | Freq: Two times a day (BID) | ORAL | Status: DC
  Administered 2022-10-17: 1 via ORAL
  Filled 2022-10-16: qty 1

## 2022-10-16 NOTE — Plan of Care (Signed)
  Problem: Activity: Goal: Ability to avoid complications of mobility impairment will improve Outcome: Progressing Goal: Range of joint motion will improve Outcome: Progressing   Problem: Pain Management: Goal: Pain level will decrease with appropriate interventions Outcome: Progressing   Problem: Clinical Measurements: Goal: Diagnostic test results will improve Outcome: Progressing Goal: Signs and symptoms of infection will decrease Outcome: Progressing   Problem: Clinical Measurements: Goal: Ability to maintain clinical measurements within normal limits will improve Outcome: Progressing Goal: Will remain free from infection Outcome: Progressing Goal: Diagnostic test results will improve Outcome: Progressing Goal: Respiratory complications will improve Outcome: Progressing Goal: Cardiovascular complication will be avoided Outcome: Progressing   Problem: Safety: Goal: Ability to remain free from injury will improve Outcome: Progressing   Problem: Skin Integrity: Goal: Risk for impaired skin integrity will decrease Outcome: Progressing

## 2022-10-16 NOTE — Discharge Instructions (Signed)
Open Partial Colectomy, Adult, Care After The following information offers guidance on how to care for yourself after your procedure. Your health care provider may also give you more specific instructions. If you have problems or questions, contact your health care provider. What can I expect after the surgery? After the procedure, it is common to have: Pain, bruising, or swelling in your abdomen, especially in the incision areas. You will be given medicine to control the pain. Tiredness. Your energy level will return to normal over the next several weeks. Changes in your bowel movements, especially having bowel movements more often. Bloating. Nausea. Trouble urinating. Follow these instructions at home: Medicines Take over-the-counter and prescription medicines only as told by your health care provider. Ask your health care provider if the medicine prescribed to you: Requires you to avoid driving or using machinery. Can cause constipation. You may need to take these actions to prevent or treat constipation: Drink enough fluids to keep your urine pale yellow. Take over-the-counter or prescription medicines, including stool softeners. Limit foods that are high in fat and processed sugars, such as fried or sweet foods. Eating and drinking Follow instructions from your health care provider about eating or drinking restrictions. Do not drink alcohol if your health care provider tells you not to drink. Eat a low-fiber diet for the first 4 weeks after surgery. Most people on a low-fiber eating plan should eat less than 10 grams (g) of fiber a day. Follow recommendations from your health care provider or dietician about how much fiber you should have each day. Always check food labels to know the fiber content of packaged foods. In general, a low-fiber food will have fewer than 2 g of fiber per serving. In general, try to avoid whole grains, raw fruits and vegetables, dried fruit, tough cuts of  meat, nuts, and seeds. Incision care  Follow instructions from your health care provider about how to take care of your incision. Make sure you: Wash your hands with soap and water for at least 20 seconds before and after you change your bandage (dressing). If soap and water are not available, use hand sanitizer. Change your dressing as told by your health care provider. Leave stitches (sutures), skin glue, or adhesive strips in place. These skin closures may need to stay in place for 2 weeks or longer. If adhesive strip edges start to loosen and curl up, you may trim the loose edges. Do not remove adhesive strips completely unless your health care provider tells you to do that. Check your incision area every day for signs of infection. Check for: More redness, swelling, or pain. Fluid or blood. Warmth. Pus or a bad smell. Keep your incisions clean and dry. Avoid wearing tight clothing around your incision. Do not take baths, swim, or use a hot tub until your health care provider approves. Ask your health care provider if you may take showers. You may only be allowed to take sponge baths. Activity  Rest as told by your health care provider. Avoid sitting for a long time without moving. Get up and take short walks every 1-2 hours. This is important to improve blood flow and breathing. Ask for help if you feel weak or unsteady. You may have to avoid lifting. Ask your health care provider how much you can safely lift. Do deep breathing exercises as told by your health care provider. Place your hands or a pillow over your incision area before you cough. Return to your normal activities as told  by your health care provider. Ask your health care provider what activities are safe for you. General instructions Do not use any products that contain nicotine or tobacco. These products include cigarettes, chewing tobacco, and vaping devices, such as e-cigarettes. If you need help quitting, ask your health  care provider. Wear compression stockings as told by your health care provider. These stockings help to prevent blood clots and reduce swelling in your legs. Keep all follow-up visits. This is important to monitor healing and check for any complications. Contact a health care provider if: You have not had a bowel movement in 3 days after surgery. You have chills or fever. Medicine is not controlling your pain. You have any signs of infection at your incision area. You have nausea and vomiting that will not go away. Get help right away if: You have chest pain or trouble breathing. You have a warm, tender swelling in your leg. Your incision breaks open. You have severe pain. You have bleeding from the rectum. These symptoms may be an emergency. Get help right away. Call 911. Do not wait to see if the symptoms will go away. Do not drive yourself to the hospital. Summary After your procedure, it is common to have pain and to be tired. Your incision area may also be sore and tender. Take over-the-counter and prescription medicines only as told by your health care provider. Return to your normal activities as told by your health care provider. Follow instructions from your health care provider about how to take care of your incision. Get help right away if you have chest pain or trouble breathing. This information is not intended to replace advice given to you by your health care provider. Make sure you discuss any questions you have with your health care provider. Document Revised: 04/13/2021 Document Reviewed: 04/13/2021 Elsevier Patient Education  2024 [employer].

## 2022-10-16 NOTE — Progress Notes (Signed)
[facility]  - [place] at [facility]   PATIENT NAME: [name]    MR#:  [id number]  DATE OF BIRTH:  [birth year]  SUBJECTIVE:  no family at bedside when seen earlier. Spoke with patient's brother and sister-in-law on the phone == Patient overall improving slowly. Tolerating diet. Worked with physical therapy. Denies any other complaints. Colostomy education to be done today    VITALS:  Blood pressure 135/73, pulse 88, temperature 98.2 F (36.8 C), temperature source Oral, resp. rate 18, height 5\' 2"  (1.575 m), weight 53 kg, SpO2 97%.  PHYSICAL EXAMINATION:   GENERAL:  89 y.o.-year-old patient with no acute distress.  LUNGS: Normal breath sounds bilaterally, no wheezing CARDIOVASCULAR: S1, S2 normal. No murmur   ABDOMEN: left nephrostomy drain, JP drain, Foley catheter, midline incision, colostomy EXTREMITIES: No  edema b/l.    NEUROLOGIC: nonfocal  patient is alert and awake   LABORATORY PANEL:  CBC Recent Labs  Lab 10/16/22 0430  WBC 9.9  HGB 8.4*  HCT 26.2*  PLT 348    Chemistries  Recent Labs  Lab 10/10/22 0934 10/11/22 0511 10/15/22 0343  NA 133*   < > 138  K 3.7   < > 3.7  CL 97*   < > 106  CO2 23   < > 23  GLUCOSE 119*   < > 98  BUN 28*   < > 13  CREATININE 1.48*   < > 0.86  CALCIUM 8.6*   < > 7.4*  MG  --    < > 2.0  AST 13*  --   --   ALT 14  --   --   ALKPHOS 86  --   --   BILITOT 0.7  --   --    < > = values in this interval not displayed.   Assessment and Plan   [name] is a 89 y.o. female with medical history significant of hypothyroidism, thyroid cancer status post partial thyroidectomy, essential hypertension not on any home medications, C. difficile colitis, and GERD.  She presents to [facility] ED after getting a call from her PCP who ordered an abdominal  CT abdomen pelvis shows enterocolonic and colocolonic fistulas with associated inflammatory changes, cystitis with suspected colovesical fistula, and  moderate to severe left hydroureteronephrosis  patient complained of abdominal pain on an offer last two months.   Sepsis with Intra-abdominal Source Multiple Colonic Fistulas colovesical fistula  Acute Cystitis/left severe hydronephrosis --HR 110, WBC 22.2;  --CT Abdomen Pelvis shows enterocolonic and colocolonic fistulas with associated inflammatory changes, No fluid collection/abscess, no free air. 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 -- urine culture-- >100K  gram-negative rods -- blood culture negative - Gastroenterology consulted for multiple colonic fistulas concerning for inflammatory bowel process, appreciate their consultation and recommendations-- G.I. signed off. 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  --cont Zosyn --cont Full liquid diet, advance per GenSurg --cont Foley given colovesical fistula  --seen by PT/OT--rehab however pt wants to go home -- discussed with patient regarding discharge planning to rehab given significant weakness, colostomy management, left kidney drain, JP drain. Patient did voice understanding and agreeable now. Social worker informed. -- Okay to discontinue Foley catheter per Dr. Elenor Legato. Leave JP drain in for now. Colostomy education done by wound care. -- Completed seven days  of IV antibiotic with Zosyn. General surgery recommends five more days with PO antibiotic. Augmentin started.   Acute Kidney Injury due to external Obstructive Uropathy --Likely post renal; moderate to severe (L) hydroureteronephrosis related to compression by inflammatory changes in bowel seen on CT Abdomen/Pelvis --d/c MIVF, encourage oral hydration -- urology Dr. Lonna Cobb patient will discharge with left nephrostomy drain--  she will follow-up outpatient with nephrostogram down the road.   Normocytic Anemia --stable   Thrombocytosis Appears chronic in nature, PLT was 463 on 06/19/22 in the [facility] system   Hypothyroidism  Hx of Thyroid Cancer s/p Partial Thyroidectomy -Continue home Levothyroxine   Essential Hypertension - Not on any outpatient medication   GERD - Protonix   H/o RA --on methotrexate and folic acid --follows with Dr Allena Katz at [facility]    Hypokalemia Hypomag --monitor and supplement PRN     [facility] General surgery and urology is okay with discharge plan.  Family communication : brother and sister-in-law on the phone Consults : G.I., general surgery, urology CODE STATUS: full DVT Prophylaxis :Lovenox Status is: Inpatient Remains inpatient appropriate because:, waiting for colostomy education     TOTAL TIME TAKING CARE OF THIS PATIENT: 35 minutes.  >50% time spent on counselling and coordination of care  Note: This dictation was prepared with Dragon dictation along with smaller phrase technology. Any transcriptional errors that result from this process are unintentional.  Enedina Finner M.D    [facility]   CC: Primary care physician; Danella Penton, MD

## 2022-10-16 NOTE — Care Management Important Message (Signed)
Important Message  Patient Details  Name: [name] MRN: [id number] Date of Birth: 09/23/1933   Important Message Given:  Yes - Medicare IM     Alvie Fowles, Stephan Minister 10/16/2022, 3:33 PM

## 2022-10-16 NOTE — Progress Notes (Signed)
Physical Therapy Treatment Patient Details Name: [name] MRN: [id number] DOB: 02/17/1933 Today's Date: 10/16/2022   History of Present Illness Pt is an 89 y.o. female with medical history significant of hypothyroidism, thyroid cancer status post partial thyroidectomy, essential hypertension not on any home medications, C. difficile colitis, and GERD.  She presents to [facility] ED after getting a call from her PCP who ordered an abdominal   CT abdomen pelvis shows enterocolonic and colocolonic fistulas with associated inflammatory changes, cystitis with suspected colovesical fistula, and moderate to severe left hydroureteronephrosis. S/p nephrostomy drain and exploratory laparatomy with colostomy creation.    PT Comments  Pt was pleasant and motivated to participate during the session and put forth good effort throughout. Pt min A to sit fully upright in bed. CGA for transfers but needs VC's for safe sequencing and hand placement. Once up pt reports feeling unsteady but that sensation quickly went away. She was able to amb 200 feet with RW, walking with forward flexed posture and heavy lean on RW able to temporarily correct with VC's. At times pt reports feeling unsteady when walking and endorses this is different than her baseline. Additionally walks with NBOS but makes minimal changes to step width. Pt will benefit from continued PT services upon discharge to safely address deficits listed in patient problem list for decreased caregiver assistance and eventual return to PLOF.      If plan is discharge home, recommend the following: A little help with walking and/or transfers;A little help with bathing/dressing/bathroom;Assistance with cooking/housework;Assist for transportation;Direct supervision/assist for medications management;Help with stairs or ramp for entrance   Can travel by private vehicle     Yes  Equipment Recommendations  Other (comment) (TBD at next venue of care)     Recommendations for Other Services       Precautions / Restrictions Precautions Precautions: Fall Precaution Comments: jp drain, colostomy, nephrostomy Restrictions Weight Bearing Restrictions: No     Mobility  Bed Mobility Overal bed mobility: Needs Assistance Bed Mobility: Rolling, Sidelying to Sit Rolling: Contact guard assist Sidelying to sit: Min assist       General bed mobility comments: log roll technique    Transfers Overall transfer level: Needs assistance Equipment used: Rolling walker (2 wheels) Transfers: Sit to/from Stand Sit to Stand: Contact guard assist   Step pivot transfers: Contact guard assist       General transfer comment: Cues for hand placement and general sequencing. One up pt reorts being unsteady, but no imbalance. Needs increased time and VC's for general safety and drain management.    Ambulation/Gait Ambulation/Gait assistance: Contact guard assist Gait Distance (Feet): 200 Feet Assistive device: Rolling walker (2 wheels) Gait Pattern/deviations: Step-through pattern, Decreased step length - right, Decreased step length - left, Decreased stride length, Trunk flexed, Narrow base of support Gait velocity: decreased     General Gait Details: Pt walks slowly with forwards flexed posture, she is able to temporarily correct when cued. Walks with a slight NBOS but no imbalance noted. Takes very careful steps, pt endorses feeling slightly unsteady during amb.   Stairs             Wheelchair Mobility     Tilt Bed    Modified Rankin (Stroke Patients Only)       Balance Overall balance assessment: Needs assistance Sitting-balance support: Feet supported Sitting balance-Leahy Scale: Fair     Standing balance support: During functional activity Standing balance-Leahy Scale: Fair  Cognition Arousal: Alert Behavior During Therapy: WFL for tasks assessed/performed Overall Cognitive  Status: No family/caregiver present to determine baseline cognitive functioning                                          Exercises Other Exercises Other Exercises: education on drain management/locaitons during mobility, pt verbalizing understanding    General Comments        Pertinent Vitals/Pain Pain Assessment Pain Assessment: Faces Faces Pain Scale: Hurts a little bit Pain Location: generalized stomach area Pain Descriptors / Indicators: Discomfort, Guarding Pain Intervention(s): Monitored during session, Limited activity within patient's tolerance    Home Living                          Prior Function            PT Goals (current goals can now be found in the care plan section) Progress towards PT goals: Progressing toward goals    Frequency    Min 1X/week      PT Plan      Co-evaluation              AM-PAC PT "6 Clicks" Mobility   Outcome Measure  Help needed turning from your back to your side while in a flat bed without using bedrails?: A Little Help needed moving from lying on your back to sitting on the side of a flat bed without using bedrails?: A Little Help needed moving to and from a bed to a chair (including a wheelchair)?: A Little Help needed standing up from a chair using your arms (e.g., wheelchair or bedside chair)?: A Little Help needed to walk in hospital room?: A Little Help needed climbing 3-5 steps with a railing? : A Lot 6 Click Score: 17    End of Session Equipment Utilized During Treatment: Gait belt Activity Tolerance: Patient tolerated treatment well Patient left: in chair;with call bell/phone within reach;with chair alarm set Nurse Communication: Mobility status PT Visit Diagnosis: Other abnormalities of gait and mobility (R26.89);Muscle weakness (generalized) (M62.81);Difficulty in walking, not elsewhere classified (R26.2);Pain Pain - Right/Left:  (midline) Pain - part of body:  (abdomen)      Time: 2841-3244 PT Time Calculation (min) (ACUTE ONLY): 40 min  Charges:                            Cecile Sheerer, SPT 10/16/22, 5:26 PM

## 2022-10-16 NOTE — Consult Note (Signed)
WOC Nurse ostomy consult note; LLQ colostomy placed by Dr. Everlene Farrier 10/13/2022  Stoma type/location:  LLQ end colostomy  Stomal assessment/size: 1 1/2", slightly prolapsed, pink moist edematous, round  Peristomal assessment: intact, patient has a midline incision with staples and penrose drain to right of ostomy, trimmed skin barrier to avoid incision; also has a JP drain to left of ostomy  Treatment options for stomal/peristomal skin:  2" barrier ring  Output approximately 50 mls brown effluent in pouch  Ostomy pouching: 2 piece 2 1/4" skin barrier [place] [phone]), 2 1/4" pouch [place] [phone]) and 2" barrier ring [place] [phone])  Education provided:  Patient and sister in law (retired Surveyor, mining) engaged for teaching.  We discussed emptying pouch several times a day when 1/3 to 1/2 full.  Demonstrated using lock and roll mechanism to open pouch, clean spout with toilet paper wick and close pouch.  We discussed that pouches are odor proof and patient should not smell stool if pouch is secure and there is no leak. Discussed that patient can empty directly into toilet at home.     We discussed changing entire pouching system 2 times a week or if needed for leaking.  Demonstrated removing pouch using push pull technique. Discussed only cleaning around stoma with water moistened washcloth. Sized stoma today at 1 1/2" round. Stoma is edematous and there is prolapse noted. We discussed that size of stoma may change as edema subsides.  Discussed sizing with pouch changes for first month.    I cut new skin barrier at 1 1/2" round.  Demonstrated how to stretch 2" barrier ring and place around stoma, then secured new skin barrier to barrier ring and snapped new pouch on.  Closed pouch.    Discussed bathing with pouch on or off when home.   Patient and sister in law attentive and asked appropriate questions but did not provide any hands on care today.  Sister in law has experience with care of ostomy and will be a  good resource for patient. Patient is planning to discharge to SNF for rehab.  Reviewed educational materials including step by step instructions on pouch change like we did today.    Sent a bag with supplies home with sister in law including (4) 2 1/4" skin barriers, 2 1/4" pouches and 2" barrier rings.    Enrolled patient in [facility] DC program: Yes, brothers address and phone number [address] [place], [place] [address] [phone]  WOC team will continue to follow for education and support as long as remains inpatient.  Patient needs to provide hands on care at next visit.   Thank you,    Priscella Mann MSN, RN-BC, [employer] [phone]

## 2022-10-16 NOTE — [place] FL2 (Signed)
[place] MEDICAID FL2 LEVEL OF CARE FORM     IDENTIFICATION  Patient Name: DANIELLE GAMBINO Birthdate: [birth year] Sex: female Admission Date (Current Location): 10/10/2022  [facility] and [place] Number:  Chiropodist and Address:         Provider Number: [id number]  Attending Physician Name and Address:  Enedina Finner, MD  Relative Name and Phone Number:       Current Level of Care: Hospital Recommended Level of Care: Skilled Nursing Facility Prior Approval Number:    Date Approved/Denied:   PASRR Number:    Discharge Plan: SNF    Current Diagnoses: Patient Active Problem List   Diagnosis Date Noted   Sepsis (HCC) 10/10/2022   AKI (acute kidney injury) (HCC) 10/10/2022   Normocytic anemia 10/10/2022   Thrombocytosis 10/10/2022   Cystitis 10/10/2022   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 10/10/2022   Hypothyroidism 10/10/2022   Total knee replacement status 03/24/2022   Primary osteoarthritis of left knee 02/12/2022   Papillary thyroid carcinoma (HCC) 12/09/2019   S/P partial thyroidectomy 11/28/2019   Thyroid nodule    Hyperlipidemia, mixed 08/18/2019   Rheumatoid arthritis involving multiple sites with positive rheumatoid factor (HCC) 08/18/2019   Essential hypertension with goal blood pressure less than 140/90 05/26/2014    Orientation RESPIRATION BLADDER Height & Weight     Self, Time, Situation, Place  Normal Continent Weight: 53 kg Height:  5\' 2"  (157.5 cm)  BEHAVIORAL SYMPTOMS/MOOD NEUROLOGICAL BOWEL NUTRITION STATUS      Colostomy Diet (Regular)  AMBULATORY STATUS COMMUNICATION OF NEEDS Skin   Extensive Assist Verbally Other (Comment), Surgical wounds (JP drain, nephrostomy)                       Personal Care Assistance Level of Assistance              Functional Limitations Info             SPECIAL CARE FACTORS FREQUENCY  PT (By licensed PT), OT (By licensed OT)                    Contractures  Contractures Info: Not present    Additional Factors Info  Code Status, Allergies Code Status Info: Full Allergies Info: indomethacin           Current Medications (10/16/2022):  This is the current hospital active medication list Current Facility-Administered Medications  Medication Dose Route Frequency Provider Last Rate Last Admin   0.9 %  sodium chloride infusion   Intravenous PRN Leafy Ro, MD   Stopped at 10/14/22 0115   acetaminophen (TYLENOL) tablet 1,000 mg  1,000 mg Oral Q6H Pabon, Diego F, MD   1,000 mg at 10/16/22 0634   [START ON 10/17/2022] amoxicillin-clavulanate (AUGMENTIN) 875-125 MG per tablet 1 tablet  1 tablet Oral Q12H Enedina Finner, MD       Chlorhexidine Gluconate Cloth 2 % PADS 6 each  6 each Topical Daily Enedina Finner, MD   6 each at 10/16/22 0842   enoxaparin (LOVENOX) injection 40 mg  40 mg Subcutaneous Q24H Tressie Ellis, RPH       levothyroxine (SYNTHROID) tablet 44 mcg  44 mcg Oral Weekly Sterling Big F, MD   44 mcg at 10/14/22 0559   levothyroxine (SYNTHROID) tablet 88 mcg  88 mcg Oral Once per day on Sunday Monday Tuesday Wednesday Thursday Friday Leafy Ro, MD   88 mcg at 10/16/22 [phone]  melatonin tablet 2.5 mg  2.5 mg Oral QHS PRN Pabon, Diego F, MD       morphine (PF) 2 MG/ML injection 2 mg  2 mg Intravenous Q4H PRN Darlin Priestly, MD       ondansetron [facility]) tablet 4 mg  4 mg Oral Q6H PRN Pabon, Diego F, MD       Or   ondansetron (ZOFRAN) injection 4 mg  4 mg Intravenous Q6H PRN Pabon, Diego F, MD       oxyCODONE (Oxy IR/ROXICODONE) immediate release tablet 5-10 mg  5-10 mg Oral Q4H PRN Darlin Priestly, MD       pantoprazole (PROTONIX) EC tablet 40 mg  40 mg Oral Daily Pabon, [place] F, MD   40 mg at 10/16/22 0840   piperacillin-tazobactam (ZOSYN) IVPB 3.375 g  3.375 g Intravenous Hazle Quant, MD 12.5 mL/hr at 10/16/22 0841 3.375 g at 10/16/22 4742     Discharge Medications: Please see discharge summary for a list of discharge  medications.  Relevant Imaging Results:  Relevant Lab Results:   Additional Information ss [id number]  Chapman Fitch, RN

## 2022-10-16 NOTE — TOC Progression Note (Signed)
Transition of Care [facility]) - Progression Note    Patient Details  Name: [name] MRN: [id number] Date of Birth: [birth year]  Transition of Care [facility]) CM/SW Contact  Chapman Fitch, RN Phone Number: 10/16/2022, 10:53 AM  Clinical Narrative:        Notified by MD that patient now agreeable to SNF PASRR obtained  Fl2 sent for signature Bed search initiated     Expected Discharge Plan and Services         Expected Discharge Date: 10/16/22                                     Social Determinants of Health (SDOH) Interventions SDOH Screenings   Food Insecurity: No Food Insecurity (10/10/2022)  Housing: Low Risk  (10/10/2022)  Transportation Needs: No Transportation Needs (10/10/2022)  Utilities: Not At Risk (10/10/2022)  Financial Resource Strain: Low Risk  (10/09/2022)   Received from [facility]  Tobacco Use: Medium Risk (10/13/2022)    Readmission Risk Interventions     No data to display

## 2022-10-16 NOTE — Progress Notes (Signed)
CC: POD # 3 Subjective: Doing very well, had full breakfast w eggs and bacon. Ostomy working well Nml wbc  Objective: Vital signs in last 24 hours: Temp:  [97.8 F (36.6 C)-98.3 F (36.8 C)] 98.2 F (36.8 C) (10/07 0749) Pulse Rate:  [87-89] 88 (10/07 0749) Resp:  [16-20] 18 (10/07 0749) BP: (100-135)/(64-83) 135/73 (10/07 0749) SpO2:  [96 %-97 %] 97 % (10/07 0749) Last BM Date : 10/16/22  Intake/Output from previous day: 10/06 0701 - 10/07 0700 In: 802.5 [P.O.:480; IV Piggyback:322.5] Out: 1750 [Urine:1350; Drains:100; Stool:300] Intake/Output this shift: Total I/O In: 240 [P.O.:240] Out: 795 [Urine:775; Drains:20]  Physical exam:  GENERAL:  no acute distress.  ABDOMEN: left nephrostomy drain, JP drain, serous, midline incision w staples, penrose removed.  Colostomy pink and patent EXTREMITIES: No  edema b/l.    NEUROLOGIC: nonfocal  patient is alert and awake   Lab Results: CBC  Recent Labs    10/15/22 0343 10/16/22 0430  WBC 12.0* 9.9  HGB 8.2* 8.4*  HCT 25.3* 26.2*  PLT 357 348   BMET Recent Labs    10/14/22 0420 10/15/22 0343  NA 137 138  K 3.5 3.7  CL 106 106  CO2 20* 23  GLUCOSE 108* 98  BUN 14 13  CREATININE 0.99 0.86  CALCIUM 7.5* 7.4*   PT/INR No results for input(s): "LABPROT", "INR" in the last 72 hours. ABG No results for input(s): "PHART", "HCO3" in the last 72 hours.  Invalid input(s): "PCO2", "PO2"  Studies/Results: No results found.  Anti-infectives: Anti-infectives (From admission, onward)    Start     Dose/Rate Route Frequency Ordered Stop   10/17/22 1000  amoxicillin-clavulanate (AUGMENTIN) 875-125 MG per tablet 1 tablet        1 tablet Oral Every 12 hours 10/16/22 1006 10/22/22 0959   10/12/22 1400  metroNIDAZOLE (FLAGYL) tablet 500 mg        500 mg Oral Every 6 hours 10/12/22 0924 10/12/22 2359   10/12/22 1400  neomycin (MYCIFRADIN) tablet 1,000 mg        1,000 mg Oral Every 6 hours 10/12/22 0924 10/12/22 2359    10/12/22 0830  metroNIDAZOLE (FLAGYL) tablet 500 mg  Status:  Discontinued        500 mg Oral Every 6 hours 10/12/22 0724 10/12/22 0924   10/11/22 1800  neomycin (MYCIFRADIN) tablet 1,000 mg  Status:  Discontinued        1,000 mg Oral Every 6 hours 10/11/22 1703 10/12/22 0924   10/11/22 1700  piperacillin-tazobactam (ZOSYN) IVPB 3.375 g        3.375 g 12.5 mL/hr over 240 Minutes Intravenous Every 8 hours 10/11/22 1649 10/16/22 2359   10/11/22 1000  cefTRIAXone (ROCEPHIN) 2 g in sodium chloride 0.9 % 100 mL IVPB  Status:  Discontinued        2 g 200 mL/hr over 30 Minutes Intravenous Every 24 hours 10/10/22 1157 10/11/22 1648   10/10/22 2200  metroNIDAZOLE (FLAGYL) IVPB 500 mg  Status:  Discontinued        500 mg 100 mL/hr over 60 Minutes Intravenous Every 12 hours 10/10/22 1157 10/11/22 1648   10/10/22 1045  cefTRIAXone (ROCEPHIN) 2 g in sodium chloride 0.9 % 100 mL IVPB        2 g 200 mL/hr over 30 Minutes Intravenous  Once 10/10/22 1032 10/10/22 1159   10/10/22 1045  metroNIDAZOLE (FLAGYL) IVPB 500 mg        500 mg 100 mL/hr over 60  Minutes Intravenous  Once 10/10/22 1032 10/10/22 1315   10/10/22 1030  piperacillin-tazobactam (ZOSYN) IVPB 3.375 g  Status:  Discontinued        3.375 g 100 mL/hr over 30 Minutes Intravenous  Once 10/10/22 1016 10/10/22 1032       Assessment/Plan: Doing very well DC foley Daily dry dressing Continue JP May DC to SNF or [facility]   Sterling Big, MD, [facility]  10/16/2022

## 2022-10-16 NOTE — TOC Progression Note (Signed)
Transition of Care [facility]) - Progression Note    Patient Details  Name: [name] MRN: [id number] Date of Birth: 28-Jul-1933  Transition of Care [facility]) CM/SW Contact  Chapman Fitch, RN Phone Number: 10/16/2022, 2:50 PM  Clinical Narrative:      Bed offers reviewed with patient. CMS Medicare.gov Compare Post Acute Care list reviewed with patient. Patient accepts bed at [facility].  Accepted bed in [facility], notified [facility] at [facility]       Expected Discharge Plan and Services         Expected Discharge Date: 10/16/22                                     Social Determinants of Health (SDOH) Interventions SDOH Screenings   Food Insecurity: No Food Insecurity (10/10/2022)  Housing: Low Risk  (10/10/2022)  Transportation Needs: No Transportation Needs (10/10/2022)  Utilities: Not At Risk (10/10/2022)  Financial Resource Strain: Low Risk  (10/09/2022)   Received from [facility]  Tobacco Use: Medium Risk (10/13/2022)    Readmission Risk Interventions     No data to display

## 2022-10-17 DIAGNOSIS — K5732 Diverticulitis of large intestine without perforation or abscess without bleeding: Secondary | ICD-10-CM

## 2022-10-17 DIAGNOSIS — K632 Fistula of intestine: Secondary | ICD-10-CM | POA: Diagnosis not present

## 2022-10-17 DIAGNOSIS — A419 Sepsis, unspecified organism: Secondary | ICD-10-CM | POA: Diagnosis not present

## 2022-10-17 DIAGNOSIS — N179 Acute kidney failure, unspecified: Secondary | ICD-10-CM | POA: Diagnosis not present

## 2022-10-17 DIAGNOSIS — N3090 Cystitis, unspecified without hematuria: Secondary | ICD-10-CM | POA: Diagnosis not present

## 2022-10-17 LAB — SURGICAL PATHOLOGY

## 2022-10-17 MED ORDER — ADULT MULTIVITAMIN W/MINERALS CH
1.0000 | ORAL_TABLET | Freq: Every day | ORAL | Status: DC
  Administered 2022-10-17: 1 via ORAL
  Filled 2022-10-17: qty 1

## 2022-10-17 MED ORDER — PANTOPRAZOLE SODIUM 40 MG PO TBEC: 40.0000 mg | DELAYED_RELEASE_TABLET | Freq: Every day | ORAL | Status: DC

## 2022-10-17 MED ORDER — FOLIC ACID 1 MG PO TABS
1.0000 mg | ORAL_TABLET | Freq: Every day | ORAL | Status: DC
  Administered 2022-10-17: 1 mg via ORAL
  Filled 2022-10-17: qty 1

## 2022-10-17 MED ORDER — OYSTER SHELL CALCIUM/D3 500-5 MG-MCG PO TABS
1.0000 | ORAL_TABLET | Freq: Every day | ORAL | Status: DC
  Administered 2022-10-17: 1 via ORAL
  Filled 2022-10-17: qty 1

## 2022-10-17 MED ORDER — OXYCODONE HCL 5 MG PO TABS: 5.0000 mg | ORAL_TABLET | Freq: Four times a day (QID) | ORAL | 0 refills | Status: DC | PRN

## 2022-10-17 MED ORDER — FOLIC ACID 1 MG PO TABS: 1.0000 mg | ORAL_TABLET | Freq: Every day | ORAL | 2 refills | Status: AC

## 2022-10-17 MED ORDER — AMOXICILLIN-POT CLAVULANATE 875-125 MG PO TABS: 1.0000 | ORAL_TABLET | Freq: Two times a day (BID) | ORAL | 0 refills | Status: AC

## 2022-10-17 NOTE — Plan of Care (Signed)
  Problem: Education: Goal: Knowledge of the prescribed therapeutic regimen will improve Outcome: Progressing Goal: Individualized Educational Video(s) Outcome: Progressing   Problem: Activity: Goal: Ability to avoid complications of mobility impairment will improve Outcome: Progressing Goal: Range of joint motion will improve Outcome: Progressing   Problem: Clinical Measurements: Goal: Postoperative complications will be avoided or minimized Outcome: Progressing   Problem: Pain Management: Goal: Pain level will decrease with appropriate interventions Outcome: Progressing   Problem: Skin Integrity: Goal: Will show signs of wound healing Outcome: Progressing   Problem: Fluid Volume: Goal: Hemodynamic stability will improve Outcome: Progressing   Problem: Clinical Measurements: Goal: Diagnostic test results will improve Outcome: Progressing Goal: Signs and symptoms of infection will decrease Outcome: Progressing   Problem: Respiratory: Goal: Ability to maintain adequate ventilation will improve Outcome: Progressing   Problem: Education: Goal: Knowledge of General Education information will improve Description: Including pain rating scale, medication(s)/side effects and non-pharmacologic comfort measures Outcome: Progressing   Problem: Health Behavior/Discharge Planning: Goal: Ability to manage health-related needs will improve Outcome: Progressing   Problem: Clinical Measurements: Goal: Ability to maintain clinical measurements within normal limits will improve Outcome: Progressing Goal: Will remain free from infection Outcome: Progressing Goal: Diagnostic test results will improve Outcome: Progressing Goal: Respiratory complications will improve Outcome: Progressing Goal: Cardiovascular complication will be avoided Outcome: Progressing   Problem: Activity: Goal: Risk for activity intolerance will decrease Outcome: Progressing   Problem: Nutrition: Goal:  Adequate nutrition will be maintained Outcome: Progressing   Problem: Coping: Goal: Level of anxiety will decrease Outcome: Progressing   Problem: Elimination: Goal: Will not experience complications related to bowel motility Outcome: Progressing Goal: Will not experience complications related to urinary retention Outcome: Progressing   Problem: Pain Managment: Goal: General experience of comfort will improve Outcome: Progressing   Problem: Safety: Goal: Ability to remain free from injury will improve Outcome: Progressing   Problem: Skin Integrity: Goal: Risk for impaired skin integrity will decrease Outcome: Progressing

## 2022-10-17 NOTE — Plan of Care (Signed)
  Problem: Education: Goal: Knowledge of the prescribed therapeutic regimen will improve Outcome: Adequate for Discharge Goal: Individualized Educational Video(s) Outcome: Adequate for Discharge   Problem: Activity: Goal: Ability to avoid complications of mobility impairment will improve Outcome: Adequate for Discharge Goal: Range of joint motion will improve Outcome: Adequate for Discharge   Problem: Clinical Measurements: Goal: Postoperative complications will be avoided or minimized Outcome: Adequate for Discharge   Problem: Pain Management: Goal: Pain level will decrease with appropriate interventions Outcome: Adequate for Discharge   Problem: Skin Integrity: Goal: Will show signs of wound healing Outcome: Adequate for Discharge   Problem: Fluid Volume: Goal: Hemodynamic stability will improve Outcome: Adequate for Discharge   Problem: Clinical Measurements: Goal: Diagnostic test results will improve Outcome: Adequate for Discharge Goal: Signs and symptoms of infection will decrease Outcome: Adequate for Discharge   Problem: Respiratory: Goal: Ability to maintain adequate ventilation will improve Outcome: Adequate for Discharge   Problem: Education: Goal: Knowledge of General Education information will improve Description: Including pain rating scale, medication(s)/side effects and non-pharmacologic comfort measures Outcome: Adequate for Discharge   Problem: Health Behavior/Discharge Planning: Goal: Ability to manage health-related needs will improve Outcome: Adequate for Discharge   Problem: Clinical Measurements: Goal: Ability to maintain clinical measurements within normal limits will improve Outcome: Adequate for Discharge Goal: Will remain free from infection Outcome: Adequate for Discharge Goal: Diagnostic test results will improve Outcome: Adequate for Discharge Goal: Respiratory complications will improve Outcome: Adequate for Discharge Goal:  Cardiovascular complication will be avoided Outcome: Adequate for Discharge   Problem: Activity: Goal: Risk for activity intolerance will decrease Outcome: Adequate for Discharge   Problem: Nutrition: Goal: Adequate nutrition will be maintained Outcome: Adequate for Discharge   Problem: Coping: Goal: Level of anxiety will decrease Outcome: Adequate for Discharge   Problem: Elimination: Goal: Will not experience complications related to bowel motility Outcome: Adequate for Discharge Goal: Will not experience complications related to urinary retention Outcome: Adequate for Discharge   Problem: Pain Managment: Goal: General experience of comfort will improve Outcome: Adequate for Discharge   Problem: Safety: Goal: Ability to remain free from injury will improve Outcome: Adequate for Discharge   Problem: Skin Integrity: Goal: Risk for impaired skin integrity will decrease Outcome: Adequate for Discharge    Pt has all belongings, EMS has received all DC paperwork. Pt report called to [facility] facility

## 2022-10-17 NOTE — TOC Transition Note (Addendum)
Transition of Care [facility]) - CM/SW Discharge Note   Patient Details  Name: [name] MRN: [id number] Date of Birth: Apr 10, 1933  Transition of Care [facility]) CM/SW Contact:  Darolyn Rua, LCSW Phone Number: 10/17/2022, 10:46 AM   Clinical Narrative:     Patient will DC QV:ZDGL Anticipated DC date:10/17/22 Family notified: sister Dawn - lvm Transport OV:FIEPP  Per MD patient ready for DC to [facility] . RN, patient, patient's family, and facility notified of DC. Discharge Summary sent to facility. RN given number for report   [phone]  605 B. DC packet on chart. Ambulance transport requested for patient.  CSW signing off.    Final next level of care: Skilled Nursing Facility Barriers to Discharge: No Barriers Identified   Patient Goals and CMS Choice CMS Medicare.gov Compare Post Acute Care list provided to:: Patient Choice offered to / list presented to : Patient  Discharge Placement                Patient chooses bed at: [facility] Patient to be transferred to facility by: [facility]   Patient and family notified of of transfer: 10/17/22  Discharge Plan and Services Additional resources added to the After Visit Summary for                                       Social Determinants of Health (SDOH) Interventions SDOH Screenings   Food Insecurity: No Food Insecurity (10/10/2022)  Housing: Low Risk  (10/10/2022)  Transportation Needs: No Transportation Needs (10/10/2022)  Utilities: Not At Risk (10/10/2022)  Financial Resource Strain: Low Risk  (10/09/2022)   Received [facility]  Tobacco Use: Medium Risk (10/13/2022)     Readmission Risk Interventions     No data to display

## 2022-10-17 NOTE — Discharge Summary (Signed)
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 female with medical history significant of hypothyroidism, thyroid cancer status post partial thyroidectomy, essential hypertension not on any home medications, C. difficile colitis, and GERD.  She presents to [facility] ED after getting a call from her PCP who ordered an abdominal  CT abdomen pelvis shows enterocolonic and colocolonic fistulas with associated inflammatory changes, cystitis with suspected colovesical fistula, and moderate to severe left hydroureteronephrosis  patient complained of abdominal pain on an offer last two months.   Sepsis with Intra-abdominal Source Multiple Colonic Fistulas colovesical fistula  Acute Cystitis/left severe hydronephrosis --HR 110, WBC 22.2;  --CT Abdomen Pelvis shows enterocolonic and colocolonic fistulas with associated inflammatory changes, No fluid collection/abscess, no free air. 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 -- urine culture-- >100K  gram-negative rods -- blood culture negative - Gastroenterology consulted for multiple colonic fistulas concerning for inflammatory bowel process, appreciate their consultation and recommendations-- G.I. signed off. - Urology consulted for left hydroureteronephrosis --10/2 -- s/p left nephrostomy drain placed with interventional radiology --10/4 s/p Hartman's procedure with  takedown of colovesical fistula and coloenteric fistula.  --cont Zosyn --cont Full liquid diet, advance per GenSurg --cont Foley given colovesical fistula  --seen by PT/OT--rehab however pt wants to go home -- discussed with patient regarding discharge planning to rehab given significant weakness, colostomy management, left kidney drain, JP drain. Patient did voice understanding and agreeable now. Social worker informed. -- Okay to discontinue Foley catheter per Dr. Elenor Legato. Leave JP drain in for now. Colostomy education done by wound care. -- Completed seven days of IV antibiotic with Zosyn. General surgery recommends five more days with PO antibiotic. Augmentin started.   Acute Kidney Injury due to external Obstructive Uropathy --Likely post renal; moderate to severe (L) hydroureteronephrosis related to compression by inflammatory changes in bowel seen on CT Abdomen/Pelvis --d/c MIVF, encourage oral hydration -- urology Dr. Lonna Cobb patient will discharge with left nephrostomy drain-- she will follow-up outpatient with nephrostogram down the road.   Normocytic Anemia --stable   Thrombocytosis --resolved   Hypothyroidism  Hx of Thyroid Cancer s/p Partial Thyroidectomy -Continue home Levothyroxine   Essential Hypertension - Not on any outpatient medication   GERD - Protonix   H/o RA --on methotrexate and folic acid--resumed at d/c --follows with Dr Allena Katz at [facility]    Hypokalemia Hypomag --monitor and supplement PRN     [facility] General surgery and urology is okay with discharge plan.   Family communication : brother and sister-in-law on the phone Consults : G.I., general surgery, urology CODE STATUS: full DVT Prophylaxis :Lovenox     Pain control - [place] Controlled Substance Reporting System database was reviewed. and patient was instructed, not to drive, operate heavy machinery, perform activities at heights, swimming or  participation in water activities or provide baby-sitting services while on Pain,  Sleep and Anxiety Medications; until their outpatient Physician has advised to do so again. Also recommended to not to take more than prescribed Pain, Sleep and Anxiety Medications.   Disposition: Rehabilitation facility Diet recommendation:  Discharge Diet Orders (From admission, onward)     Start     Ordered   10/17/22 0000  Diet - low sodium heart healthy        10/17/22 1044   10/16/22 0000  Diet - low sodium heart healthy        10/16/22 1029           Cardiac diet DISCHARGE MEDICATION: Allergies as of 10/17/2022       Reactions   Indomethacin Other (See Comments), Palpitations        Medication List     STOP taking these medications    aspirin-acetaminophen-caffeine 250-250-65 MG tablet Commonly known as: EXCEDRIN MIGRAINE   CALCIUM CITRATE PO       TAKE these medications    acetaminophen 500 MG tablet Commonly known as: TYLENOL Take 2 tablets (1,000 mg total) by mouth every 6 (six) hours.   amoxicillin-clavulanate 875-125 MG tablet Commonly known as: AUGMENTIN Take 1 tablet by mouth every 12 (twelve) hours for 4 days.   APPLE CIDER VINEGAR PO Take 1 tablet by mouth daily. 1 gummie daily   ascorbic acid 1000 MG tablet Commonly known as: VITAMIN C Take 1,000 mg by mouth daily.   Calcium Carbonate-Vitamin D 600-200 MG-UNIT Caps Take 1,200 mg by mouth daily.   folic acid 1 MG tablet Commonly known as: FOLVITE Take 1 tablet (1 mg total) by mouth daily.   levothyroxine 88 MCG tablet Commonly known as: SYNTHROID Take 88 mcg by mouth daily before breakfast. Takes only 1/2 tablet every Sunday   methotrexate 2.5 MG tablet Commonly known as: RHEUMATREX Take 7.5 mg by mouth once a week. 7.5 mg once a week on Saturdays   Multi-Vitamin tablet Take 1 tablet by mouth daily. Centrum   oxyCODONE 5 MG immediate release tablet Commonly known as: Oxy IR/ROXICODONE Take 1  tablet (5 mg total) by mouth every 6 (six) hours as needed for moderate pain or severe pain.   pantoprazole 40 MG tablet Commonly known as: PROTONIX Take 40 mg by mouth daily.   TURMERIC PO Take 1,000 mg by mouth daily.               Discharge Care Instructions  (From admission, onward)           Start     Ordered   10/17/22 0000  Discharge wound care:       Comments: Colostomy per [facility]   10/17/22 1044   10/16/22 0000  Discharge wound care:       Comments: Daily dry dressing to midline wound   10/16/22 1029            Contact information for follow-up providers     Leafy Ro, MD Follow up on 10/25/2022.   Specialty: General Surgery Why: Go at 4:00pm. Contact information: [address] [place] [address] [phone]         Danella Penton, MD. Schedule an appointment as soon as possible for a visit in 1 week(s).   Specialty: Internal Medicine Why: hospital f/u Contact information: [address] [facility] [place] [address] [phone]         [name], MD Follow up on 11/16/2022.   Specialty: Urology Why: at  8;45 Contact information: [address] [place] [address] [phone]              Contact information for after-discharge care     Destination     [facility] Preferred SNF .   Service: Skilled Nursing Contact information: [address] [place] [address] [phone]                    Discharge Exam: Ceasar Mons Weights   10/10/22 0933 10/13/22 0811  Weight: 53.5 kg 53 kg   GENERAL:  89 y.o.-year-old patient with no acute distress.  LUNGS: Normal breath sounds bilaterally, no wheezing CARDIOVASCULAR: S1, S2 normal. No murmur   ABDOMEN: left nephrostomy drain, JP drain,  midline incision, colostomy EXTREMITIES: No  edema b/l.    NEUROLOGIC: nonfocal  patient is alert and  awake  Condition at discharge: fair  The results of significant diagnostics from this hospitalization (including imaging, microbiology, ancillary and laboratory) are listed below for reference.   Imaging Studies: IR NEPHROSTOMY PLACEMENT LEFT  Result Date: 10/11/2022 INDICATION: Hydronephrosis in the setting of inflammatory bowel process and distal ureteral obstruction EXAM: Placement of percutaneous nephrostomy tube using ultrasound and fluoroscopic guidance COMPARISON:  None Available. MEDICATIONS: Documented in the EMR ANESTHESIA/SEDATION: Moderate (conscious) sedation was employed during this procedure. A total of Versed 0.5 mg and Fentanyl 25 mcg was administered intravenously. Moderate Sedation Time: 30 minutes. The patient's level of consciousness and vital signs were monitored continuously by radiology nursing throughout the procedure under my direct supervision. CONTRAST:  30 mL-administered into the collecting system(s) FLUOROSCOPY TIME:  Fluoroscopy Time: 5.8 minutes (21 mGy) COMPLICATIONS: None immediate. PROCEDURE: Informed written consent was obtained from the patient after a thorough discussion of the procedural risks, benefits and alternatives. All questions were addressed. Maximal Sterile Barrier Technique was utilized including caps, mask, sterile gowns, sterile gloves, sterile drape, hand hygiene and skin antiseptic. A timeout was performed prior to the initiation of the procedure. The patient was placed prone on the exam table. The left flank was prepped and draped in the standard sterile fashion. Ultrasound was used to evaluate the left kidney, which demonstrated hydronephrosis. Skin entry site was marked, and local analgesia was obtained with 1% lidocaine. Using ultrasound guidance, an upper pole calyx was initially selected due to positioning of the ribs and high position of the splenic flexure of the colon based on recent CT and visualization on ultrasound. Puncture was obtained  using a 21-gauge Chiba needle. Entry into the collecting system was confirmed with return of urine, and gentle injection of contrast material under fluoroscopy opacifying the proximal collecting system. An 018 wire was advanced through the needle into the renal pelvis and proximal ureter, followed by placement of a transition dilator. An antegrade nephrostogram was then performed, which at this point demonstrated extra ureteral contrast material. The transitional dilator was withdrawn over wire in attempt to opacify the proximal collecting system, which was unsuccessful. The initial access was removed, and the decision was made to proceed with a second attempt at access. Again using ultrasound guidance, this time a lower pole calyx was identified as safe for percutaneous access. While maintaining visualization of the splenic flexure as best possible on ultrasound, access was obtained into the proximal collecting system using a 21 gauge Chiba needle. Entry into the collecting system was again confirmed with return of urine and gentle injection of contrast material opacifying the proximal collecting system. An 018 wire was  advanced through the needle into the renal pelvis and proximal ureter, followed by placement of a transition dilator. An antegrade nephrostogram was then performed, this time demonstrating appropriate location in the proximal collecting system. Over an 035 Amplatz wire, the percutaneous tract was serially dilated, and a 10.2 French nephrostomy tube was advanced with the loop formed in the renal pelvis. Appropriate positioning of the nephrostomy tube was confirmed with injection of contrast material. The nephrostomy tube was secured to skin using silk suture and a dressing. It was attached to bag drainage. The patient tolerated the procedure well without immediate complication. IMPRESSION: Successful placement of a left-sided 10 French percutaneous nephrostomy tube. Nephrostomy tube placed to bag  drainage. Electronically Signed   By: Olive Bass M.D.   On: 10/11/2022 16:19   CT ABDOMEN PELVIS W WO CONTRAST  Result Date: 10/09/2022 CLINICAL DATA:  Abdominal pain, weight loss, C diff colitis status post vancomycin EXAM: CT ABDOMEN AND PELVIS WITHOUT AND WITH CONTRAST TECHNIQUE: Multidetector CT imaging of the abdomen and pelvis was performed following the standard protocol before and following the bolus administration of intravenous contrast. RADIATION DOSE REDUCTION: This exam was performed according to the departmental dose-optimization program which includes automated exposure control, adjustment of the mA and/or kV according to patient size and/or use of iterative reconstruction technique. CONTRAST:  65mL OMNIPAQUE IOHEXOL 300 MG/ML  SOLN COMPARISON:  None Available. FINDINGS: Lower chest: Lung bases are clear. Hepatobiliary: Liver is within normal limits. Layering tiny gallstones (series 7/image 80), without associated inflammatory changes. No intrahepatic or extrahepatic ductal dilatation. Pancreas: Within normal limits. Spleen: Within normal limits. Adrenals/Urinary Tract: Adrenal glands are within normal limits. Subcentimeter posterior right upper pole renal cyst (series 2/image 17), benign (Bosniak I). Additional dominant 3.1 cm left lower pole renal cyst (series 12/image 30), benign (Bosniak I). No follow-up is recommended. No renal, ureteral, or bladder calculi. Moderate to severe left hydroureteronephrosis. Narrowing/extrinsic compression of the mid left ureter or (series 7/image 4), likely related to inflammatory changes (described below). Thick-walled bladder with mucosal hyperenhancement and nondependent gas (series 7, image 67), likely reflecting cystitis, favored to be secondary to a colovesical fistula (series 11/image 49). Stomach/Bowel: Stomach is notable for a moderate hiatal hernia. No evidence of bowel obstruction. Appendix is not discretely visualized. Wall thickening involving  mid/distal ileum in the central lower abdomen (series 7/image 2), with adjacent enterocolonic (series 7/image 5) and colocolonic fistula (series 7/image 61). Adjacent wall thickening involving the rectosigmoid colon (series 7/image 60). No drainable fluid collection/abscess. No free air. Vascular/Lymphatic: No evidence of abdominal aortic aneurysm. Atherosclerotic calcifications of the abdominal aorta and branch vessels, although vessels remain patent. No suspicious abdominopelvic lymphadenopathy. Reproductive: Status post hysterectomy. Right ovary is within normal limits. Left ovary is notable for a dominant 2.8 cm cyst/follicle (series 7, image 88). Other: No abdominopelvic ascites. Musculoskeletal: Degenerative changes of the visualized thoracolumbar spine. IMPRESSION: Enterocolonic and colocolonic fistulas, with associated inflammatory changes involving the rectosigmoid colon and an adjacent loop of distal small bowel. No drainable fluid collection/abscess. No pneumatosis or free air. 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 Moderate to severe left hydroureteronephrosis, likely related to extrinsic compression by the inflammatory changes noted above. Additional ancillary findings as above. These results will be called to the ordering clinician or representative by the Radiologist Assistant, and communication documented in the PACS or [employer]. Electronically Signed   By: Charline Bills M.D.   On: 10/09/2022 20:13    Microbiology: Results for orders placed or performed during the hospital  encounter of 10/10/22  Urine Culture     Status: Abnormal   Collection Time: 10/10/22  9:34 AM   Specimen: Urine, Clean Catch  Result Value Ref Range Status   Specimen Description   Final    URINE, CLEAN CATCH Performed at [facility], [address] [place], [place] [address]    Special Requests   Final    NONE Performed at [facility], [address] [place], [place]  [address]    Culture >=100,000 COLONIES/mL KLEBSIELLA PNEUMONIAE (A)  Final   Report Status 10/12/2022 FINAL  Final   Organism ID, Bacteria KLEBSIELLA PNEUMONIAE (A)  Final      Susceptibility   Klebsiella pneumoniae - MIC*    AMPICILLIN >=32 RESISTANT Resistant     CEFAZOLIN <=4 SENSITIVE Sensitive     CEFEPIME <=0.12 SENSITIVE Sensitive     CEFTRIAXONE <=0.25 SENSITIVE Sensitive     CIPROFLOXACIN <=0.25 SENSITIVE Sensitive     GENTAMICIN 2 SENSITIVE Sensitive     IMIPENEM 0.5 SENSITIVE Sensitive     NITROFURANTOIN <=16 SENSITIVE Sensitive     TRIMETH/SULFA <=20 SENSITIVE Sensitive     AMPICILLIN/SULBACTAM 8 SENSITIVE Sensitive     PIP/TAZO <=4 SENSITIVE Sensitive     * >=100,000 COLONIES/mL KLEBSIELLA PNEUMONIAE  Blood culture (routine x 2)     Status: None   Collection Time: 10/10/22 10:45 AM   Specimen: BLOOD  Result Value Ref Range Status   Specimen Description BLOOD LEFT UPPER ARM  Final   Special Requests   Final    BOTTLES DRAWN AEROBIC AND ANAEROBIC Blood Culture adequate volume   Culture   Final    NO GROWTH 5 DAYS Performed at [facility], [address] [place], [place] [address]    Report Status 10/15/2022 FINAL  Final  Blood culture (routine x 2)     Status: None   Collection Time: 10/10/22 10:50 AM   Specimen: BLOOD  Result Value Ref Range Status   Specimen Description BLOOD RIGHT ANTECUBITAL  Final   Special Requests   Final    BOTTLES DRAWN AEROBIC AND ANAEROBIC Blood Culture results may not be optimal due to an inadequate volume of blood received in culture bottles   Culture   Final    NO GROWTH 5 DAYS Performed at [facility], [address] [place], [place] [address]    Report Status 10/15/2022 FINAL  Final    Labs: CBC: Recent Labs  Lab 10/11/22 0511 10/12/22 0320 10/14/22 0420 10/15/22 0343 10/16/22 0430  WBC 12.2* 14.9* 15.4* 12.0* 9.9  HGB 10.0* 10.3* 8.3* 8.2* 8.4*  HCT 30.7* 32.3* 26.3* 25.3* 26.2*  MCV 86.0 89.2  89.5 86.9 88.5  PLT 377 388 333 357 348   Basic Metabolic Panel: Recent Labs  Lab 10/11/22 0511 10/12/22 0320 10/14/22 0420 10/15/22 0343  NA 138 137 137 138  K 3.1* 3.7 3.5 3.7  CL 106 106 106 106  CO2 21* 21* 20* 23  GLUCOSE 87 102* 108* 98  BUN 24* 19 14 13   CREATININE 1.08* 0.99 0.99 0.86  CALCIUM 7.9* 7.9* 7.5* 7.4*  MG  --  2.0 1.6* 2.0  PHOS  --   --   --  2.2*    Discharge time spent: greater than 30 minutes.  Signed: Enedina Finner, MD [facility] 10/17/2022

## 2022-10-25 ENCOUNTER — Ambulatory Visit: Payer: Medicare Other | Admitting: Physician Assistant

## 2022-10-25 ENCOUNTER — Encounter: Payer: Self-pay | Admitting: Physician Assistant

## 2022-10-25 VITALS — BP 117/67 | HR 101 | Temp 98.0°F | Ht 62.0 in | Wt 107.8 lb

## 2022-10-25 DIAGNOSIS — Z09 Encounter for follow-up examination after completed treatment for conditions other than malignant neoplasm: Secondary | ICD-10-CM

## 2022-10-25 DIAGNOSIS — K5732 Diverticulitis of large intestine without perforation or abscess without bleeding: Secondary | ICD-10-CM

## 2022-10-25 DIAGNOSIS — K632 Fistula of intestine: Secondary | ICD-10-CM

## 2022-10-25 NOTE — Patient Instructions (Addendum)
We have removed your staples. We have placed steri strips. These will fall off in about 1-2 weeks. You may shower with these, just do not scrub at them.   We removed your drain. Just keep a Band-Aid on this for 2 days.   You may eat what ever you like. Do concentrate on eating more Proteins to aid in healing.      Follow up with Dr Everlene Farrier in 1 month.   Please call and ask to speak with a nurse if you develop questions or concerns.

## 2022-10-25 NOTE — Progress Notes (Unsigned)
[facility] POST-OP OFFICE VISIT  10/25/2022  HPI: [name] is a 89 y.o. female 12 days s/p exploratory laparotomy, sigmoid colectomy with end colostomy, repair of small bowel fistula, repair of incarcerated umbilical hernia with Dr Everlene Farrier   She is doing very well given the circumstances No significant pain No fever, chills, nausea, emesis Colostomy working without issue JP serous with minimal ouput Left PCN in place - uology follow up 0n 11/07 Working with PT/OT at SNF  Vital signs: BP 117/67   Pulse (!) 101   Temp 98 F (36.7 C)   Ht 5\' 2"  (1.575 m)   Wt 107 lb 12.8 oz (48.9 kg)   SpO2 97%   BMI 19.72 kg/m    Physical Exam: Constitutional: Well appearing female, NAD Abdomen: Soft, non-tender, non-distended, no rebound/guarding. JP in left abdomen; output serous (removed). Colostomy in mid-left abdomen, pink, well budded, good output. Appliance changed Skin: Laparotomy is healing well, no erythema or drainage. Staples removed   Assessment/Plan: This is a 89 y.o. female 12 days s/p exploratory laparotomy, sigmoid colectomy with end colostomy, repair of small bowel fistula, repair of incarcerated umbilical hernia with Dr Everlene Farrier    - Staples removed; steri-strips placed  - Drain removed; bandage placed; reviewed care  - Colostomy appliance changed without issue   - Pain control prn  - Reviewed wound care recommendation  - Reviewed lifting restrictions; 6 weeks total - Continue PT at SNF  - Reviewed surgical pathology; Perforated diverticulitis   - We will see her again in ~1 month; She understands to call with questions/concerns in the interim   Brother and sister-in-law at bedside. All questions answered.   -- Lynden Oxford, PA-C  [facility] 10/25/2022, 3:58 PM M-F: 7am - 4pm

## 2022-11-16 ENCOUNTER — Ambulatory Visit: Payer: Self-pay | Admitting: Urology

## 2022-11-17 ENCOUNTER — Ambulatory Visit (INDEPENDENT_AMBULATORY_CARE_PROVIDER_SITE_OTHER): Payer: Medicare Other | Admitting: Urology

## 2022-11-17 ENCOUNTER — Encounter: Payer: Self-pay | Admitting: Urology

## 2022-11-17 VITALS — BP 110/70 | HR 65 | Ht 62.0 in | Wt 106.0 lb

## 2022-11-17 DIAGNOSIS — N1339 Other hydronephrosis: Secondary | ICD-10-CM | POA: Diagnosis not present

## 2022-11-17 NOTE — Progress Notes (Signed)
I, [name], acting as a scribe for Monica Altes, MD., have documented all relevant documentation on the behalf of Monica Altes, MD, as directed by Monica Altes, MD while in the presence of Monica Altes, MD.  11/17/2022 5:33 PM   April Holding 07-08-1933 [id number]  Referring provider: Danella Penton, MD [phone] [facility] [address] [facility] [place],  [place] [address]  Chief Complaint  Patient presents with   Other    HPI: [name] is a 89 y.o. female presents in follow-up of recent hospitalization.  Admitted 10/10/22 was sepsis secondary to multiple colonic fistulas, colovesical fistula, and diverticulitis.  Admission CT showed severe left hydronephrosis felt secondary to extrinsic obstruction from extrinsic compression of the mid ureter. She was treated with placement of a percutaneous nephrostomy tube.  She subsequently underwent sigmoid colectomy within colostomy and repair of small bowel fistula from diverticulitis. She currently has no complaints.    PMH: Past Medical History:  Diagnosis Date   Arthritis    Asthma    as a child   Cancer (HCC)    GERD (gastroesophageal reflux disease)    Hypothyroidism    Pneumonia 1971   Thyroid nodule     Surgical History: Past Surgical History:  Procedure Laterality Date   ABDOMINAL HYSTERECTOMY     CATARACT EXTRACTION, BILATERAL     COLECTOMY WITH COLOSTOMY CREATION/HARTMANN PROCEDURE N/A 10/13/2022   Procedure: COLECTOMY WITH COLOSTOMY CREATION/HARTMANN PROCEDURE With REPAIR OF BOWEL;  Surgeon: Leafy Ro, MD;  Location: [facility] ORS;  Service: General;  Laterality: N/A;   IR NEPHROSTOMY PLACEMENT LEFT  10/11/2022   KNEE ARTHROPLASTY Left 03/24/2022   Procedure: COMPUTER ASSISTED TOTAL KNEE ARTHROPLASTY;  Surgeon: Donato Heinz, MD;  Location: [facility] ORS;  Service: Orthopedics;  Laterality: Left;   thyroid gland removed  1977   THYROIDECTOMY, PARTIAL Left     Home Medications:   Allergies as of 11/17/2022       Reactions   Indomethacin Other (See Comments), Palpitations        Medication List        Accurate as of November 17, 2022  5:33 PM. If you have any questions, ask your nurse or doctor.          acetaminophen 500 MG tablet Commonly known as: TYLENOL Take 2 tablets (1,000 mg total) by mouth every 6 (six) hours.   APPLE CIDER VINEGAR PO Take 1 tablet by mouth daily. 1 gummie daily   ascorbic acid 1000 MG tablet Commonly known as: VITAMIN C Take 1,000 mg by mouth daily.   Calcium Carbonate-Vitamin D 600-200 MG-UNIT Caps Take 1,200 mg by mouth daily.   folic acid 1 MG tablet Commonly known as: FOLVITE Take 1 tablet (1 mg total) by mouth daily.   levothyroxine 88 MCG tablet Commonly known as: SYNTHROID Take 88 mcg by mouth daily before breakfast. Takes only 1/2 tablet every Sunday   methotrexate 2.5 MG tablet Commonly known as: RHEUMATREX Take 7.5 mg by mouth once a week. 7.5 mg once a week on Saturdays   Multi-Vitamin tablet Take 1 tablet by mouth daily. Centrum   oxyCODONE 5 MG immediate release tablet Commonly known as: Oxy IR/ROXICODONE Take 1 tablet (5 mg total) by mouth every 6 (six) hours as needed for moderate pain or severe pain.   pantoprazole 40 MG tablet Commonly known as: PROTONIX Take 40 mg by mouth daily.   TURMERIC PO Take 1,000 mg by  mouth daily.        Allergies:  Allergies  Allergen Reactions   Indomethacin Other (See Comments) and Palpitations    Social History:  reports that she quit smoking about 20 years ago. Her smoking use included cigarettes. She started smoking about 62 years ago. She has a 42 pack-year smoking history. She has never used smokeless tobacco. She reports that she does not drink alcohol and does not use drugs.   Physical Exam: BP 110/70   Pulse 65   Ht 5\' 2"  (1.575 m)   Wt 106 lb (48.1 kg)   BMI 19.39 kg/m   Constitutional:  Alert and oriented, No acute distress. HEENT:  [place] AT, moist mucus membranes.  Trachea midline, no masses. Cardiovascular: No clubbing, cyanosis, or edema. Respiratory: Normal respiratory effort, no increased work of breathing. GI: Abdomen is soft, nontender, nondistended, no abdominal masses Skin: No rashes, bruises or suspicious lesions. Neurologic: Grossly intact, no focal deficits, moving all 4 extremities. Psychiatric: Normal mood and affect.   Pertinent imaging: CT was personally reviewed and interpreted.   CT  EXAM: CT ABDOMEN AND PELVIS WITHOUT AND WITH CONTRAST   TECHNIQUE: Multidetector CT imaging of the abdomen and pelvis was performed following the standard protocol before and following the bolus administration of intravenous contrast.   RADIATION DOSE REDUCTION: This exam was performed according to the departmental dose-optimization program which includes automated exposure control, adjustment of the mA and/or kV according to patient size and/or use of iterative reconstruction technique.   CONTRAST:  65mL OMNIPAQUE IOHEXOL 300 MG/ML  SOLN   COMPARISON:  None Available.   FINDINGS: Lower chest: Lung bases are clear.   Hepatobiliary: Liver is within normal limits.   Layering tiny gallstones (series 7/image 80), without associated inflammatory changes. No intrahepatic or extrahepatic ductal dilatation.   Pancreas: Within normal limits.   Spleen: Within normal limits.   Adrenals/Urinary Tract: Adrenal glands are within normal limits.   Subcentimeter posterior right upper pole renal cyst (series 2/image 17), benign (Bosniak I). Additional dominant 3.1 cm left lower pole renal cyst (series 12/image 30), benign (Bosniak I). No follow-up is recommended.   No renal, ureteral, or bladder calculi.   Moderate to severe left hydroureteronephrosis. Narrowing/extrinsic compression of the mid left ureter or (series 7/image 4), likely related to inflammatory changes (described below).   Thick-walled bladder with  mucosal hyperenhancement and nondependent gas (series 7, image 67), likely reflecting cystitis, favored to be secondary to a colovesical fistula (series 11/image 49).   Stomach/Bowel: Stomach is notable for a moderate hiatal hernia.   No evidence of bowel obstruction.   Appendix is not discretely visualized.   Wall thickening involving mid/distal ileum in the central lower abdomen (series 7/image 2), with adjacent enterocolonic (series 7/image 5) and colocolonic fistula (series 7/image 61).   Adjacent wall thickening involving the rectosigmoid colon (series 7/image 60).   No drainable fluid collection/abscess.   No free air.   Vascular/Lymphatic: No evidence of abdominal aortic aneurysm.   Atherosclerotic calcifications of the abdominal aorta and branch vessels, although vessels remain patent.   No suspicious abdominopelvic lymphadenopathy.   Reproductive: Status post hysterectomy.   Right ovary is within normal limits. Left ovary is notable for a dominant 2.8 cm cyst/follicle (series 7, image 88).   Other: No abdominopelvic ascites.   Musculoskeletal: Degenerative changes of the visualized thoracolumbar spine.   IMPRESSION: Enterocolonic and colocolonic fistulas, with associated inflammatory changes involving the rectosigmoid colon and an adjacent loop of distal small bowel.  No drainable fluid collection/abscess. No pneumatosis or free air.   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   Moderate to severe left hydroureteronephrosis, likely related to extrinsic compression by the inflammatory changes noted above.   Additional ancillary findings as above.   These results will be called to the ordering clinician or representative by the Radiologist Assistant, and communication documented in the PACS or [employer].     Electronically Signed   By: Charline Bills M.D.   On: 10/09/2022 20:13  Assessment & Plan:    1. Left hydronephrosis Secondary to  extrinsic compression of diverticulitis. Secondary severe diverticulitis.  Status post sigmoid colectomy within colostomy.  Will schedule nephrostogram. If no evidence of ureteral fistula and resolution of hydronephrosis, we'll have radiology remove nephrostomy too.  [facility] [address] [place], [place] [address] [phone]

## 2022-11-19 ENCOUNTER — Encounter: Payer: Self-pay | Admitting: Urology

## 2022-11-21 ENCOUNTER — Other Ambulatory Visit: Payer: Self-pay

## 2022-11-21 ENCOUNTER — Encounter: Payer: Self-pay | Admitting: Urology

## 2022-11-21 DIAGNOSIS — N1339 Other hydronephrosis: Secondary | ICD-10-CM

## 2022-11-21 NOTE — Progress Notes (Signed)
Talked with IR, will get patient scheduled for Nephrostogram.

## 2022-11-22 ENCOUNTER — Encounter: Payer: Self-pay | Admitting: Surgery

## 2022-11-22 ENCOUNTER — Ambulatory Visit: Payer: Medicare Other | Admitting: Physician Assistant

## 2022-11-22 VITALS — BP 130/71 | HR 90 | Temp 97.9°F | Ht 62.0 in | Wt 106.4 lb

## 2022-11-22 DIAGNOSIS — K632 Fistula of intestine: Secondary | ICD-10-CM

## 2022-11-22 DIAGNOSIS — Z09 Encounter for follow-up examination after completed treatment for conditions other than malignant neoplasm: Secondary | ICD-10-CM

## 2022-11-22 DIAGNOSIS — K5732 Diverticulitis of large intestine without perforation or abscess without bleeding: Secondary | ICD-10-CM

## 2022-11-22 DIAGNOSIS — Z433 Encounter for attention to colostomy: Secondary | ICD-10-CM

## 2022-11-22 NOTE — Progress Notes (Signed)
Patient for IR Nephrostogram on Thurs 11/23/2022, I called and spoke with the patient on the phone and gave pre-procedure instructions. Pt was made aware to be here at 3p and check in at the new entrance. Pt stated understanding.  Called 11/22/2022

## 2022-11-22 NOTE — Progress Notes (Signed)
[facility] POST-OP OFFICE VISIT  11/22/2022  HPI: [name] is a 89 y.o. female ~6 weeks s/p exploratory laparotomy, sigmoid colectomy with end colostomy, repair of small bowel fistula, repair of incarcerated umbilical hernia with Dr Everlene Farrier   She continues to do exceptionally well given the circumstances Biggest issue is leaking from underneath the appliance, she has transitioned to one-piece  She does have home health; they have given hier supplies No abdominal pain, nausea, emesis, fever, chills She is tolerating PO; still with decreased appetite Ostomy is functioning  She is back home  Vital signs: BP 130/71 (BP Location: Right Arm, Patient Position: Sitting, Cuff Size: Small)   Pulse 90   Temp 97.9 F (36.6 C) (Oral)   Ht 5\' 2"  (1.575 m)   Wt 106 lb 6.4 oz (48.3 kg)   SpO2 97%   BMI 19.46 kg/m    Physical Exam: Constitutional: Well appearing female, NAD Abdomen: Soft, non-tender, non-distended, no rebound/guarding. Colostomy in left abdomen, pink, gas in bag.  Skin: Laparotomy is well healed   Assessment/Plan: This is a 89 y.o. female ~6 weeks s/p exploratory laparotomy, sigmoid colectomy with end colostomy, repair of small bowel fistula, repair of incarcerated umbilical hernia with Dr Everlene Farrier    - I suspect the one piece is failing to have a good seal in the bottom right corner. May need to return back to a two piece appliance. I did offer stoma powder as well but she reports she has been given a lot of supplies from [facility] and has this. I will refer her to the ostomy clinic to be a resource outpatient  - Nothing further from surgery perspective currently; she is doing well  - Plan for IR evaluation for nephrostomy tube removal; pending scheduling   - We will see her again in ~3 months; She understands to call with questions/concerns in the interim   -- Monica Oxford, PA-C  [facility] 11/22/2022, 2:53 PM M-F: 7am - 4pm

## 2022-11-22 NOTE — Patient Instructions (Addendum)
If you have any concerns or questions, please feel free to call our office.   Colostomy Home Guide, Adult  A colostomy is a surgically created opening (ostomy) that brings a piece of the large intestine through an opening in the abdomen to create a stoma. The stoma allows stool (feces) and gas (flatus) to leave the body. A stoma may be a variety of shapes and sizes. An ostomy pouch or bag is used to cover the stoma and to contain the stool and gas. A colostomy may be temporary or permanent, depending on the medical reason for your surgery. After surgery, you will need to learn to care for your ostomy. This includes emptying and changing the colostomy bag as needed. There are many different types of bags or pouching options, including one-piece, two-piece, clear, fabric covered, flat or curved (convex), drainable, and closed or vented options. Your health care provider will help you select the best pouch for you. How to care for the stoma Your stoma should look pink, red, and moist, like the inside of your cheek. It should not be painful to touch, but it may bleed easily when rubbed or bumped. Soon after surgery, the stoma may be swollen, but this goes away within 6 weeks. To care for the stoma: Keep the skin around the stoma clean and dry. Use a clean, soft washcloth to gently wash the stoma and the skin around it. Clean using a circular motion, and wipe away from the stoma opening, nottoward it. Use warm water and only use cleansers, such as mild soap and stoma-safe adhesive remover, recommended by your health care provider. Inspect and dry the skin around the stoma. Use stoma powder or skin protectant on your skin as told by your health care provider. Do not use any other powders, gels, wipes, or creams on the skin around the stoma. These may keep pouch adhesive from sticking or may cause injury to your stoma. Check the stoma area every day for signs of infection or problems. Check for: New or  worsening redness, warmth, swelling, irritation, itching, or pain around the stoma. New or worsening changes to the stoma, such as bleeding, trauma, or a dark and dusky color. Changes to the drainage from the stoma, such as pus, blood, a large amount of liquid drainage, or little to no stool drainage. Measure the stoma opening regularly and record the size. Watch for changes, such as the stoma getting longer, becoming flat, or falling below skin level. (It is normal for the stoma to get smaller as the swelling goes away.) Share this information with your health care provider. Changes in the size or shape of the stoma may require a different style of pouch to be used. How to empty the colostomy bag     Empty your bag at bedtime, before physical activity or sexual relations, and whenever it is one-third to one-half full. Do not let the bag get more than half-full with stool or gas. The bag could leak if it gets too full. Some colostomy bags have a built-in gas release valve that releases gas often throughout the day. Follow these basic steps for emptying a drainable pouch: Prepare the toilet or container: If draining directly into the toilet, put several pieces of toilet paper into the toilet water. This will prevent splashing as you empty the stool into the toilet. Sit far back on the toilet seat. If draining into a container, place a few pieces of moistened toilet paper in the bottom of the  container. This can help when emptying the container. Remove the clip or the hook-and-loop fastener from the tail end of the bag. Unroll the tail, then empty the stool into the toilet or container. Clean the tail with toilet paper or a moist towelette. Reroll the tail, and close it with the clip or the hook-and-loop fastener. Wash your hands. How to change the colostomy bag Change your bag every 3-4 days or as often as told by your health care provider. Also change the bag if it is leaking or separating from  the skin, or if your skin around the stoma looks or feels irritated. Irritated skin may be a sign that the bag is leaking. Always have colostomy supplies with you, and follow these basic steps: Have paper towels or tissues and a trash bag nearby to clean any discharge and dispose of the soiled pouch. Remove the old pouching appliance. Use your fingers, a warm, moist cloth, or a stoma-safe adhesive remover to gently remove the pouch and remove adhesive residue. Clean the stoma area with water or with mild soap and water, as directed. Use water to rinse away any soap. Dry the skin. You may use the cool setting on a hair dryer to do this. Use a tracing pattern (template) to cut the skin barrier to the size needed. The opening should be large enough to fit around the stoma, but you should not see exposed skin. If you are using a two-piece bag, attach the bag and the base to each other. Add the barrier ring, if you use one. If directed, apply stoma powder or skin protectant to the skin. Warm the pouch base with your hands or blow with a hair dryer for 5-10 seconds. Remove the paper from the adhesive backing of the pouch base. Press the adhesive backing onto the skin around the stoma. Gently rub the pouch base onto the skin. This creates heat that helps the barrier to stick. Apply barrier strips to the edges of the pouch, if desired. Dispose of the soiled pouch, and wash your hands. General recommendations Avoid wearing tight clothes or having anything press directly on your stoma or bag. Change your clothing whenever it is soiled or damp. You may shower or bathe with the bag on or off. Do not use harsh or oily soaps or lotions. Dry the skin and bag after bathing. Do not shower or bathe right after changing the pouch. Wait 4-5 hours. Store all supplies in a cool, dry place. Do not leave supplies in extreme heat because some parts can melt or not stick as well. Whenever you leave home, take extra  clothing and an extra ostomy pouch with you. If your bag gets wet, you can dry it with a hair dryer on the cool setting. To prevent odor, you may put drops of ostomy deodorizer in the bag. If recommended by your health care provider, put ostomy lubricant inside the bag. This helps stool slide out of the bag more easily and completely. Contact a health care provider if: You have new or worsening redness, warmth, swelling, irritation, itching, or pain around the stoma. You have new or worsening changes to the stoma, such as bleeding, trauma, or a dark and dusky color. There are changes to the drainage from the stoma, such as pus, blood, a large amount of liquid drainage, or little to no stool drainage. Your stoma extends in or out farther than normal. You need to change your bag every day or have frequent leaks. You  have a fever. Get help right away if: Your stool is bloody. You have nausea or you vomit. You have trouble breathing. These symptoms may be an emergency. Get help right away. Call 911. Do not wait to see if the symptoms will go away. Do not drive yourself to the hospital. Summary Measure your stoma opening regularly and record the size. Watch for changes. Empty your bag at bedtime, before physical activity or sexual relations, and whenever it is one-third to one-half full. Do not let the bag get more than half-full with stool or gas. Change your bag every 3-4 days or as often as told by your health care provider. Whenever you leave home, take extra clothing and an extra ostomy pouch with you. This information is not intended to replace advice given to you by your health care provider. Make sure you discuss any questions you have with your health care provider. Document Revised: 01/05/2021 Document Reviewed: 01/05/2021 Elsevier Patient Education  2024 [employer].

## 2022-11-23 ENCOUNTER — Ambulatory Visit
Admission: RE | Admit: 2022-11-23 | Discharge: 2022-11-23 | Disposition: A | Discharge status: Home / Self Care | Payer: Medicare Other | Source: Ambulatory Visit | Attending: Urology | Admitting: Urology

## 2022-11-23 DIAGNOSIS — N1339 Other hydronephrosis: Secondary | ICD-10-CM | POA: Diagnosis present

## 2022-11-23 HISTORY — PX: IR NEPHROSTOGRAM LEFT THRU EXISTING ACCESS: IMG6061

## 2022-11-23 MED ORDER — IOHEXOL 300 MG/ML  SOLN
10.0000 mL | Freq: Once | INTRAMUSCULAR | Status: AC | PRN
  Administered 2022-11-23: 10 mL

## 2022-11-23 NOTE — Procedures (Signed)
Interventional Radiology Procedure Note  Procedure:   Antegrade left nephrostogram via PCN.   Removal  Findings: Patent ureter.  Antegrade flow.  No hydro.   Recommendations: - Site to heal with secondary intention. - OK to shower - routine dry dressing until the site heals    Signed,  Yvone Neu. Loreta Ave, DO

## 2022-12-19 ENCOUNTER — Ambulatory Visit (HOSPITAL_COMMUNITY)
Admission: RE | Admit: 2022-12-19 | Discharge: 2022-12-19 | Disposition: A | Discharge status: Home / Self Care | Payer: Medicare Other | Source: Ambulatory Visit | Attending: Nurse Practitioner | Admitting: Nurse Practitioner

## 2022-12-19 DIAGNOSIS — Z433 Encounter for attention to colostomy: Secondary | ICD-10-CM | POA: Diagnosis not present

## 2022-12-19 DIAGNOSIS — Z933 Colostomy status: Secondary | ICD-10-CM | POA: Diagnosis present

## 2022-12-19 DIAGNOSIS — K9403 Colostomy malfunction: Secondary | ICD-10-CM

## 2022-12-19 NOTE — Progress Notes (Signed)
[facility]   Reason for visit:  LLQ colostomy, states she has some leaks at 3 and 9 o'clock. Has HH nursing in place and they have not performed any ostomy care with her.  They have been ordering supplies.  HPI:  Colovesical fistula with colectomy and Hartmann's procedure/colostomy Past Medical History:  Diagnosis Date   Arthritis    Asthma    as a child   Cancer (HCC)    GERD (gastroesophageal reflux disease)    Hypothyroidism    Pneumonia 1971   Thyroid nodule    No family history on file. Allergies  Allergen Reactions   Indomethacin Other (See Comments) and Palpitations   Current Outpatient Medications  Medication Sig Dispense Refill Last Dose   acetaminophen (TYLENOL) 500 MG tablet Take 2 tablets (1,000 mg total) by mouth every 6 (six) hours. 30 tablet 0    APPLE CIDER VINEGAR PO Take 1 tablet by mouth daily. 1 gummie daily      ascorbic acid (VITAMIN C) 1000 MG tablet Take 1,000 mg by mouth daily.       Calcium Carbonate-Vitamin D 600-200 MG-UNIT CAPS Take 1,200 mg by mouth daily. 30 capsule 0    folic acid (FOLVITE) 1 MG tablet Take 1 tablet (1 mg total) by mouth daily. 30 tablet 2    levothyroxine (SYNTHROID) 88 MCG tablet Take 88 mcg by mouth daily before breakfast. Takes only 1/2 tablet every Sunday      methotrexate (RHEUMATREX) 2.5 MG tablet Take 7.5 mg by mouth once a week. 7.5 mg once a week on Saturdays      Multiple Vitamin (MULTI-VITAMIN) tablet Take 1 tablet by mouth daily. Centrum      pantoprazole (PROTONIX) 40 MG tablet Take 40 mg by mouth daily.      TURMERIC PO Take 1,000 mg by mouth daily.      No current facility-administered medications for this encounter.   ROS  Review of Systems Vital signs:  BP (!) 170/91   Pulse 92   Temp 97.9 F (36.6 C)   SpO2 98%  Exam:  Physical Exam  Stoma type/location:  1 1/8" slightly budded Stomal assessment/size:  pink and moist  producing thick brown stool Peristomal assessment:  Contact  dermatitis from leaks and frequent pouch changes.  Treatment options for stomal/peristomal skin: Stoma powder (was not sealing with skin prep) barrier ring and 1 piece flat pouch Output: thick brown stool  intermittent thick pasty stools  takes miralax daily.   Ostomy pouching: 1pc.coloplast flat pouch, filtered Education provided:  encouraged to drink sufficient water to avoid constipation We discussed that the pouch should not be leaking and she should be able to wear 3-4 days.  She just applied a pouch at 1:30 AM and does not want to change today. We discussed the procedure for applying stoma powder and then sealing with skin prep.  Continue barrier ring. We may need to consider a convex pouch and an ostomy belt if leaks continue. She would like to try crusting and see if this solves her issue.     Impression/dx  *** Discussion  *** Plan  ***    Visit time: *** minutes.   Maple Hudson FNP-BC

## 2022-12-21 DIAGNOSIS — Z433 Encounter for attention to colostomy: Secondary | ICD-10-CM | POA: Insufficient documentation

## 2022-12-21 NOTE — Discharge Instructions (Signed)
See back in one week.  PRactice pouch change with home health nurse.  Seal in powder  with skin prep.

## 2022-12-26 ENCOUNTER — Ambulatory Visit (HOSPITAL_COMMUNITY)
Admission: RE | Admit: 2022-12-26 | Discharge: 2022-12-26 | Disposition: A | Discharge status: Home / Self Care | Payer: Medicare Other | Source: Ambulatory Visit | Attending: Nurse Practitioner | Admitting: Nurse Practitioner

## 2022-12-26 DIAGNOSIS — K9403 Colostomy malfunction: Secondary | ICD-10-CM | POA: Insufficient documentation

## 2022-12-26 DIAGNOSIS — N321 Vesicointestinal fistula: Secondary | ICD-10-CM | POA: Diagnosis not present

## 2022-12-26 DIAGNOSIS — Z433 Encounter for attention to colostomy: Secondary | ICD-10-CM | POA: Diagnosis not present

## 2022-12-26 DIAGNOSIS — L24B3 Irritant contact dermatitis related to fecal or urinary stoma or fistula: Secondary | ICD-10-CM | POA: Insufficient documentation

## 2022-12-26 NOTE — Discharge Instructions (Signed)
Patient needs:  1 piece soft convex pouch Stoma powder and skin prep  Adding barrier ring  Secure the edges with barrier strips

## 2022-12-26 NOTE — Progress Notes (Signed)
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 Arthritis   . Asthma    as a child  . Cancer (HCC)   . GERD (gastroesophageal reflux disease)   . Hypothyroidism   . Pneumonia 1971  . Thyroid nodule    No family history on file. Allergies  Allergen Reactions  . Indomethacin Other (See Comments) and Palpitations   Current Outpatient Medications  Medication Sig Dispense Refill Last Dose/Taking  . acetaminophen (TYLENOL) 500 MG tablet Take 2 tablets (1,000 mg total) by mouth every 6 (six) hours. 30 tablet 0   . APPLE CIDER VINEGAR PO Take 1 tablet by mouth daily. 1 gummie daily     . ascorbic acid (VITAMIN C) 1000 MG tablet Take 1,000 mg by mouth daily.      . Calcium Carbonate-Vitamin D 600-200 MG-UNIT CAPS Take 1,200 mg by mouth daily. 30 capsule 0   . folic acid (FOLVITE) 1 MG tablet Take 1 tablet (1 mg total) by mouth daily. 30 tablet 2   . levothyroxine (SYNTHROID) 88 MCG tablet Take 88 mcg by mouth daily before breakfast. Takes only 1/2 tablet every Sunday     . methotrexate (RHEUMATREX) 2.5 MG tablet Take 7.5 mg by mouth once a week. 7.5 mg once a week on Saturdays     . Multiple Vitamin (MULTI-VITAMIN) tablet Take 1 tablet by mouth daily. Centrum     . pantoprazole (PROTONIX) 40 MG tablet Take 40 mg by mouth daily.     . TURMERIC PO Take 1,000 mg by mouth daily.      No current facility-administered medications for this encounter.   ROS  Review of Systems  Gastrointestinal:        LLQ colostomy  Skin:        Peristomal irritation Creasing around stoma causing leaks.   Psychiatric/Behavioral:  The patient is nervous/anxious.        Still unsure about ostomy care.  WOrking with [facility] nurse  All other systems reviewed and are negative. Vital signs:  BP (!)  141/89 (BP Location: Right Arm)   Pulse 86   Temp 97.7 F (36.5 C) (Oral)   Resp 18   SpO2 98%  Exam:  Physical Exam Vitals reviewed.  Constitutional:      Appearance: Normal appearance.  Cardiovascular:     Rate and Rhythm: Normal rate and regular rhythm.  Abdominal:     Palpations: Abdomen is soft.  Skin:    General: Skin is dry.     Findings: Erythema present.  Neurological:     Mental Status: She is alert and oriented to person, place, and time.  Psychiatric:        Mood and Affect: Mood normal.        Behavior: Behavior normal.    Stoma type/location:  LLQ colostomy Stomal assessment/size:  1 1/2" slightly budded Peristomal assessment:  creasing at 3 and 9 o'clock  Treatment options for stomal/peristomal skin: barrier ring  1 piece convex  Output: soft brown stool Ostomy pouching: 1pc. Convex  adding barrier strips  does not want belt at this time.  Education provided:  performed pouch change with patient assistance  she agrees to ongoing teaching with [facility] nurse.  I will set her up with [employer] for supplies after [facility]  is out.     Impression/dx  colostomy Discussion  Some leaks.  Will implement 1 piece convex with barrier strips and barrier ring Plan  See back as needed.  Set up with [employer] for supplies.     Visit time: 45 minutes.   Mike Gip FNP-BC

## 2023-01-04 ENCOUNTER — Other Ambulatory Visit (HOSPITAL_COMMUNITY): Payer: Self-pay | Admitting: Nurse Practitioner

## 2023-01-04 DIAGNOSIS — L24B3 Irritant contact dermatitis related to fecal or urinary stoma or fistula: Secondary | ICD-10-CM | POA: Insufficient documentation

## 2023-01-04 DIAGNOSIS — K9400 Colostomy complication, unspecified: Secondary | ICD-10-CM

## 2023-01-08 ENCOUNTER — Ambulatory Visit (HOSPITAL_COMMUNITY): Payer: Medicare Other | Admitting: Nurse Practitioner

## 2023-10-22 ENCOUNTER — Encounter: Payer: Self-pay | Admitting: Surgery

## 2023-10-22 ENCOUNTER — Ambulatory Visit (INDEPENDENT_AMBULATORY_CARE_PROVIDER_SITE_OTHER): Admitting: Surgery

## 2023-10-22 VITALS — BP 104/69 | HR 95 | Ht 62.0 in | Wt 114.2 lb

## 2023-10-22 DIAGNOSIS — K5732 Diverticulitis of large intestine without perforation or abscess without bleeding: Secondary | ICD-10-CM

## 2023-10-22 DIAGNOSIS — Z433 Encounter for attention to colostomy: Secondary | ICD-10-CM

## 2023-10-22 DIAGNOSIS — R634 Abnormal weight loss: Secondary | ICD-10-CM | POA: Diagnosis not present

## 2023-10-22 DIAGNOSIS — R630 Anorexia: Secondary | ICD-10-CM

## 2023-10-22 DIAGNOSIS — K9400 Colostomy complication, unspecified: Secondary | ICD-10-CM

## 2023-10-22 NOTE — Progress Notes (Signed)
 Outpatient Surgical Follow Up  10/22/2023  [name] is an 89 y.o. female.   Chief Complaint  Patient presents with   Follow-up    HPI: 75-year-old female history of Hartman's procedure he about a year ago for diverticulitis with colovesical fistula.  She has recovered well.  Colostomy is working.  She is ambulating she is eating.  She empties her colostomy couple times a week.  She is potentially interested in reversal.  She was diagnosed with rheumatoid arthritis and it is on methotrexate.  She works with a walker.  She comes accompanied by her family. Endorses decreased appetite and some weight loss.  Past Medical History:  Diagnosis Date   Arthritis    Asthma    as a child   Cancer (HCC)    GERD (gastroesophageal reflux disease)    Hypothyroidism    Pneumonia 1971   Thyroid  nodule     Past Surgical History:  Procedure Laterality Date   ABDOMINAL HYSTERECTOMY     CATARACT EXTRACTION, BILATERAL     COLECTOMY WITH COLOSTOMY CREATION/HARTMANN PROCEDURE N/A 10/13/2022   Procedure: COLECTOMY WITH COLOSTOMY CREATION/HARTMANN PROCEDURE With REPAIR OF BOWEL;  Surgeon: Jordis Laneta FALCON, MD;  Location: [facility] ORS;  Service: General;  Laterality: N/A;   IR NEPHROSTOGRAM LEFT THRU EXISTING ACCESS  11/23/2022   IR NEPHROSTOMY PLACEMENT LEFT  10/11/2022   KNEE ARTHROPLASTY Left 03/24/2022   Procedure: COMPUTER ASSISTED TOTAL KNEE ARTHROPLASTY;  Surgeon: Mardee Lynwood SQUIBB, MD;  Location: [facility] ORS;  Service: Orthopedics;  Laterality: Left;   thyroid  gland removed  1977   THYROIDECTOMY, PARTIAL Left     No pertinent family history  Social History:  reports that she quit smoking about 20 years ago. Her smoking use included cigarettes. She started smoking about 62 years ago. She has a 42 pack-year smoking history. She has been exposed to tobacco smoke. She has never used smokeless tobacco. She reports that she does not drink alcohol and does not use drugs.  Allergies:  Allergies  Allergen  Reactions   Indomethacin Other (See Comments) and Palpitations    Medications reviewed.    ROS Full ROS performed and is otherwise negative other than what is stated in HPI   BP 104/69   Pulse 95   Ht 5' 2 (1.575 m)   Wt 114 lb 3.2 oz (51.8 kg)   SpO2 96%   BMI 20.89 kg/m   Physical Exam Vitals and nursing note reviewed. Exam conducted with a chaperone present.  Constitutional:      General: She is not in acute distress.    Appearance: Normal appearance. She is not ill-appearing.  Cardiovascular:     Rate and Rhythm: Normal rate and regular rhythm.  Pulmonary:     Effort: Pulmonary effort is normal. No respiratory distress.     Breath sounds: Normal breath sounds. No stridor.  Abdominal:     General: Abdomen is flat. There is no distension.     Palpations: Abdomen is soft. There is no mass.     Tenderness: There is no abdominal tenderness. There is no guarding or rebound.     Hernia: No hernia is present.     Comments: ostomy replace patent  Musculoskeletal:        General: Normal range of motion.     Cervical back: Normal range of motion and neck supple. No rigidity or tenderness.  Skin:    General: Skin is warm and dry.     Capillary Refill: Capillary refill  takes less than 2 seconds.  Neurological:     General: No focal deficit present.     Mental Status: She is alert and oriented to person, place, and time.  Psychiatric:        Mood and Affect: Mood normal.        Behavior: Behavior normal.        Thought Content: Thought content normal.        Judgment: Judgment normal.    Assessment/Plan: 89 year old female status post Hartmann procedure last year [place] close to 4 potential evaluation of reversal.  She does have the send cardiovascular reserve however she is still 66.  Discussed with the patient and family at length about what a colostomy reversal will entail.  The chances of anastomotic leak and also the significant morbidity associated with anastomotic  leak on elderly and debilitated patients. He does have some weight loss and decreased appetite and we will interrogate this with a CT scan of the abdomen pelvis with contrast.  I will plan to see her 1 more time in about a month and at that time we will visit potential surgical discussion along with CT scans. I personally spent a total of 40 minutes in the care of the patient today including performing a medically appropriate exam/evaluation, counseling and educating, placing orders, referring and communicating with other health care professionals, documenting clinical information in the EHR, independently interpreting and reviewing images studies and coordinating care.   Laneta Luna, MD [facility] General Surgeon

## 2023-10-22 NOTE — Patient Instructions (Signed)
 We will get you scheduled for a CT abdomen and pelvis with contrast.  We have scheduled you for a CT Scan of your Abdomen and Pelvis with contrast. This has been scheduled at [facility] on 10/30/23. Please arrive there by 12:45 pm and enter in through the [facility]. If you need to reschedule your Scan, you may do so by calling [phone]. Please let us  know if you reschedule your scan as we have to get authorization from your insurance for this.      Follow-up with our office in 1 month.  Please call and ask to speak with a nurse if you develop questions or concerns.

## 2023-10-30 ENCOUNTER — Ambulatory Visit
Admission: RE | Admit: 2023-10-30 | Discharge: 2023-10-30 | Disposition: A | Discharge status: Home / Self Care | Source: Ambulatory Visit | Attending: Surgery | Admitting: Surgery

## 2023-10-30 DIAGNOSIS — K632 Fistula of intestine: Secondary | ICD-10-CM | POA: Diagnosis present

## 2023-10-30 DIAGNOSIS — K9400 Colostomy complication, unspecified: Secondary | ICD-10-CM | POA: Insufficient documentation

## 2023-10-30 DIAGNOSIS — K5732 Diverticulitis of large intestine without perforation or abscess without bleeding: Secondary | ICD-10-CM | POA: Insufficient documentation

## 2023-10-30 MED ORDER — IOHEXOL 300 MG/ML  SOLN
80.0000 mL | Freq: Once | INTRAMUSCULAR | Status: AC | PRN
  Administered 2023-10-30: 80 mL via INTRAVENOUS

## 2023-11-05 ENCOUNTER — Ambulatory Visit: Payer: Self-pay

## 2023-11-05 NOTE — Telephone Encounter (Signed)
-----   Message from [facility] sent at 11/02/2023  2:00 PM EDT ----- Please let her know the ct did not show any pathology or issues, please keep f/u appt w me ----- Message ----- From: Interface, Rad Results In Sent: 11/01/2023   6:24 PM EDT To: Laneta JULIANNA Luna, MD

## 2023-11-05 NOTE — Telephone Encounter (Signed)
 Notified patient as instructed, patient pleased. Discussed follow-up appointments, patient agrees

## 2023-11-26 ENCOUNTER — Ambulatory Visit: Admitting: Surgery
